# Patient Record
Sex: Female | Born: 1940 | Race: Black or African American | Hispanic: No | Smoking: Former smoker
Health system: Southern US, Community
[De-identification: ages and names within clinical notes are randomized; demographics above are authoritative.]

## PROBLEM LIST (undated history)

## (undated) DIAGNOSIS — E119 Type 2 diabetes mellitus without complications: Secondary | ICD-10-CM

## (undated) DIAGNOSIS — I10 Essential (primary) hypertension: Secondary | ICD-10-CM

## (undated) DIAGNOSIS — J189 Pneumonia, unspecified organism: Secondary | ICD-10-CM

## (undated) DIAGNOSIS — O2230 Deep phlebothrombosis in pregnancy, unspecified trimester: Secondary | ICD-10-CM

## (undated) DIAGNOSIS — N289 Disorder of kidney and ureter, unspecified: Secondary | ICD-10-CM

## (undated) HISTORY — PX: OTHER SURGICAL HISTORY: SHX169

---

## 2004-11-01 ENCOUNTER — Emergency Department (HOSPITAL_COMMUNITY): Admission: EM | Admit: 2004-11-01 | Discharge: 2004-11-01 | Payer: Self-pay | Admitting: Emergency Medicine

## 2006-05-16 ENCOUNTER — Encounter: Admission: RE | Admit: 2006-05-16 | Discharge: 2006-05-16 | Payer: Self-pay | Admitting: Internal Medicine

## 2006-07-12 ENCOUNTER — Ambulatory Visit (HOSPITAL_COMMUNITY): Admission: RE | Admit: 2006-07-12 | Discharge: 2006-07-12 | Payer: Self-pay | Admitting: Gastroenterology

## 2006-07-12 ENCOUNTER — Encounter (INDEPENDENT_AMBULATORY_CARE_PROVIDER_SITE_OTHER): Payer: Self-pay | Admitting: Specialist

## 2009-08-10 ENCOUNTER — Inpatient Hospital Stay (HOSPITAL_COMMUNITY): Admission: RE | Admit: 2009-08-10 | Discharge: 2009-08-14 | Payer: Self-pay | Admitting: Urology

## 2010-03-21 ENCOUNTER — Encounter: Payer: Self-pay | Admitting: Urology

## 2010-05-16 LAB — GLUCOSE, CAPILLARY
Glucose-Capillary: 107 mg/dL — ABNORMAL HIGH (ref 70–99)
Glucose-Capillary: 111 mg/dL — ABNORMAL HIGH (ref 70–99)
Glucose-Capillary: 127 mg/dL — ABNORMAL HIGH (ref 70–99)
Glucose-Capillary: 82 mg/dL (ref 70–99)
Glucose-Capillary: 92 mg/dL (ref 70–99)
Glucose-Capillary: 92 mg/dL (ref 70–99)

## 2010-05-17 LAB — COMPREHENSIVE METABOLIC PANEL
Albumin: 4.3 g/dL (ref 3.5–5.2)
BUN: 17 mg/dL (ref 6–23)
CO2: 34 mEq/L — ABNORMAL HIGH (ref 19–32)
Calcium: 10.7 mg/dL — ABNORMAL HIGH (ref 8.4–10.5)
Chloride: 101 mEq/L (ref 96–112)
Creatinine, Ser: 1.05 mg/dL (ref 0.4–1.2)
GFR calc Af Amer: >60 mL/min (ref >60)
GFR calc non Af Amer: 52 mL/min — ABNORMAL LOW (ref >60)
Glucose, Bld: 84 mg/dL (ref 70–99)
Potassium: 4.5 mEq/L (ref 3.5–5.1)
Sodium: 143 mEq/L (ref 135–145)

## 2010-05-17 LAB — GLUCOSE, CAPILLARY
Glucose-Capillary: 109 mg/dL — ABNORMAL HIGH (ref 70–99)
Glucose-Capillary: 112 mg/dL — ABNORMAL HIGH (ref 70–99)
Glucose-Capillary: 153 mg/dL — ABNORMAL HIGH (ref 70–99)

## 2010-05-17 LAB — TYPE AND SCREEN: Antibody Screen: NEGATIVE

## 2010-05-17 LAB — BASIC METABOLIC PANEL
CO2: 26 mEq/L (ref 19–32)
Chloride: 103 mEq/L (ref 96–112)
GFR calc Af Amer: 58 mL/min — ABNORMAL LOW (ref >60)
Potassium: 4.3 mEq/L (ref 3.5–5.1)
Sodium: 134 mEq/L — ABNORMAL LOW (ref 135–145)

## 2010-05-17 LAB — CBC
HCT: 33.8 % — ABNORMAL LOW (ref 36.0–46.0)
HCT: 38.7 % (ref 36.0–46.0)
Hemoglobin: 11.2 g/dL — ABNORMAL LOW (ref 12.0–15.0)
Hemoglobin: 12.4 g/dL (ref 12.0–15.0)
MCHC: 33 g/dL (ref 30.0–36.0)
MCV: 86.4 fL (ref 78.0–100.0)
RBC: 3.92 MIL/uL (ref 3.87–5.11)
RBC: 4.42 MIL/uL (ref 3.87–5.11)
RDW: 13.4 % (ref 11.5–15.5)

## 2010-05-17 LAB — ABO/RH: ABO/RH(D): O POS

## 2010-07-13 NOTE — Op Note (Signed)
[name], Glenda Blankenship                ACCOUNT NO.:  [id number]   MEDICAL RECORD NO.:  [id number]          PATIENT TYPE:  AMB   LOCATION:  ENDO                         FACILITY:  [facility]   PHYSICIAN:  Anselmo Rod, M.D.  DATE OF BIRTH:  1940/12/22   DATE OF PROCEDURE:  07/12/2006  DATE OF DISCHARGE:                               OPERATIVE REPORT   PROCEDURE PERFORMED:  Colonoscopy with snare polypectomy x2.   ENDOSCOPIST:  Anselmo Rod, M.D.   INSTRUMENT USED:  Pentax video colonoscope.   INDICATIONS FOR PROCEDURE:  The patient is a 70 year old African-  American female undergoing screening colonoscopy.  Rule out colonic  polyps, masses, etc.   PREPROCEDURE PREPARATION:  Informed consent was procured from the  patient.  The patient was fasted for eight hours prior to the procedure  and prepped with a gallon of Nulytely the night prior to the procedure.  The risks and benefits of the procedure including a 10% miss rate for  cancer or polyps was discussed with the patient as well.   PREPROCEDURE PHYSICAL:  The patient had stable vital signs.  Neck  supple.  Chest clear to auscultation.  S1 and S2 regular.  No murmurs,  rubs, gallops, rales, rhonchi or wheezing.  Abdomen soft with normal  bowel sounds.   DESCRIPTION OF PROCEDURE:  The patient was placed in left lateral  decubitus position and sedated with 70 mcg of Fentanyl and 7 mg of  Versed in slow incremental doses.  Once the patient was adequately  sedated and maintained on low flow oxygen and continuous cardiac  monitoring, the Pentax video colonoscope was advanced from the rectum to  the cecum.  The appendicular orifice and ileocecal valve were clearly  visualized and photographed.  Two small sessile polyps were removed by  hot snare from the proximal right colon.  The rest of the exam was  unremarkable.  There was some residual stool in the colon and multiple  washes were done.  Retroflexion in rectum revealed no  abnormalities.   IMPRESSION:  1. Two small sessile polyps removed by hot snare from proximal right      colon.  Otherwise normal exam to cecum.  2. There was some residual stool in the colon, very small lesions      could be missed.  Multiple washes were done.  3. No evidence of diverticulosis.   RECOMMENDATIONS:  1. Await pathology results.  2. Avoid all nonsteroidals including aspirin for the next two weeks.  3. Repeat colonoscopy depending on pathology results.  4. Outpatient followup as need arises in the future.      Anselmo Rod, M.D.  Electronically Signed     JNM/MEDQ  D:  07/12/2006  T:  07/12/2006  Job:  [id number]   cc:   Olene Craven, M.D.

## 2014-03-24 DIAGNOSIS — E1121 Type 2 diabetes mellitus with diabetic nephropathy: Secondary | ICD-10-CM | POA: Diagnosis not present

## 2014-03-24 DIAGNOSIS — I129 Hypertensive chronic kidney disease with stage 1 through stage 4 chronic kidney disease, or unspecified chronic kidney disease: Secondary | ICD-10-CM | POA: Diagnosis not present

## 2014-03-24 DIAGNOSIS — N08 Glomerular disorders in diseases classified elsewhere: Secondary | ICD-10-CM | POA: Diagnosis not present

## 2014-03-24 DIAGNOSIS — Z79899 Other long term (current) drug therapy: Secondary | ICD-10-CM | POA: Diagnosis not present

## 2014-03-24 DIAGNOSIS — N182 Chronic kidney disease, stage 2 (mild): Secondary | ICD-10-CM | POA: Diagnosis not present

## 2016-01-07 ENCOUNTER — Emergency Department (HOSPITAL_COMMUNITY): Payer: Medicare Other

## 2016-01-07 ENCOUNTER — Encounter (HOSPITAL_COMMUNITY): Payer: Self-pay

## 2016-01-07 ENCOUNTER — Inpatient Hospital Stay (HOSPITAL_COMMUNITY)
Admission: EM | Admit: 2016-01-07 | Discharge: 2016-01-14 | DRG: 871 | Disposition: A | Discharge status: Home / Self Care | Payer: Medicare Other | Attending: Pulmonary Disease | Admitting: Pulmonary Disease

## 2016-01-07 DIAGNOSIS — Z87891 Personal history of nicotine dependence: Secondary | ICD-10-CM

## 2016-01-07 DIAGNOSIS — A419 Sepsis, unspecified organism: Secondary | ICD-10-CM | POA: Diagnosis not present

## 2016-01-07 DIAGNOSIS — I272 Pulmonary hypertension, unspecified: Secondary | ICD-10-CM

## 2016-01-07 DIAGNOSIS — I959 Hypotension, unspecified: Secondary | ICD-10-CM | POA: Diagnosis not present

## 2016-01-07 DIAGNOSIS — I214 Non-ST elevation (NSTEMI) myocardial infarction: Secondary | ICD-10-CM | POA: Diagnosis present

## 2016-01-07 DIAGNOSIS — J9601 Acute respiratory failure with hypoxia: Secondary | ICD-10-CM | POA: Diagnosis not present

## 2016-01-07 DIAGNOSIS — Z66 Do not resuscitate: Secondary | ICD-10-CM | POA: Diagnosis present

## 2016-01-07 DIAGNOSIS — R6521 Severe sepsis with septic shock: Secondary | ICD-10-CM | POA: Diagnosis present

## 2016-01-07 DIAGNOSIS — Z7982 Long term (current) use of aspirin: Secondary | ICD-10-CM

## 2016-01-07 DIAGNOSIS — N179 Acute kidney failure, unspecified: Secondary | ICD-10-CM

## 2016-01-07 DIAGNOSIS — A084 Viral intestinal infection, unspecified: Secondary | ICD-10-CM | POA: Diagnosis present

## 2016-01-07 DIAGNOSIS — E872 Acidosis, unspecified: Secondary | ICD-10-CM

## 2016-01-07 DIAGNOSIS — I82441 Acute embolism and thrombosis of right tibial vein: Secondary | ICD-10-CM | POA: Diagnosis present

## 2016-01-07 DIAGNOSIS — R579 Shock, unspecified: Secondary | ICD-10-CM | POA: Diagnosis present

## 2016-01-07 DIAGNOSIS — E1165 Type 2 diabetes mellitus with hyperglycemia: Secondary | ICD-10-CM | POA: Diagnosis present

## 2016-01-07 DIAGNOSIS — J9600 Acute respiratory failure, unspecified whether with hypoxia or hypercapnia: Secondary | ICD-10-CM

## 2016-01-07 DIAGNOSIS — E876 Hypokalemia: Secondary | ICD-10-CM | POA: Diagnosis not present

## 2016-01-07 DIAGNOSIS — I82431 Acute embolism and thrombosis of right popliteal vein: Secondary | ICD-10-CM | POA: Diagnosis present

## 2016-01-07 DIAGNOSIS — I2602 Saddle embolus of pulmonary artery with acute cor pulmonale: Secondary | ICD-10-CM

## 2016-01-07 DIAGNOSIS — E8729 Other acidosis: Secondary | ICD-10-CM

## 2016-01-07 DIAGNOSIS — R0600 Dyspnea, unspecified: Secondary | ICD-10-CM

## 2016-01-07 DIAGNOSIS — K529 Noninfective gastroenteritis and colitis, unspecified: Secondary | ICD-10-CM

## 2016-01-07 DIAGNOSIS — R571 Hypovolemic shock: Secondary | ICD-10-CM | POA: Diagnosis present

## 2016-01-07 DIAGNOSIS — E875 Hyperkalemia: Secondary | ICD-10-CM

## 2016-01-07 DIAGNOSIS — M109 Gout, unspecified: Secondary | ICD-10-CM | POA: Diagnosis present

## 2016-01-07 DIAGNOSIS — J90 Pleural effusion, not elsewhere classified: Secondary | ICD-10-CM

## 2016-01-07 DIAGNOSIS — I82409 Acute embolism and thrombosis of unspecified deep veins of unspecified lower extremity: Secondary | ICD-10-CM

## 2016-01-07 DIAGNOSIS — I313 Pericardial effusion (noninflammatory): Secondary | ICD-10-CM | POA: Diagnosis not present

## 2016-01-07 DIAGNOSIS — Z9889 Other specified postprocedural states: Secondary | ICD-10-CM

## 2016-01-07 DIAGNOSIS — D6489 Other specified anemias: Secondary | ICD-10-CM | POA: Diagnosis present

## 2016-01-07 DIAGNOSIS — I10 Essential (primary) hypertension: Secondary | ICD-10-CM | POA: Diagnosis present

## 2016-01-07 DIAGNOSIS — E8770 Fluid overload, unspecified: Secondary | ICD-10-CM | POA: Diagnosis not present

## 2016-01-07 HISTORY — DX: Type 2 diabetes mellitus without complications: E11.9

## 2016-01-07 LAB — CBC WITH DIFFERENTIAL/PLATELET
BASOS ABS: 0 10*3/uL (ref 0.0–0.1)
BASOS PCT: 0 %
EOS ABS: 0 10*3/uL (ref 0.0–0.7)
EOS PCT: 0 %
HCT: 35.7 % — ABNORMAL LOW (ref 36.0–46.0)
Hemoglobin: 11 g/dL — ABNORMAL LOW (ref 12.0–15.0)
Lymphocytes Relative: 26 %
Lymphs Abs: 2.2 10*3/uL (ref 0.7–4.0)
MCH: 28.1 pg (ref 26.0–34.0)
MCHC: 30.8 g/dL (ref 30.0–36.0)
MCV: 91.3 fL (ref 78.0–100.0)
Monocytes Absolute: 0.6 10*3/uL (ref 0.1–1.0)
Monocytes Relative: 8 %
Neutro Abs: 5.5 10*3/uL (ref 1.7–7.7)
Neutrophils Relative %: 66 %
PLATELETS: 192 10*3/uL (ref 150–400)
RBC: 3.91 MIL/uL (ref 3.87–5.11)
RDW: 14.1 % (ref 11.5–15.5)
WBC: 8.3 10*3/uL (ref 4.0–10.5)

## 2016-01-07 LAB — CBG MONITORING, ED
GLUCOSE-CAPILLARY: 110 mg/dL — AB (ref 65–99)
GLUCOSE-CAPILLARY: 156 mg/dL — AB (ref 65–99)

## 2016-01-07 LAB — BLOOD GAS, VENOUS
ACID-BASE DEFICIT: 5.5 mmol/L — AB (ref 0.0–2.0)
BICARBONATE: 21.3 mmol/L (ref 20.0–28.0)
FIO2: 100
O2 Content: 15 L/min
O2 Saturation: 9.8 %
Patient temperature: 101.6
pCO2, Ven: 55.1 mmHg (ref 44.0–60.0)
pH, Ven: 7.224 — ABNORMAL LOW (ref 7.250–7.430)

## 2016-01-07 LAB — COMPREHENSIVE METABOLIC PANEL
ALT: 26 U/L (ref 14–54)
AST: 53 U/L — AB (ref 15–41)
Albumin: 3.5 g/dL (ref 3.5–5.0)
Alkaline Phosphatase: 66 U/L (ref 38–126)
Anion gap: 9 (ref 5–15)
BILIRUBIN TOTAL: 0.5 mg/dL (ref 0.3–1.2)
BUN: 25 mg/dL — AB (ref 6–20)
CO2: 22 mmol/L (ref 22–32)
CREATININE: 2.32 mg/dL — AB (ref 0.44–1.00)
Calcium: 8.5 mg/dL — ABNORMAL LOW (ref 8.9–10.3)
Chloride: 108 mmol/L (ref 101–111)
GFR calc Af Amer: 23 mL/min — ABNORMAL LOW (ref >60)
GFR, EST NON AFRICAN AMERICAN: 19 mL/min — AB (ref >60)
Glucose, Bld: 165 mg/dL — ABNORMAL HIGH (ref 65–99)
POTASSIUM: 5.6 mmol/L — AB (ref 3.5–5.1)
Sodium: 139 mmol/L (ref 135–145)
TOTAL PROTEIN: 6.9 g/dL (ref 6.5–8.1)

## 2016-01-07 LAB — PROTIME-INR
INR: 1.08
Prothrombin Time: 14 seconds (ref 11.4–15.2)

## 2016-01-07 LAB — TYPE AND SCREEN
ABO/RH(D): O POS
ANTIBODY SCREEN: NEGATIVE

## 2016-01-07 LAB — I-STAT CG4 LACTIC ACID, ED: LACTIC ACID, VENOUS: 5.68 mmol/L — AB (ref 0.5–1.9)

## 2016-01-07 MED ORDER — SODIUM CHLORIDE 0.9 % IV BOLUS (SEPSIS)
3000.0000 mL | Freq: Once | INTRAVENOUS | Status: AC
  Administered 2016-01-07: 3000 mL via INTRAVENOUS

## 2016-01-07 MED ORDER — LACTATED RINGERS IV BOLUS (SEPSIS)
1000.0000 mL | Freq: Once | INTRAVENOUS | Status: AC
  Administered 2016-01-08: 1000 mL via INTRAVENOUS

## 2016-01-07 MED ORDER — CEFTRIAXONE SODIUM 1 G IJ SOLR
1.0000 g | Freq: Once | INTRAMUSCULAR | Status: AC
  Administered 2016-01-07: 1 g via INTRAVENOUS
  Filled 2016-01-07: qty 10

## 2016-01-07 MED ORDER — ACETAMINOPHEN 650 MG RE SUPP
650.0000 mg | Freq: Once | RECTAL | Status: AC
  Administered 2016-01-07: 650 mg via RECTAL
  Filled 2016-01-07: qty 1

## 2016-01-07 MED ORDER — ONDANSETRON HCL 4 MG/2ML IJ SOLN: 4.0000 mg | Freq: Once | INTRAMUSCULAR | Status: DC | PRN

## 2016-01-07 NOTE — ED Notes (Signed)
Family at bedside. MD to come and update.

## 2016-01-07 NOTE — ED Notes (Signed)
ED Glenda Blankenship at bedside. 

## 2016-01-07 NOTE — ED Triage Notes (Signed)
BIB EMS from Home w/ c/o emesis, diarrhea, and hypotension. EMS reports systolic palpated of 70.

## 2016-01-07 NOTE — ED Notes (Signed)
Bed: WA21 Expected date:  Expected time:  Means of arrival:  Comments: EMS nausea and vomiting IVF's

## 2016-01-08 ENCOUNTER — Inpatient Hospital Stay (HOSPITAL_COMMUNITY): Payer: Medicare Other

## 2016-01-08 DIAGNOSIS — I82431 Acute embolism and thrombosis of right popliteal vein: Secondary | ICD-10-CM | POA: Diagnosis present

## 2016-01-08 DIAGNOSIS — R571 Hypovolemic shock: Secondary | ICD-10-CM

## 2016-01-08 DIAGNOSIS — J9601 Acute respiratory failure with hypoxia: Secondary | ICD-10-CM | POA: Diagnosis not present

## 2016-01-08 DIAGNOSIS — E1165 Type 2 diabetes mellitus with hyperglycemia: Secondary | ICD-10-CM | POA: Diagnosis present

## 2016-01-08 DIAGNOSIS — A084 Viral intestinal infection, unspecified: Secondary | ICD-10-CM | POA: Diagnosis present

## 2016-01-08 DIAGNOSIS — I313 Pericardial effusion (noninflammatory): Secondary | ICD-10-CM | POA: Diagnosis not present

## 2016-01-08 DIAGNOSIS — I272 Pulmonary hypertension, unspecified: Secondary | ICD-10-CM | POA: Diagnosis present

## 2016-01-08 DIAGNOSIS — Z66 Do not resuscitate: Secondary | ICD-10-CM | POA: Diagnosis present

## 2016-01-08 DIAGNOSIS — R579 Shock, unspecified: Secondary | ICD-10-CM | POA: Diagnosis present

## 2016-01-08 DIAGNOSIS — K529 Noninfective gastroenteritis and colitis, unspecified: Secondary | ICD-10-CM | POA: Diagnosis not present

## 2016-01-08 DIAGNOSIS — E8729 Other acidosis: Secondary | ICD-10-CM

## 2016-01-08 DIAGNOSIS — N179 Acute kidney failure, unspecified: Secondary | ICD-10-CM

## 2016-01-08 DIAGNOSIS — M109 Gout, unspecified: Secondary | ICD-10-CM | POA: Diagnosis present

## 2016-01-08 DIAGNOSIS — R0600 Dyspnea, unspecified: Secondary | ICD-10-CM | POA: Diagnosis not present

## 2016-01-08 DIAGNOSIS — J90 Pleural effusion, not elsewhere classified: Secondary | ICD-10-CM | POA: Diagnosis not present

## 2016-01-08 DIAGNOSIS — Z7982 Long term (current) use of aspirin: Secondary | ICD-10-CM | POA: Diagnosis not present

## 2016-01-08 DIAGNOSIS — R6521 Severe sepsis with septic shock: Secondary | ICD-10-CM | POA: Diagnosis present

## 2016-01-08 DIAGNOSIS — E876 Hypokalemia: Secondary | ICD-10-CM | POA: Diagnosis not present

## 2016-01-08 DIAGNOSIS — I2602 Saddle embolus of pulmonary artery with acute cor pulmonale: Secondary | ICD-10-CM | POA: Diagnosis present

## 2016-01-08 DIAGNOSIS — E872 Acidosis, unspecified: Secondary | ICD-10-CM

## 2016-01-08 DIAGNOSIS — D6489 Other specified anemias: Secondary | ICD-10-CM | POA: Diagnosis present

## 2016-01-08 DIAGNOSIS — I82491 Acute embolism and thrombosis of other specified deep vein of right lower extremity: Secondary | ICD-10-CM | POA: Diagnosis not present

## 2016-01-08 DIAGNOSIS — I214 Non-ST elevation (NSTEMI) myocardial infarction: Secondary | ICD-10-CM | POA: Diagnosis not present

## 2016-01-08 DIAGNOSIS — A419 Sepsis, unspecified organism: Secondary | ICD-10-CM | POA: Diagnosis present

## 2016-01-08 DIAGNOSIS — I959 Hypotension, unspecified: Secondary | ICD-10-CM | POA: Diagnosis present

## 2016-01-08 DIAGNOSIS — I2609 Other pulmonary embolism with acute cor pulmonale: Secondary | ICD-10-CM | POA: Diagnosis not present

## 2016-01-08 DIAGNOSIS — E8770 Fluid overload, unspecified: Secondary | ICD-10-CM | POA: Diagnosis not present

## 2016-01-08 DIAGNOSIS — I2699 Other pulmonary embolism without acute cor pulmonale: Secondary | ICD-10-CM | POA: Diagnosis not present

## 2016-01-08 DIAGNOSIS — E875 Hyperkalemia: Secondary | ICD-10-CM | POA: Diagnosis present

## 2016-01-08 DIAGNOSIS — I82441 Acute embolism and thrombosis of right tibial vein: Secondary | ICD-10-CM | POA: Diagnosis present

## 2016-01-08 DIAGNOSIS — I10 Essential (primary) hypertension: Secondary | ICD-10-CM | POA: Diagnosis present

## 2016-01-08 DIAGNOSIS — Z87891 Personal history of nicotine dependence: Secondary | ICD-10-CM | POA: Diagnosis not present

## 2016-01-08 DIAGNOSIS — R0602 Shortness of breath: Secondary | ICD-10-CM | POA: Diagnosis not present

## 2016-01-08 LAB — GLUCOSE, CAPILLARY
Glucose-Capillary: 100 mg/dL — ABNORMAL HIGH (ref 65–99)
Glucose-Capillary: 120 mg/dL — ABNORMAL HIGH (ref 65–99)
Glucose-Capillary: 158 mg/dL — ABNORMAL HIGH (ref 65–99)
Glucose-Capillary: 161 mg/dL — ABNORMAL HIGH (ref 65–99)
Glucose-Capillary: 76 mg/dL (ref 65–99)
Glucose-Capillary: 92 mg/dL (ref 65–99)

## 2016-01-08 LAB — PHOSPHORUS: Phosphorus: 2.8 mg/dL (ref 2.5–4.6)

## 2016-01-08 LAB — GASTROINTESTINAL PANEL BY PCR, STOOL (REPLACES STOOL CULTURE)

## 2016-01-08 LAB — BASIC METABOLIC PANEL
Anion gap: 16 — ABNORMAL HIGH (ref 5–15)
Anion gap: 12 (ref 5–15)
BUN: 26 mg/dL — AB (ref 6–20)
BUN: 28 mg/dL — AB (ref 6–20)
CALCIUM: 7.6 mg/dL — AB (ref 8.9–10.3)
CO2: 16 mmol/L — ABNORMAL LOW (ref 22–32)
CO2: 21 mmol/L — ABNORMAL LOW (ref 22–32)
CREATININE: 2.38 mg/dL — AB (ref 0.44–1.00)
Calcium: 8.2 mg/dL — ABNORMAL LOW (ref 8.9–10.3)
Chloride: 109 mmol/L (ref 101–111)
Chloride: 108 mmol/L (ref 101–111)
Creatinine, Ser: 2.32 mg/dL — ABNORMAL HIGH (ref 0.44–1.00)
GFR calc Af Amer: 23 mL/min — ABNORMAL LOW (ref >60)
GFR calc Af Amer: 22 mL/min — ABNORMAL LOW (ref >60)
GFR, EST NON AFRICAN AMERICAN: 19 mL/min — AB (ref >60)
GFR, EST NON AFRICAN AMERICAN: 19 mL/min — AB (ref >60)
GLUCOSE: 116 mg/dL — AB (ref 65–99)
GLUCOSE: 148 mg/dL — AB (ref 65–99)
POTASSIUM: 5.8 mmol/L — AB (ref 3.5–5.1)
Potassium: 4.6 mmol/L (ref 3.5–5.1)
Sodium: 141 mmol/L (ref 135–145)
Sodium: 141 mmol/L (ref 135–145)

## 2016-01-08 LAB — I-STAT CG4 LACTIC ACID, ED: LACTIC ACID, VENOUS: 9.11 mmol/L — AB (ref 0.5–1.9)

## 2016-01-08 LAB — CBC
HCT: 36.9 % (ref 36.0–46.0)
Hemoglobin: 11.4 g/dL — ABNORMAL LOW (ref 12.0–15.0)
MCH: 28 pg (ref 26.0–34.0)
MCHC: 30.9 g/dL (ref 30.0–36.0)
MCV: 90.7 fL (ref 78.0–100.0)
PLATELETS: 135 10*3/uL — AB (ref 150–400)
RBC: 4.07 MIL/uL (ref 3.87–5.11)
RDW: 14 % (ref 11.5–15.5)
WBC: 9.8 10*3/uL (ref 4.0–10.5)

## 2016-01-08 LAB — LACTIC ACID, PLASMA
LACTIC ACID, VENOUS: 10.7 mmol/L — AB (ref 0.5–1.9)
LACTIC ACID, VENOUS: 4.5 mmol/L — AB (ref 0.5–1.9)
LACTIC ACID, VENOUS: 2.9 mmol/L — AB (ref 0.5–1.9)

## 2016-01-08 LAB — TROPONIN I
TROPONIN I: 2.11 ng/mL — AB (ref <0.03)
TROPONIN I: 2.14 ng/mL — AB (ref <0.03)
Troponin I: 2.7 ng/mL (ref <0.03)

## 2016-01-08 LAB — BLOOD GAS, VENOUS
Acid-base deficit: 13.8 mmol/L — ABNORMAL HIGH (ref 0.0–2.0)
Bicarbonate: 15.6 mmol/L — ABNORMAL LOW (ref 20.0–28.0)
O2 Content: 2 L/min
O2 SAT: 23.7 %
PCO2 VEN: 52.2 mmHg (ref 44.0–60.0)
Patient temperature: 98.6
pH, Ven: 7.102 — CL (ref 7.250–7.430)

## 2016-01-08 LAB — C DIFFICILE QUICK SCREEN W PCR REFLEX
C DIFFICILE (CDIFF) TOXIN: NEGATIVE
C DIFFICLE (CDIFF) ANTIGEN: NEGATIVE
C Diff interpretation: NOT DETECTED

## 2016-01-08 LAB — PROCALCITONIN: Procalcitonin: 1.84 ng/mL

## 2016-01-08 LAB — MAGNESIUM: Magnesium: 1.5 mg/dL — ABNORMAL LOW (ref 1.7–2.4)

## 2016-01-08 LAB — MRSA PCR SCREENING: MRSA by PCR: NEGATIVE

## 2016-01-08 MED ORDER — PHENYLEPHRINE HCL 10 MG/ML IJ SOLN
30.0000 ug/min | INTRAVENOUS | Status: DC
  Administered 2016-01-08: 30 ug/min via INTRAVENOUS
  Filled 2016-01-08 (×3): qty 1

## 2016-01-08 MED ORDER — ASPIRIN EC 81 MG PO TBEC
81.0000 mg | DELAYED_RELEASE_TABLET | Freq: Every day | ORAL | Status: DC
  Administered 2016-01-08 – 2016-01-14 (×7): 81 mg via ORAL
  Filled 2016-01-08 (×7): qty 1

## 2016-01-08 MED ORDER — INSULIN ASPART 100 UNIT/ML ~~LOC~~ SOLN
2.0000 [IU] | SUBCUTANEOUS | Status: DC
  Administered 2016-01-08 – 2016-01-09 (×3): 4 [IU] via SUBCUTANEOUS
  Administered 2016-01-09 (×2): 2 [IU] via SUBCUTANEOUS
  Administered 2016-01-10 (×2): 4 [IU] via SUBCUTANEOUS
  Administered 2016-01-10: 2 [IU] via SUBCUTANEOUS

## 2016-01-08 MED ORDER — INSULIN ASPART 100 UNIT/ML IV SOLN
10.0000 [IU] | Freq: Once | INTRAVENOUS | Status: AC
  Administered 2016-01-08: 10 [IU] via INTRAVENOUS
  Filled 2016-01-08: qty 0.1

## 2016-01-08 MED ORDER — IOPAMIDOL (ISOVUE-300) INJECTION 61%
15.0000 mL | Freq: Once | INTRAVENOUS | Status: DC | PRN
  Administered 2016-01-08: 15 mL via ORAL
  Filled 2016-01-08: qty 30

## 2016-01-08 MED ORDER — ACETAMINOPHEN 325 MG PO TABS
650.0000 mg | ORAL_TABLET | ORAL | Status: DC | PRN
  Administered 2016-01-08 – 2016-01-09 (×2): 650 mg via ORAL
  Filled 2016-01-08 (×2): qty 2

## 2016-01-08 MED ORDER — SODIUM CHLORIDE 0.9 % IV SOLN: 250.0000 mL | INTRAVENOUS | Status: DC | PRN

## 2016-01-08 MED ORDER — DEXTROSE 5 % IV SOLN
1.0000 g | Freq: Once | INTRAVENOUS | Status: AC
  Administered 2016-01-08: 1 g via INTRAVENOUS
  Filled 2016-01-08: qty 10

## 2016-01-08 MED ORDER — HEPARIN SODIUM (PORCINE) 5000 UNIT/ML IJ SOLN
5000.0000 [IU] | Freq: Three times a day (TID) | INTRAMUSCULAR | Status: DC
  Administered 2016-01-08 – 2016-01-12 (×13): 5000 [IU] via SUBCUTANEOUS
  Filled 2016-01-08 (×13): qty 1

## 2016-01-08 MED ORDER — SODIUM CHLORIDE 0.9 % IV BOLUS (SEPSIS)
1000.0000 mL | Freq: Once | INTRAVENOUS | Status: AC
  Administered 2016-01-08: 1000 mL via INTRAVENOUS

## 2016-01-08 MED ORDER — SODIUM BICARBONATE 8.4 % IV SOLN
100.0000 meq | Freq: Once | INTRAVENOUS | Status: AC
  Administered 2016-01-08: 100 meq via INTRAVENOUS
  Filled 2016-01-08: qty 50

## 2016-01-08 MED ORDER — SODIUM CHLORIDE 0.9 % IV SOLN
INTRAVENOUS | Status: DC
  Administered 2016-01-08: 04:00:00 via INTRAVENOUS

## 2016-01-08 MED ORDER — DEXTROSE 50 % IV SOLN
1.0000 | Freq: Once | INTRAVENOUS | Status: AC
  Administered 2016-01-08: 50 mL via INTRAVENOUS
  Filled 2016-01-08: qty 50

## 2016-01-08 MED ORDER — IPRATROPIUM-ALBUTEROL 0.5-2.5 (3) MG/3ML IN SOLN: 3.0000 mL | Freq: Four times a day (QID) | RESPIRATORY_TRACT | Status: DC | PRN

## 2016-01-08 MED ORDER — DEXTROSE 5 % IV SOLN
2.0000 g | INTRAVENOUS | Status: DC
  Administered 2016-01-08 – 2016-01-13 (×6): 2 g via INTRAVENOUS
  Filled 2016-01-08 (×8): qty 2

## 2016-01-08 MED ORDER — VANCOMYCIN HCL IN DEXTROSE 1-5 GM/200ML-% IV SOLN
1000.0000 mg | Freq: Once | INTRAVENOUS | Status: AC
  Administered 2016-01-08: 1000 mg via INTRAVENOUS
  Filled 2016-01-08: qty 200

## 2016-01-08 MED ORDER — ASPIRIN 81 MG PO CHEW: 324.0000 mg | CHEWABLE_TABLET | ORAL | Status: DC

## 2016-01-08 MED ORDER — SODIUM CHLORIDE 0.9 % IV SOLN
1.0000 g | Freq: Once | INTRAVENOUS | Status: AC
  Administered 2016-01-08: 1 g via INTRAVENOUS
  Filled 2016-01-08 (×2): qty 10

## 2016-01-08 MED ORDER — ORAL CARE MOUTH RINSE
15.0000 mL | Freq: Two times a day (BID) | OROMUCOSAL | Status: DC
  Administered 2016-01-08 – 2016-01-13 (×8): 15 mL via OROMUCOSAL

## 2016-01-08 MED ORDER — SODIUM BICARBONATE 8.4 % IV SOLN
INTRAVENOUS | Status: DC
  Administered 2016-01-08: 08:00:00 via INTRAVENOUS
  Filled 2016-01-08: qty 150

## 2016-01-08 MED ORDER — LIP MEDEX EX OINT
TOPICAL_OINTMENT | CUTANEOUS | Status: AC
  Administered 2016-01-08: 17:00:00
  Filled 2016-01-08: qty 7

## 2016-01-08 MED ORDER — PANTOPRAZOLE SODIUM 40 MG PO TBEC
40.0000 mg | DELAYED_RELEASE_TABLET | Freq: Every day | ORAL | Status: DC
  Administered 2016-01-08 – 2016-01-10 (×3): 40 mg via ORAL
  Filled 2016-01-08 (×4): qty 1

## 2016-01-08 MED ORDER — CHLORHEXIDINE GLUCONATE 0.12 % MT SOLN
15.0000 mL | Freq: Two times a day (BID) | OROMUCOSAL | Status: DC
  Administered 2016-01-08 – 2016-01-14 (×12): 15 mL via OROMUCOSAL
  Filled 2016-01-08 (×11): qty 15

## 2016-01-08 MED ORDER — HEPARIN SODIUM (PORCINE) 1000 UNIT/ML DIALYSIS
1000.0000 [IU] | INTRAMUSCULAR | Status: DC | PRN
  Administered 2016-01-08: 2400 [IU] via INTRAVENOUS
  Filled 2016-01-08: qty 4
  Filled 2016-01-08: qty 6

## 2016-01-08 MED ORDER — ASPIRIN 300 MG RE SUPP: 300.0000 mg | RECTAL | Status: DC

## 2016-01-08 NOTE — ED Notes (Signed)
Pt has yet to produce more than a drop of urine in the catheter. MD aware.

## 2016-01-08 NOTE — Care Management Note (Signed)
Case Management Note  Patient Details  Name: Glenda Blankenship MRN: [id number] Date of Birth: 09/04/1940  Subjective/Objective:          Hypotensive status          Action/Plan: From home lives alone with support of her adult children Date:  January 08, 2016 Chart reviewed for concurrent status and case management needs. Will continue to follow patient progress: Discharge Planning: following for needs Expected discharge date: [id number] Marcelle SmilingRhonda Davis, BSN, [place], [place]   [phone]  Expected Discharge Date:                  Expected Discharge Plan:  Home/Self Care  In-House Referral:     Discharge planning Services     Post Acute Care Choice:    Choice offered to:     DME Arranged:    DME Agency:     HH Arranged:    HH Agency:     Status of Service:  In process, will continue to follow  If discussed at Long Length of Stay Meetings, dates discussed:    Additional Comments:  Golda AcreDavis, Rhonda Lynn, RN 01/08/2016, 9:46 AM

## 2016-01-08 NOTE — Progress Notes (Signed)
CRITICAL VALUE ALERT  Critical value received:  Lactic acid 2.9  Date of notification: 01/08/16  Time of notification:  1523  Critical value read back:Yes.    Nurse who received alert:  Lenard Forthshanda Kyleigha Markert, rN  MD notified (1st page):  Isaiah SergeMannam, MD  Time of first page:  [phone]

## 2016-01-08 NOTE — Progress Notes (Signed)
CRITICAL VALUE ALERT  Critical value received:  Lactic acid 4.5  Date of notification:  01/08/16  Time of notification:  1125  Critical value read back:Yes.    Nurse who received alert:  Josephina ShihShanda Nadine Ryle, RN  MD notified (1st page):  Dr. Vassie LollAlva  Time of first page:  1125

## 2016-01-08 NOTE — Procedures (Signed)
Hemodialysis Catheter Insertion Procedure Note Glenda ChenMarie J Blankenship [id number] 03/17/1940  Procedure: Insertion of Hemodialysis Catheter Indications: Hemodialysis  Procedure Details Consent: Risks of procedure as well as the alternatives and risks of each were explained to the (patient/caregiver).  Consent for procedure obtained.  Time Out: Verified patient identification, verified procedure, site/side was marked, verified correct patient position, special equipment/implants available, medications/allergies/relevent history reviewed, required imaging and test results available.  Performed  Maximum sterile technique was used including antiseptics, cap, gloves, gown, hand hygiene, mask and sheet.  Skin prep: Chlorhexidine; local anesthetic administered  A Trialysis HD catheter was placed in the right internal jugular vein using the Seldinger technique.  Evaluation Blood flow good Complications: No apparent complications Patient did tolerate procedure well. Chest X-ray ordered to verify placement.  CXR: pending.   Procedure performed under direct supervision of Dr. Vassie LollAlva and with ultrasound guidance for real time vessel cannulation.     Glenda BrimBrandi Zaylon Bossier, NP-C [facility] Pgr: [phone] or [phone] 01/08/2016, 10:25 AM

## 2016-01-08 NOTE — Progress Notes (Signed)
eLink Physician-Brief Progress Note Patient Name: Glenda Blankenship DOB: 12/16/1940 MRN: [id number]   Date of Service  01/08/2016  HPI/Events of Note  Wheezy, dyspnea, O2 sats Ok on 2 Lt [place] Last CVP 19, CXR with mild CHF from earlier today Off neo from earlier today  eICU Interventions  Duo nebs, repeat CXR and CVP Will give lasix if there is more evidence of CHF     Intervention Category Evaluation Type: Other  Benjerman Molinelli 01/08/2016, 3:28 PM

## 2016-01-08 NOTE — H&P (Signed)
PULMONARY / CRITICAL CARE MEDICINE   Name: Glenda Blankenship MRN: [id number] DOB: [birth year]    ADMISSION DATE:  01/07/2016 CONSULTATION DATE:  11/9  REFERRING MD:  nanavati  CHIEF COMPLAINT:  hypotension  HISTORY OF PRESENT ILLNESS:   75 year old female with past medical history of diabetes and hypertension. She was in her usual state of health until the morning of 01/07/2016 when she awoke with a feeling of malaise. She proceeded to have several episodes of diarrhea (estimated 15 episodes) throughout the course of the day. Initially it was yellowish in color but eventually progressed to very watery diarrhea. She complained of nausea, however, she did not experience any vomiting. For dinner the night before she ate cabbage, potatoes, and chicken. She did not check to check in with a meat thermometer to ensure internal temperature had reached a safe range. She denied abdominal pain. As the day went on she began to experience lightheadedness and dizziness when she would stand up. For these reasons she presented to the emergency department [facility]. In the emergency department she was found to be severely hypotensive with systolic pressure getting to be as low as 54. She was aggressively resuscitated with IV fluids. Initial lactic acid 5.68. Additional laboratory evaluation significant for WBC 8.3, hemoglobin 11, potassium 5.6, serum creatinine 2.3 with unknown baseline, and glucose 165. She was having chills at home, but no fever noted in the emergency department. She was started on broad-spectrum antibiotics. After 4 L of IV fluid resuscitation she remained hypotensive. PCCM asked to admit. Of note she denies recent antibiotic use.  PAST MEDICAL HISTORY :  She  has a past medical history of Diabetes (HCC).  PAST SURGICAL HISTORY: She  has no past surgical history on file.  No Known Allergies  No current facility-administered medications on file prior to encounter.    No current  outpatient prescriptions on file prior to encounter.    FAMILY HISTORY:  Her has no family status information on file.    SOCIAL HISTORY: She  reports that she has quit smoking. She has never used smokeless tobacco.  REVIEW OF SYSTEMS:   Bolds are positive  Constitutional: weight loss, gain, night sweats, Fevers, chills, fatigue .  HEENT: headaches, Sore throat, sneezing, nasal congestion, post nasal drip, Difficulty swallowing, Tooth/dental problems, visual complaints visual changes, ear ache CV:  chest pain, radiates: ,Orthopnea, PND, swelling in lower extremities, dizziness, palpitations, syncope.  GI  heartburn, indigestion, abdominal pain, nausea, vomiting, diarrhea, change in bowel habits, loss of appetite, bloody stools.  Resp: cough, productive: , hemoptysis, dyspnea, chest pain, pleuritic.  Skin: rash or itching or icterus GU: dysuria, change in color of urine, urgency or frequency. flank pain, hematuria  MS: joint pain or swelling. decreased range of motion  Psych: change in mood or affect. depression or anxiety.  Neuro: difficulty with speech, weakness, numbness, ataxia    SUBJECTIVE:    VITAL SIGNS: BP (!) 86/55   Pulse 83   Temp 99.1 F (37.3 C)   Resp (!) 28   Ht 5\' 2"  (1.575 m)   Wt 72.6 kg (160 lb)   SpO2 98%   BMI 29.26 kg/m   HEMODYNAMICS:    VENTILATOR SETTINGS: Vent Mode: BIPAP;PCV FiO2 (%):  [100 %] 100 % Set Rate:  [15 bmp] 15 bmp PEEP:  [8 cmH20] 8 cmH20  INTAKE / OUTPUT: No intake/output data recorded.  PHYSICAL EXAMINATION: General:  Pleasant elderly female in NAD Neuro:  Alert, oriented, non-focal HEENT:  Fults/AT, PERRL, no JVD. MM dry Cardiovascular:  RRR, no MRG Lungs:  Clear posterior lung fields Abdomen:  Soft, non-tender, non-distended Musculoskeletal:  No acute deformity or ROM limitation Skin:  Grossly intact  LABS:  BMET  Recent Labs Lab 01/07/16 2230  NA 139  K 5.6*  CL 108  CO2 22  BUN 25*  CREATININE 2.32*   GLUCOSE 165*    Electrolytes  Recent Labs Lab 01/07/16 2230  CALCIUM 8.5*    CBC  Recent Labs Lab 01/07/16 2230  WBC 8.3  HGB 11.0*  HCT 35.7*  PLT 192    Coag's  Recent Labs Lab 01/07/16 2230  INR 1.08    Sepsis Markers  Recent Labs Lab 01/07/16 2242  LATICACIDVEN 5.68*    ABG No results for input(s): PHART, PCO2ART, PO2ART in the last 168 hours.  Liver Enzymes  Recent Labs Lab 01/07/16 2230  AST 53*  ALT 26  ALKPHOS 66  BILITOT 0.5  ALBUMIN 3.5    Cardiac Enzymes No results for input(s): TROPONINI, PROBNP in the last 168 hours.  Glucose  Recent Labs Lab 01/07/16 2204 01/07/16 2229  GLUCAP 110* 156*    Imaging Dg Chest Port 1 View  Result Date: 01/07/2016 CLINICAL DATA:  Fever and vomiting.  Cough. EXAM: PORTABLE CHEST 1 VIEW COMPARISON:  None. FINDINGS: Normal heart size and pulmonary vascularity. Coarse central linear opacities with peribronchial thickening consistent with chronic bronchitis. No focal airspace disease or consolidation. No blunting of costophrenic angles. No pneumothorax. Old bilateral rib fractures. Calcification of the aorta. Degenerative changes in the spine. IMPRESSION: Chronic bronchitic changes. No evidence of active pulmonary disease. Electronically Signed   By: Burman Nieves M.D.   On: 01/07/2016 23:11    STUDIES:  KUB 11/10 >  CULTURES: BCx2 11/10 > Urine 11/10 > GI panel PCR 11/10 >  ANTIBIOTICS: Ceftriaxone 11/10 > Vancomycin 11/10   SIGNIFICANT EVENTS: 11/10 admit  LINES/TUBES:   DISCUSSION: 75 year old female with one-day history of severe diarrhea presenting to the emergency department with lightheadedness/dizziness. Will admit for shock favor hypovolemic in the setting of viral gastroenteritis, however, given that she may have eaten some undercooked chicken will also cover for bacterial. Have started low-dose phenylephrine, however, suspect that her blood pressure will improve with further  volume resuscitation as she is clinically dry on exam.  ASSESSMENT / PLAN:  PULMONARY A: Tachypnea - likely response to lactic acidosis  P:   ABG Supplemental O2 PRN to keep sats > 90%  CARDIOVASCULAR A:  Shock- Likely hypovolemic in the setting of diarrhea, also considering septic. Elevated lactic History of HTN  P:  Telemetry Hold home anti-hypertensive's MAP goal > 40mm/Hg, will add low dose neo to achieve this Continue IVF resuscitation. 4L in ED. Will order NS 250/hr x 4 hours. Then KVO Ensure lactic clearing If pressor demand worsens will place CVL and transition to norepinephrine  RENAL A:   AKI Hyperkalemia  P:   Repeat BMP  GASTROINTESTINAL A:   Gastroenteritis  P:  Clear liquid diet Protoinx PO  HEMATOLOGIC A:   Mild anemia  P:  Follow BMP Heparin for VTE ppx  INFECTIOUS A:   Gastroenteritis, likely viral  P:   CTX as above Follow blood cultures  ENDOCRINE A:   DM   P:   CBG monitoring and SSI  NEUROLOGIC A:   No acute issues  P:   Monitor   FAMILY  - Updates: patient and family updated in ED   - Inter-disciplinary  family meet or Palliative Care meeting due by:  11/16   Joneen RoachPaul Hoffman, AGACNP-BC [facility] Pager [phone] or [phone]  01/08/2016 2:09 AM   ATTENDING NOTE / ATTESTATION NOTE :   I have discussed the case with the resident/APP Joneen RoachPaul Hoffman.   I agree with the resident/APP's  history, physical examination, assessment, and plans.    I have edited the above note and modified it according to our agreed history, physical examination, assessment and plan.   Patient presented to the emergency room because of generalized weakness secondary to voluminous diarrhea. She presents with one-day history of profuse soft-watery, nonbloody diarrhea. Patient denies any history of severe diarrhea in the past. The last time she had antibiotics was 3 months ago for a mouth infection. She  lives by herself and no recent sick contacts. They had some food gathering over the weekend at church.   At the emergency room, her blood pressure was 50 systolic, heart rate mid 90s. She received fluid boluses, 4 L, by the time PCCM was consulted. She got a fifth liter of fluid and is currently on Neo-Synephrine at 35 mcg/kg/m. Blood pressures better. She is almost 6 L positive. Has not made any urine. Creatinine is elevated at 2.3 (unknown baseline), potassium starting to climb up and is currently 5.8. Bicarbonate is now 16. Lactic acid is now 11 from 7 on admission. Patient actually feels significant improved compared to when she came in. Denies any chest pain, abdominal pain. She was nauseated and has abdominal cramps. She is very thirsty and hungry.  Physical exam as above. Patient was seen, comfortably laying flat in bed. Not in distress. Blood pressure 90/60, heart rate 90s, respiratory rate 20s, 100% O2 saturation on nasal cannula. Temperature 97.5. Cranial nerves grossly intact. No lateralizing signs. Flat neck veins. No oral thrush. Good air entry in both lung fields, clear to auscultation. Good S1 and S2. No S3 murmur rub or gallop. Abdomen was soft, obese, nontender. No masses. Extremities revealed negative edema clubbing or cyanosis.  Labs reviewed and as mentioned. CXR w/o any acute abnormality.   Assessment : 1. Hypovolemic shock secondary to diarrhea. Likely infectious. No other views focus of infection. 2. Acute kidney injury secondary to hypovolemic shock on top of  allopurinol, diuretic, ACE inhibitor use. 3. Lactic acidosis secondary to above. Its possible, there is an ischemic process going on to explain lactic acidosis but less likely as pt is not too symptomatic and she feels 50% better compared to when she came in.   Plan : 1. CODE STATUS was discussed with the patient. She did not want to be intubated nor did she want chest compression. Limited DO NOT RESUSCITATE. May use  pressors. 2. Patient has gotten 5 L of fluid already. I think she is adequately resuscitated. With the metabolic acidosis, we'll switch her fluids to sodium bicarbonate. 3. We will give her medicines for hyperkalemia. We will give her calcium gluconate, insulin, D50, bicarbonate. I will hold off on Kayexalate given her diarrhea. Repeat electrolytes mid morning. If her numbers are worse, we need to discuss with her regarding whether she wants hemodialysis. I suspect she will say yes as this hopefully is a temporary thing. 4. Panculture. Check stool culture. Continue Rocephin.  No recent antibiotic use so less likely C. difficile colitis. 5. Check troponin. If elevated or if trending up, she may need to be on heparin drip for ongoing ischemia (?gut).  6. Watch out for congestion. 7. Advance diet  as tolerated 8. DVT prophylaxis.    I have spent 30  minutes of critical care time with this patient today.  Family :  No family at bedside.  I extensively discussed the plan of care with her. Family was around earlier in day were updated.   Pollie MeyerJ. Angelo A de Dios, MD 01/08/2016, 6:32 AM  Pulmonary and Critical Care Pager [phone] After 3 pm or if no answer, call [phone]

## 2016-01-08 NOTE — Progress Notes (Signed)
CRITICAL VALUE ALERT  Critical value received:  Troponin 2.11, Procalcitonin 1.84  Date of notification: 01/08/16  Time of notification: 0827  Critical value read back:Yes.    Nurse who received alert:  Josephina ShihShanda Jonas Goh, RN

## 2016-01-08 NOTE — ED Notes (Signed)
Pt placed in Trendelenburg. BP rechecked: 81/50 (61). MD notified.

## 2016-01-08 NOTE — ED Notes (Signed)
CCNP at bedside.  

## 2016-01-08 NOTE — Progress Notes (Signed)
eLink Physician-Brief Progress Note Patient Name: Glenda Blankenship DOB: 05/21/1940 MRN: [id number]   Date of Service  01/08/2016  HPI/Events of Note  Request for PO diet.  eICU Interventions  Will order: 1. NPO except sips with meds and ice chips.  Primary team to re-evaluate for diet in AM.     Intervention Category Minor Interventions: Routine modifications to care plan (e.g. PRN medications for pain, fever)  Kristain Hu Eugene 01/08/2016, 4:13 AM

## 2016-01-08 NOTE — Progress Notes (Signed)
eLink Physician-Brief Progress Note Patient Name: Glenda Blankenship DOB: 08/23/1940 MRN: [id number]   Date of Service  01/08/2016  HPI/Events of Note  Multiple issues: 1. Troponin #1 = 1.48 and 2. Lactic Acid = 5.67 >> 9.11 > 10.7. MAP has been >= 65 on Phenylephrine IV infusion. Will need CVL, CVP measurements and COOX. Hgb = 11.4.  eICU Interventions  Will order: 1. ASA 81 mg PO now and Q day. 2. Continue to trend Troponin. 3. 0.9 NaCl 1 liter IV over 1 hour now.      Intervention Category Major Interventions: Acid-Base disturbance - evaluation and management Intermediate Interventions: Diagnostic test evaluation  Donnisha Besecker Eugene 01/08/2016, 6:47 AM

## 2016-01-08 NOTE — ED Notes (Signed)
RTT at bedside

## 2016-01-08 NOTE — Progress Notes (Signed)
Pharmacy Antibiotic Note  Glenda Blankenship is a 75 y.o. female admitted on 01/07/2016 with pneumonia,Intra-abdominal, gastroenteritis.  Pharmacy has been consulted for Ceftriaxone dosing.  Plan: Ceftriaxone 2gm iv q24hr  Height: 5\' 2"  (157.5 cm) Weight: 160 lb (72.6 kg) IBW/kg (Calculated) : 50.1  Temp (24hrs), Avg:99 F (37.2 C), Min:98.1 F (36.7 C), Max:99.1 F (37.3 C)   Recent Labs Lab 01/07/16 2230 01/07/16 2242  WBC 8.3  --   CREATININE 2.32*  --   LATICACIDVEN  --  5.68*    Estimated Creatinine Clearance: 19.5 mL/min (by C-G formula based on SCr of 2.32 mg/dL (H)).    No Known Allergies  Antimicrobials this admission: Ceftriaxone 11/9 >>  Dose adjustments this admission: -  Microbiology results: pending  Thank you for allowing pharmacy to be a part of this patient's care.  Glenda Blankenship, Glenda Blankenship 01/08/2016 2:24 AM

## 2016-01-08 NOTE — ED Notes (Signed)
Notified EDP,Nanavati,MD., pt. I-stat CG4 Lactic acid 9.11 and RN,Abby made aware.

## 2016-01-08 NOTE — Progress Notes (Signed)
CRITICAL VALUE ALERT  Critical value received:  Troponin 1.48, Lactic Acid 10.7  Date of notification:  01/08/16  Time of notification:  0630  Critical value read back:Yes.    Nurse who received alert:  Kymiah Araiza  MD notified (1st page):  Dr. Arsenio LoaderSommer  Time of first page:  6:35  MD notified (2nd page):  Time of second page:  Responding MD:  Arsenio LoaderSommer  Time MD responded:  6:35

## 2016-01-08 NOTE — ED Provider Notes (Addendum)
[facility]-EMERGENCY DEPT Provider Note   CSN: [id number] Arrival date & time: 01/07/16  2203     History   Chief Complaint Chief Complaint  Patient presents with  . Hypotension  . Diarrhea  . Emesis    HPI Glenda Blankenship is a 75 y.o. female.  HPI 75 year old female with past medical history of diabetes and hypertension. Pt reports doing well yday, but woke up feeling unwell. She then had 10+ episodes of diarrhea. No blood. Pt has had no abd pain. Pt does endorse nausea, but without emesis. Pt continued to get weak and started feeling dizzy, so EMS was called. Pt arrived to the ER hypotensive. She has no headache, neck pain, chest pain, dib, abd pain, rash. Pt denies dysuria, hematuria. No recent antibiotics.  Past Medical History:  Diagnosis Date  . Diabetes (HCC)     There are no active problems to display for this patient.   History reviewed. No pertinent surgical history.  OB History    No data available       Home Medications    Prior to Admission medications   Medication Sig Start Date End Date Taking? Authorizing Provider  allopurinol (ZYLOPRIM) 300 MG tablet Take 1 tablet by mouth daily. 11/30/15  Yes Historical Provider, MD  aspirin 81 MG chewable tablet Chew 81 mg by mouth daily.   Yes Historical Provider, MD  lisinopril-hydrochlorothiazide (PRINZIDE,ZESTORETIC) 20-25 MG tablet Take 1 tablet by mouth daily. 11/30/15  Yes Historical Provider, MD  potassium citrate (UROCIT-K) 10 MEQ (1080 MG) SR tablet Take 1 tablet by mouth daily. 11/30/15  Yes Historical Provider, MD    Family History History reviewed. No pertinent family history.  Social History Social History  Substance Use Topics  . Smoking status: Former Games developermoker  . Smokeless tobacco: Never Used  . Alcohol use Not on file     Allergies   Patient has no known allergies.   Review of Systems Review of Systems   ROS 10 Systems reviewed and are negative for acute change except as noted in the  HPI.     Physical Exam Updated Vital Signs BP 102/58   Pulse 72   Temp 98.8 F (37.1 C)   Resp (!) 27   Ht 5\' 2"  (1.575 m)   Wt 160 lb (72.6 kg)   SpO2 100%   BMI 29.26 kg/m   Physical Exam  Constitutional: She is oriented to person, place, and time. She appears well-developed.  HENT:  Head: Normocephalic and atraumatic.  Eyes: EOM are normal.  Neck: Normal range of motion. Neck supple. No JVD present.  Cardiovascular: Normal rate.   Pulmonary/Chest: Effort normal. No respiratory distress. She has no wheezes. She has no rales.  Abdominal: Bowel sounds are normal. There is no tenderness. There is no guarding.  Musculoskeletal: Normal range of motion.  Neurological: She is alert and oriented to person, place, and time.  Skin: Skin is warm and dry. Capillary refill takes 2 to 3 seconds.  Nursing note and vitals reviewed.    ED Treatments / Results  Labs (all labs ordered are listed, but only abnormal results are displayed) Labs Reviewed  COMPREHENSIVE METABOLIC PANEL - Abnormal; Notable for the following:       Result Value   Potassium 5.6 (*)    Glucose, Bld 165 (*)    BUN 25 (*)    Creatinine, Ser 2.32 (*)    Calcium 8.5 (*)    AST 53 (*)    GFR calc non  Af Amer 19 (*)    GFR calc Af Amer 23 (*)    All other components within normal limits  CBC WITH DIFFERENTIAL/PLATELET - Abnormal; Notable for the following:    Hemoglobin 11.0 (*)    HCT 35.7 (*)    All other components within normal limits  BLOOD GAS, VENOUS - Abnormal; Notable for the following:    pH, Ven 7.224 (*)    Acid-base deficit 5.5 (*)    All other components within normal limits  CBG MONITORING, ED - Abnormal; Notable for the following:    Glucose-Capillary 110 (*)    All other components within normal limits  CBG MONITORING, ED - Abnormal; Notable for the following:    Glucose-Capillary 156 (*)    All other components within normal limits  I-STAT CG4 LACTIC ACID, ED - Abnormal; Notable for  the following:    Lactic Acid, Venous 5.68 (*)    All other components within normal limits  CULTURE, BLOOD (ROUTINE X 2)  CULTURE, BLOOD (ROUTINE X 2)  URINE CULTURE  GASTROINTESTINAL PANEL BY PCR, STOOL (REPLACES STOOL CULTURE)  PROTIME-INR  URINALYSIS, ROUTINE W REFLEX MICROSCOPIC (NOT AT [facility])  CBG MONITORING, ED  I-STAT CG4 LACTIC ACID, ED  TYPE AND SCREEN    EKG  EKG Interpretation  Date/Time:  Thursday January 07 2016 22:29:00 EST Ventricular Rate:  90 PR Interval:    QRS Duration: 92 QT Interval:  407 QTC Calculation: 498 R Axis:   81 Text Interpretation:  Sinus rhythm Borderline right axis deviation Borderline repolarization abnormality Borderline prolonged QT interval No acute changes Confirmed by Rhunette CroftNANAVATI, MD, Janey GentaANKIT [phone]([id number]) on 01/07/2016 11:08:57 PM       Radiology Dg Chest Port 1 View  Result Date: 01/07/2016 CLINICAL DATA:  Fever and vomiting.  Cough. EXAM: PORTABLE CHEST 1 VIEW COMPARISON:  None. FINDINGS: Normal heart size and pulmonary vascularity. Coarse central linear opacities with peribronchial thickening consistent with chronic bronchitis. No focal airspace disease or consolidation. No blunting of costophrenic angles. No pneumothorax. Old bilateral rib fractures. Calcification of the aorta. Degenerative changes in the spine. IMPRESSION: Chronic bronchitic changes. No evidence of active pulmonary disease. Electronically Signed   By: Burman NievesWilliam  Stevens M.D.   On: 01/07/2016 23:11    Procedures .Critical Care Performed by: Derwood KaplanNANAVATI, Laquenta Whitsell Authorized by: Derwood KaplanNANAVATI, Jet Traynham   Critical care provider statement:    Critical care time (minutes):  65   Critical care time was exclusive of:  Separately billable procedures and treating other patients   Critical care was necessary to treat or prevent imminent or life-threatening deterioration of the following conditions:  Circulatory failure and sepsis   Critical care was time spent personally by me on the following  activities:  Blood draw for specimens, development of treatment plan with patient or surrogate, discussions with consultants, discussions with primary provider, evaluation of patient's response to treatment, examination of patient, interpretation of cardiac output measurements, obtaining history from patient or surrogate, ordering and performing treatments and interventions, ordering and review of laboratory studies, pulse oximetry and re-evaluation of patient's condition    (including critical care time)  Medications Ordered in ED Medications  ondansetron (ZOFRAN) injection 4 mg (not administered)  phenylephrine (NEO-SYNEPHRINE) 10 mg in dextrose 5 % 250 mL (0.04 mg/mL) infusion (30 mcg/min Intravenous New Bag/Given 01/08/16 0200)  sodium chloride 0.9 % bolus 1,000 mL (1,000 mLs Intravenous New Bag/Given 01/08/16 0150)  sodium chloride 0.9 % bolus 3,000 mL (0 mLs Intravenous Stopped 01/07/16 2352)  acetaminophen (TYLENOL)  suppository 650 mg (650 mg Rectal Given 01/07/16 2253)  cefTRIAXone (ROCEPHIN) 1 g in dextrose 5 % 50 mL IVPB (0 g Intravenous Stopped 01/08/16 0027)  lactated ringers bolus 1,000 mL (0 mLs Intravenous Stopped 01/08/16 0208)  vancomycin (VANCOCIN) IVPB 1000 mg/200 mL premix (0 mg Intravenous Stopped 01/08/16 0208)     Initial Impression / Assessment and Plan / ED Course  I have reviewed the triage vital signs and the nursing notes.  Pertinent labs & imaging results that were available during my care of the patient were reviewed by me and considered in my medical decision making (see chart for details).  Clinical Course as of Jan 08 244  Caleen Essex Jan 08, 2016  0204 Pt is s/p 4 liters of crystalloids. Made very little urine.  Sepsis - Repeat Assessment  Performed at:    2:05 AM  Vitals     @VITALSNOTREFRESHABLE @  Heart:     Regular rate and rhythm  Lungs:    CTA  Capillary Refill:   <2 sec  Peripheral Pulse:   Radial pulse palpable  Skin:     Normal Color   [AN]    0244 Lactated rising. No abd tenderness. Less likely to be mesenteric ischemia. Abd tenderness is benign.  [AN]    Clinical Course User Index [AN] Derwood Kaplan, MD    Pt in shock. Undifferentiated, but likely septic shock given the fevers. Pt had diarrhea - so hypovolemic shock also possible. We will start sepsis resuscitation. Anticipate admission.    Final Clinical Impressions(s) / ED Diagnoses   Final diagnoses:  Shock (HCC)  Metabolic acidosis  Lactic acidosis   \ New Prescriptions New Prescriptions   No medications on file     Derwood Kaplan, MD 01/08/16 1610    Derwood Kaplan, MD 01/08/16 0246

## 2016-01-09 ENCOUNTER — Inpatient Hospital Stay (HOSPITAL_COMMUNITY): Payer: Medicare Other

## 2016-01-09 LAB — GLUCOSE, CAPILLARY
GLUCOSE-CAPILLARY: 108 mg/dL — AB (ref 65–99)
GLUCOSE-CAPILLARY: 93 mg/dL (ref 65–99)
GLUCOSE-CAPILLARY: 103 mg/dL — AB (ref 65–99)
GLUCOSE-CAPILLARY: 140 mg/dL — AB (ref 65–99)
GLUCOSE-CAPILLARY: 151 mg/dL — AB (ref 65–99)
Glucose-Capillary: 123 mg/dL — ABNORMAL HIGH (ref 65–99)

## 2016-01-09 LAB — PHOSPHORUS: PHOSPHORUS: 2.8 mg/dL (ref 2.5–4.6)

## 2016-01-09 LAB — BASIC METABOLIC PANEL
ANION GAP: 5 (ref 5–15)
BUN: 31 mg/dL — ABNORMAL HIGH (ref 6–20)
CO2: 27 mmol/L (ref 22–32)
Calcium: 7.8 mg/dL — ABNORMAL LOW (ref 8.9–10.3)
Chloride: 105 mmol/L (ref 101–111)
Creatinine, Ser: 1.82 mg/dL — ABNORMAL HIGH (ref 0.44–1.00)
GFR calc non Af Amer: 26 mL/min — ABNORMAL LOW (ref >60)
GFR, EST AFRICAN AMERICAN: 30 mL/min — AB (ref >60)
GLUCOSE: 111 mg/dL — AB (ref 65–99)
POTASSIUM: 3.9 mmol/L (ref 3.5–5.1)
Sodium: 137 mmol/L (ref 135–145)

## 2016-01-09 LAB — CBC WITH DIFFERENTIAL/PLATELET
BASOS ABS: 0 10*3/uL (ref 0.0–0.1)
Basophils Relative: 0 %
Eosinophils Absolute: 0 10*3/uL (ref 0.0–0.7)
Eosinophils Relative: 0 %
HEMATOCRIT: 32.1 % — AB (ref 36.0–46.0)
HEMOGLOBIN: 10.3 g/dL — AB (ref 12.0–15.0)
LYMPHS PCT: 23 %
Lymphs Abs: 1.9 10*3/uL (ref 0.7–4.0)
MCH: 28.1 pg (ref 26.0–34.0)
MCHC: 32.1 g/dL (ref 30.0–36.0)
MCV: 87.5 fL (ref 78.0–100.0)
MONO ABS: 0.4 10*3/uL (ref 0.1–1.0)
Monocytes Relative: 5 %
NEUTROS ABS: 5.9 10*3/uL (ref 1.7–7.7)
NEUTROS PCT: 72 %
Platelets: 123 10*3/uL — ABNORMAL LOW (ref 150–400)
RBC: 3.67 MIL/uL — AB (ref 3.87–5.11)
RDW: 14 % (ref 11.5–15.5)
WBC: 8.2 10*3/uL (ref 4.0–10.5)

## 2016-01-09 LAB — URINE CULTURE: CULTURE: NO GROWTH

## 2016-01-09 LAB — TROPONIN I: Troponin I: 2.11 ng/mL (ref <0.03)

## 2016-01-09 LAB — MAGNESIUM: Magnesium: 1.6 mg/dL — ABNORMAL LOW (ref 1.7–2.4)

## 2016-01-09 MED ORDER — SODIUM CHLORIDE 0.9 % IV SOLN
INTRAVENOUS | Status: DC
  Administered 2016-01-09: 19:00:00 via INTRAVENOUS

## 2016-01-09 MED ORDER — AMLODIPINE BESYLATE 10 MG PO TABS
10.0000 mg | ORAL_TABLET | Freq: Every day | ORAL | Status: DC
  Administered 2016-01-09 – 2016-01-10 (×2): 10 mg via ORAL
  Filled 2016-01-09 (×2): qty 1

## 2016-01-09 NOTE — Progress Notes (Signed)
PULMONARY / CRITICAL CARE MEDICINE   Name: Glenda Blankenship MRN: [id number] DOB: 02/06/1941    ADMISSION DATE:  01/07/2016 CONSULTATION DATE:  11/9  REFERRING MD:  nanavati  CHIEF COMPLAINT:  hypotension  HISTORY OF PRESENT ILLNESS:   75 year old female with past medical history of diabetes and hypertension. She was in her usual state of health until the morning of 01/07/2016 when she awoke with a feeling of malaise. She proceeded to have several episodes of diarrhea (estimated 15 episodes) throughout the course of the day. Initially it was yellowish in color but eventually progressed to very watery diarrhea. She complained of nausea, however, she did not experience any vomiting. For dinner the night before she ate cabbage, potatoes, and chicken. She did not check to check in with a meat thermometer to ensure internal temperature had reached a safe range. She denied abdominal pain. As the day went on she began to experience lightheadedness and dizziness when she would stand up. For these reasons she [facility]. In the emergency department she was found to be severely hypotensive with systolic pressure getting to be as low as 54. She was aggressively resuscitated with IV fluids. Initial lactic acid 5.68. Additional laboratory evaluation significant for WBC 8.3, hemoglobin 11, potassium 5.6, serum creatinine 2.3 with unknown baseline, and glucose 165. She was having chills at home, but no fever noted in the emergency department. She was started on broad-spectrum antibiotics. After 4 L of IV fluid resuscitation she remained hypotensive. PCCM asked to admit. Of note she denies recent antibiotic use.    SUBJECTIVE:  C/o loose stool x 1 Afebrile C/o sore throat & dry cough Good UO Bicarb stopped , wheezy late evening yesterday  VITAL SIGNS: BP (!) 146/76   Pulse 99   Temp 98.8 F (37.1 C) (Oral)   Resp (!) 29   Ht 5\' 2"  (1.575 m)   Wt 182 lb 5.1  oz (82.7 kg)   SpO2 94%   BMI 33.35 kg/m   HEMODYNAMICS: CVP:  [12 mmHg-16 mmHg] 16 mmHg  VENTILATOR SETTINGS:    INTAKE / OUTPUT: I/O last 3 completed shifts: In: 8308.5 [I.V.:1854; IV Piggyback:6454.5] Out: 1365 [Urine:1362; Stool:3]  PHYSICAL EXAMINATION: General:  Pleasant elderly female in NAD Neuro:  Alert, oriented, non-focal HEENT:  [place]/AT, PERRL, no JVD. MM dry Cardiovascular:  RRR, no MRG Lungs:  Clear posterior lung fields Abdomen:  Soft, non-tender, non-distended Musculoskeletal:  No acute deformity or ROM limitation Skin:  Grossly intact  LABS:  BMET  Recent Labs Lab 01/08/16 0545 01/08/16 1040 01/09/16 0436  NA 141 141 137  K 5.8* 4.6 3.9  CL 109 108 105  CO2 16* 21* 27  BUN 26* 28* 31*  CREATININE 2.32* 2.38* 1.82*  GLUCOSE 116* 148* 111*    Electrolytes  Recent Labs Lab 01/08/16 0545 01/08/16 1040 01/09/16 0436  CALCIUM 8.2* 7.6* 7.8*  MG  --  1.5* 1.6*  PHOS  --  2.8 2.8    CBC  Recent Labs Lab 01/07/16 2230 01/08/16 0545 01/09/16 0436  WBC 8.3 9.8 8.2  HGB 11.0* 11.4* 10.3*  HCT 35.7* 36.9 32.1*  PLT 192 135* 123*    Coag's  Recent Labs Lab 01/07/16 2230  INR 1.08    Sepsis Markers  Recent Labs Lab 01/08/16 0545 01/08/16 1040 01/08/16 1430  LATICACIDVEN 10.7* 4.5* 2.9*  PROCALCITON 1.84  --   --     ABG No results for input(s): PHART, PCO2ART,  PO2ART in the last 168 hours.  Liver Enzymes  Recent Labs Lab 01/07/16 2230  AST 53*  ALT 26  ALKPHOS 66  BILITOT 0.5  ALBUMIN 3.5    Cardiac Enzymes  Recent Labs Lab 01/08/16 0545 01/08/16 1040 01/08/16 1430  TROPONINI 2.11* 2.14* 2.70*    Glucose  Recent Labs Lab 01/08/16 1146 01/08/16 1623 01/08/16 2014 01/08/16 2334 01/09/16 0449 01/09/16 0738  GLUCAP 100* 92 76 158* 108* 93    Imaging Ct Abdomen Wo Contrast  Result Date: 01/08/2016 CLINICAL DATA:  Left flank tenderness, lactic acidosis EXAM: CT ABDOMEN WITHOUT CONTRAST  TECHNIQUE: Multidetector CT imaging of the abdomen was performed following the standard protocol without IV contrast. COMPARISON:  CT abdomen/ pelvis dated 08/11/2009 FINDINGS: Motion degraded images. Lower chest: Small bilateral pleural effusions, right greater than left. The right pleural effusion appears loculated. Hepatobiliary: Unenhanced liver is unremarkable. Layering gallstones (series 2/ image 34), without associated inflammatory changes. Pancreas: Pancreas is grossly unremarkable. Trace fluid along the pancreatic body/ tail is favored to reflect abdominal ascites. Spleen: Within normal limits. Adrenals/Urinary Tract: Adrenal glands are within normal limits. Kidneys are within normal limits. No renal calculi or hydronephrosis. Stomach/Bowel: Stomach is within normal limits. Visualized bowel is grossly unremarkable. Vascular/Lymphatic: No evidence of abdominal aortic aneurysm. Atherosclerotic calcifications of the abdominal aorta and branch vessels. Other: Small volume abdominal ascites. Musculoskeletal: Degenerative changes of the visualized thoracolumbar spine. IMPRESSION: Motion degraded images. No renal calculi or hydronephrosis. Cholelithiasis, without associated inflammatory changes. Small volume abdominal ascites. Small bilateral pleural effusions, right greater than left, partially loculated on the right. Consider CT chest for further evaluation as clinically warranted. Please note that the pelvis was not imaged. Electronically Signed   By: Charline BillsSriyesh  Krishnan M.D.   On: 01/08/2016 17:14   Dg Chest Port 1 View  Result Date: 01/09/2016 CLINICAL DATA:  Shortness of breath. EXAM: PORTABLE CHEST 1 VIEW COMPARISON:  January 08, 2016 FINDINGS: The right central line is stable. No pneumothorax identified. Effusion and opacity in the right base is stable. Mild atelectasis in the left lung base. No other changes. IMPRESSION: Stable right central line, mild left basilar atelectasis, and right basilar  effusion with atelectasis. Electronically Signed   By: Gerome Samavid  Williams III M.D   On: 01/09/2016 07:24   Dg Chest Port 1 View  Result Date: 01/08/2016 CLINICAL DATA:  Acute respiratory failure EXAM: PORTABLE CHEST 1 VIEW COMPARISON:  01/08/2016 FINDINGS: Cardiomegaly. Right IJ catheter is unchanged in position. Central mild vascular congestion without convincing pulmonary edema PE bilateral small pleural effusion bilateral basilar atelectasis or infiltrate IMPRESSION: Cardiomegaly. No pulmonary edema. Bilateral small pleural effusion with bilateral basilar atelectasis or infiltrate. Electronically Signed   By: Natasha MeadLiviu  Pop M.D.   On: 01/08/2016 15:43   Dg Chest Port 1 View  Result Date: 01/08/2016 CLINICAL DATA:  Evaluate dialysis catheter placement EXAM: PORTABLE CHEST 1 VIEW COMPARISON:  01/07/2016 FINDINGS: Right IJ catheter is identified with tip in the projection of the SVC. There is no pneumothorax identified peer The heart size is moderately enlarged. Small pleural effusions and mild edema identified. No airspace opacities. IMPRESSION: 1. Status post right IJ catheter placement. No pneumothorax identified. 2. Mild CHF. Electronically Signed   By: Signa Kellaylor  Stroud M.D.   On: 01/08/2016 11:21    STUDIES:  CT abd >> BL small  effusions, loculation on right, gallstones  CULTURES: BCx2 11/10 > Urine 11/10 > GI panel PCR 11/10 >  ANTIBIOTICS: Ceftriaxone 11/10 > Vancomycin 11/10  SIGNIFICANT EVENTS: 11/10 admit  LINES/TUBES:   DISCUSSION: 75 year old female with one-day history of severe diarrhea presenting to the emergency department with lightheadedness/dizziness. Will admit for shock favor hypovolemic in the setting of viral gastroenteritis, however, given that she may have eaten some undercooked chicken will also cover for bacterial.   Off pressors & AKI resolving, but some concern for fluid overload  ASSESSMENT / PLAN:  PULMONARY A: FLuid overload BL loculated  effusions  P:   Supplemental O2 PRN to keep sats > 90%  CARDIOVASCULAR A:  Shock- Likely hypovolemic in the setting of diarrhea, also considering septic. Lactic acidosis - resolving History of HTN NSTEMI  P:  Telemetry Hold lisinopril  MAP goal > 85mm/Hg, Off  neo  Check echo Trend to capture peak trop  RENAL A:   AKI Hyperkalemia -resolved  P:   NS @ 50/h - low threshold to dc if increased dyspnea Dc bicarb gtt Repeat BMP  GASTROINTESTINAL A:   Gastroenteritis  P:  Advance to 2g Na diet Protoinx PO  HEMATOLOGIC A:   Mild anemia  P:  Heparin for VTE ppx  INFECTIOUS A:   Gastroenteritis, likely viral c diff neg  P:   CTX as above Follow blood cultures  ENDOCRINE A:   DM   P:   CBG monitoring and SSI    FAMILY  - Updates: son Genevie Cheshire  updated   - Inter-disciplinary family meet or Palliative Care meeting due by:  NA   The patient is critically ill with multiple organ systems failure and requires high complexity decision making for assessment and support, frequent evaluation and titration of therapies, application of advanced monitoring technologies and extensive interpretation of multiple databases. Critical Care Time devoted to patient care services described in this note independent of APP time is 35 minutes.    Cyril Mourning MD. Tonny Bollman. [place] [facility] Pager [phone] If no response call [phone]     01/09/2016 9:58 AM

## 2016-01-09 NOTE — Progress Notes (Signed)
eLink Physician-Brief Progress Note Patient Name: Glenda ChenMarie J Glassberg DOB: 02/13/1941 MRN: [id number]   Date of Service  01/09/2016  HPI/Events of Note  Blood pressure elevated  eICU Interventions  Start norvasc 10 mg daily     Intervention Category Major Interventions: Hypertension - evaluation and management  Henry RusselSMITH, Gevorg Brum, P 01/09/2016, 4:28 PM

## 2016-01-10 DIAGNOSIS — I214 Non-ST elevation (NSTEMI) myocardial infarction: Secondary | ICD-10-CM

## 2016-01-10 LAB — CBC WITH DIFFERENTIAL/PLATELET
Basophils Absolute: 0 10*3/uL (ref 0.0–0.1)
Basophils Relative: 0 %
EOS ABS: 0 10*3/uL (ref 0.0–0.7)
EOS PCT: 0 %
HCT: 29.5 % — ABNORMAL LOW (ref 36.0–46.0)
Hemoglobin: 9.4 g/dL — ABNORMAL LOW (ref 12.0–15.0)
LYMPHS ABS: 1.8 10*3/uL (ref 0.7–4.0)
LYMPHS PCT: 27 %
MCH: 27.8 pg (ref 26.0–34.0)
MCHC: 31.9 g/dL (ref 30.0–36.0)
MCV: 87.3 fL (ref 78.0–100.0)
MONO ABS: 0.4 10*3/uL (ref 0.1–1.0)
Monocytes Relative: 6 %
Neutro Abs: 4.5 10*3/uL (ref 1.7–7.7)
Neutrophils Relative %: 67 %
PLATELETS: 120 10*3/uL — AB (ref 150–400)
RBC: 3.38 MIL/uL — AB (ref 3.87–5.11)
RDW: 14.2 % (ref 11.5–15.5)
WBC: 6.7 10*3/uL (ref 4.0–10.5)

## 2016-01-10 LAB — GLUCOSE, CAPILLARY
GLUCOSE-CAPILLARY: 95 mg/dL (ref 65–99)
GLUCOSE-CAPILLARY: 157 mg/dL — AB (ref 65–99)
Glucose-Capillary: 88 mg/dL (ref 65–99)
Glucose-Capillary: 152 mg/dL — ABNORMAL HIGH (ref 65–99)
Glucose-Capillary: 146 mg/dL — ABNORMAL HIGH (ref 65–99)

## 2016-01-10 LAB — BASIC METABOLIC PANEL
Anion gap: 8 (ref 5–15)
BUN: 24 mg/dL — AB (ref 6–20)
CALCIUM: 8.1 mg/dL — AB (ref 8.9–10.3)
CO2: 27 mmol/L (ref 22–32)
CREATININE: 1.28 mg/dL — AB (ref 0.44–1.00)
Chloride: 105 mmol/L (ref 101–111)
GFR calc Af Amer: 46 mL/min — ABNORMAL LOW (ref >60)
GFR, EST NON AFRICAN AMERICAN: 40 mL/min — AB (ref >60)
GLUCOSE: 95 mg/dL (ref 65–99)
POTASSIUM: 3.5 mmol/L (ref 3.5–5.1)
SODIUM: 140 mmol/L (ref 135–145)

## 2016-01-10 LAB — PHOSPHORUS: Phosphorus: 2.1 mg/dL — ABNORMAL LOW (ref 2.5–4.6)

## 2016-01-10 LAB — MAGNESIUM: MAGNESIUM: 1.5 mg/dL — AB (ref 1.7–2.4)

## 2016-01-10 MED ORDER — METOPROLOL TARTRATE 25 MG PO TABS
25.0000 mg | ORAL_TABLET | Freq: Two times a day (BID) | ORAL | Status: DC
  Administered 2016-01-10 – 2016-01-14 (×9): 25 mg via ORAL
  Filled 2016-01-10 (×9): qty 1

## 2016-01-10 MED ORDER — MAGNESIUM SULFATE 2 GM/50ML IV SOLN
2.0000 g | Freq: Once | INTRAVENOUS | Status: AC
  Administered 2016-01-10: 2 g via INTRAVENOUS
  Filled 2016-01-10: qty 50

## 2016-01-10 MED ORDER — FUROSEMIDE 20 MG PO TABS
20.0000 mg | ORAL_TABLET | Freq: Once | ORAL | Status: AC
  Administered 2016-01-10: 20 mg via ORAL
  Filled 2016-01-10: qty 1

## 2016-01-10 MED ORDER — POTASSIUM CHLORIDE 20 MEQ/15ML (10%) PO SOLN
40.0000 meq | Freq: Once | ORAL | Status: AC
  Administered 2016-01-10: 40 meq via ORAL
  Filled 2016-01-10: qty 30

## 2016-01-10 NOTE — Progress Notes (Signed)
PULMONARY / CRITICAL CARE MEDICINE   Name: Glenda ChenMarie J Biskup MRN: [id number] DOB: 12/29/1940    ADMISSION DATE:  01/07/2016 CONSULTATION DATE:  11/9  REFERRING MD:  nanavati  CHIEF COMPLAINT:  hypotension  HISTORY OF PRESENT ILLNESS:   75 year old female with one-day history of severe diarrhea presenting to the emergency department with lightheadedness/dizziness. Will admit for shock favor hypovolemic in the setting of viral gastroenteritis Course complicated by AKI & NSTEMI  SUBJECTIVE:  C/o loose stool x 2 Febrile 101 Good UO Mild drop in satn  VITAL SIGNS: BP (!) 142/65 (BP Location: Left Arm)   Pulse (!) 104   Temp 99.3 F (37.4 C) (Core (Comment))   Resp (!) 22   Ht 5\' 2"  (1.575 m)   Wt 182 lb 1.6 oz (82.6 kg)   SpO2 94%   BMI 33.31 kg/m   HEMODYNAMICS: CVP:  [15 mmHg-20 mmHg] 15 mmHg  VENTILATOR SETTINGS:    INTAKE / OUTPUT: I/O last 3 completed shifts: In: 1348.3 [P.O.:540; I.V.:708.3; IV Piggyback:100] Out: 2275 [Urine:2275]  PHYSICAL EXAMINATION: General:  Pleasant elderly female in NAD Neuro:  Alert, oriented, non-focal HEENT:  [place]/AT, PERRL, no JVD. MM dry Cardiovascular:  RRR, no MRG Lungs:  Clear posterior lung fields Abdomen:  Soft, non-tender, non-distended Musculoskeletal:  No acute deformity or ROM limitation Skin:  Grossly intact  LABS:  BMET  Recent Labs Lab 01/08/16 1040 01/09/16 0436 01/10/16 0500  NA 141 137 140  K 4.6 3.9 3.5  CL 108 105 105  CO2 21* 27 27  BUN 28* 31* 24*  CREATININE 2.38* 1.82* 1.28*  GLUCOSE 148* 111* 95    Electrolytes  Recent Labs Lab 01/08/16 1040 01/09/16 0436 01/10/16 0500  CALCIUM 7.6* 7.8* 8.1*  MG 1.5* 1.6* 1.5*  PHOS 2.8 2.8 2.1*    CBC  Recent Labs Lab 01/08/16 0545 01/09/16 0436 01/10/16 0500  WBC 9.8 8.2 6.7  HGB 11.4* 10.3* 9.4*  HCT 36.9 32.1* 29.5*  PLT 135* 123* 120*    Coag's  Recent Labs Lab 01/07/16 2230  INR 1.08    Sepsis Markers  Recent Labs Lab  01/08/16 0545 01/08/16 1040 01/08/16 1430  LATICACIDVEN 10.7* 4.5* 2.9*  PROCALCITON 1.84  --   --     ABG No results for input(s): PHART, PCO2ART, PO2ART in the last 168 hours.  Liver Enzymes  Recent Labs Lab 01/07/16 2230  AST 53*  ALT 26  ALKPHOS 66  BILITOT 0.5  ALBUMIN 3.5    Cardiac Enzymes  Recent Labs Lab 01/08/16 1040 01/08/16 1430 01/09/16 1050  TROPONINI 2.14* 2.70* 2.11*    Glucose  Recent Labs Lab 01/09/16 1129 01/09/16 1634 01/09/16 1950 01/09/16 2322 01/10/16 0336 01/10/16 0752  GLUCAP 103* 140* 123* 151* 88 95    Imaging No results found.  STUDIES:  CT abd >> BL small  effusions, loculation on right, gallstones  CULTURES: BCx2 11/10 >ng Urine 11/10 >ng GI panel PCR 11/10 >neg  ANTIBIOTICS: Ceftriaxone 11/10 > Vancomycin 11/10 >> 11/10  SIGNIFICANT EVENTS: 11/10 admit  LINES/TUBES: RIJ HD cath 11/10 >> 11/12  DISCUSSION: Off pressors & AKI resolving, but some concern for fluid overload, trop trending down  ASSESSMENT / PLAN:  PULMONARY A: FLuid overload BL loculated effusions  P:   Supplemental O2 PRN to keep sats > 90%  CARDIOVASCULAR A:  Shock- Likely hypovolemic in the setting of diarrhea, also considering septic. Lactic acidosis - resolving History of HTN NSTEMI -peak trop 2.7  P:  Telemetry Hold  lisinopril , start metoprolol 25 bid Await echo - may need cards consult if WMA +   RENAL A:   AKI Hyperkalemia -resolved Hypokalemia, hypomag  P:   Dc NS Dc bicarb gtt replete lytes  GASTROINTESTINAL A:   Gastroenteritis  P:  Advance to 2g Na diet Protoinx PO  HEMATOLOGIC A:   Mild anemia  P:  Heparin for VTE ppx  INFECTIOUS A:   Gastroenteritis, likely viral c diff neg  P:   CTX as above -stop x 5ds Follow blood cultures  ENDOCRINE A:   DM   P:   CBG monitoring and SSI    FAMILY  - Updates: son Genevie Cheshire  updated   - Inter-disciplinary family meet or Palliative Care  meeting due by:  NA   Can transfer to tele   Cyril Mourning MD. FCCP. [place] [facility] Pager [phone] If no response call [phone]     01/10/2016 11:41 AM

## 2016-01-11 ENCOUNTER — Inpatient Hospital Stay (HOSPITAL_COMMUNITY): Payer: Medicare Other

## 2016-01-11 DIAGNOSIS — R0600 Dyspnea, unspecified: Secondary | ICD-10-CM

## 2016-01-11 DIAGNOSIS — J90 Pleural effusion, not elsewhere classified: Secondary | ICD-10-CM

## 2016-01-11 DIAGNOSIS — K529 Noninfective gastroenteritis and colitis, unspecified: Secondary | ICD-10-CM

## 2016-01-11 LAB — BASIC METABOLIC PANEL
ANION GAP: 7 (ref 5–15)
BUN: 21 mg/dL — AB (ref 6–20)
CHLORIDE: 108 mmol/L (ref 101–111)
CO2: 25 mmol/L (ref 22–32)
Calcium: 8.2 mg/dL — ABNORMAL LOW (ref 8.9–10.3)
Creatinine, Ser: 1.25 mg/dL — ABNORMAL HIGH (ref 0.44–1.00)
GFR calc Af Amer: 48 mL/min — ABNORMAL LOW (ref >60)
GFR calc non Af Amer: 41 mL/min — ABNORMAL LOW (ref >60)
GLUCOSE: 90 mg/dL (ref 65–99)
POTASSIUM: 3.6 mmol/L (ref 3.5–5.1)
Sodium: 140 mmol/L (ref 135–145)

## 2016-01-11 LAB — BODY FLUID CELL COUNT WITH DIFFERENTIAL
EOS FL: 0 %
Lymphs, Fluid: 50 %
MONOCYTE-MACROPHAGE-SEROUS FLUID: 39 % — AB (ref 50–90)
NEUTROPHIL FLUID: 11 % (ref 0–25)
WBC FLUID: 758 uL (ref 0–1000)

## 2016-01-11 LAB — CBC WITH DIFFERENTIAL/PLATELET
BASOS ABS: 0 10*3/uL (ref 0.0–0.1)
Basophils Relative: 0 %
EOS PCT: 0 %
Eosinophils Absolute: 0 10*3/uL (ref 0.0–0.7)
HEMATOCRIT: 30 % — AB (ref 36.0–46.0)
HEMOGLOBIN: 9.6 g/dL — AB (ref 12.0–15.0)
LYMPHS ABS: 2.1 10*3/uL (ref 0.7–4.0)
LYMPHS PCT: 30 %
MCH: 28.7 pg (ref 26.0–34.0)
MCHC: 32 g/dL (ref 30.0–36.0)
MCV: 89.8 fL (ref 78.0–100.0)
Monocytes Absolute: 0.6 10*3/uL (ref 0.1–1.0)
Monocytes Relative: 9 %
NEUTROS ABS: 4.2 10*3/uL (ref 1.7–7.7)
Neutrophils Relative %: 61 %
Platelets: 142 10*3/uL — ABNORMAL LOW (ref 150–400)
RBC: 3.34 MIL/uL — AB (ref 3.87–5.11)
RDW: 14.5 % (ref 11.5–15.5)
WBC: 6.9 10*3/uL (ref 4.0–10.5)

## 2016-01-11 LAB — GLUCOSE, SEROUS FLUID: Glucose, Fluid: 155 mg/dL

## 2016-01-11 LAB — GLUCOSE, CAPILLARY
GLUCOSE-CAPILLARY: 114 mg/dL — AB (ref 65–99)
GLUCOSE-CAPILLARY: 81 mg/dL (ref 65–99)
GLUCOSE-CAPILLARY: 93 mg/dL (ref 65–99)
GLUCOSE-CAPILLARY: 96 mg/dL (ref 65–99)
Glucose-Capillary: 139 mg/dL — ABNORMAL HIGH (ref 65–99)

## 2016-01-11 LAB — PROTEIN, BODY FLUID: Total protein, fluid: <3 g/dL

## 2016-01-11 LAB — LACTATE DEHYDROGENASE, PLEURAL OR PERITONEAL FLUID: LD FL: 138 U/L — AB (ref 3–23)

## 2016-01-11 LAB — GRAM STAIN

## 2016-01-11 LAB — ECHOCARDIOGRAM COMPLETE
HEIGHTINCHES: 62 in
Weight: 2726.65 oz

## 2016-01-11 LAB — PHOSPHORUS: Phosphorus: 2.1 mg/dL — ABNORMAL LOW (ref 2.5–4.6)

## 2016-01-11 LAB — MAGNESIUM: Magnesium: 1.9 mg/dL (ref 1.7–2.4)

## 2016-01-11 MED ORDER — PERFLUTREN LIPID MICROSPHERE
1.0000 mL | INTRAVENOUS | Status: AC | PRN
  Administered 2016-01-11: 3 mL via INTRAVENOUS
  Filled 2016-01-11: qty 10

## 2016-01-11 MED ORDER — PERFLUTREN LIPID MICROSPHERE
INTRAVENOUS | Status: AC
Start: 2016-01-11 — End: 2016-01-11
  Filled 2016-01-11: qty 10

## 2016-01-11 NOTE — Procedures (Signed)
Ultrasound-guided diagnostic and therapeutic left thoracentesis performed yielding 180 cc of slightly hazy, light yellow fluid. No immediate complications. Follow-up chest x-ray pending. The fluid was sent to the lab for preordered studies.

## 2016-01-11 NOTE — Progress Notes (Signed)
PULMONARY / CRITICAL CARE MEDICINE   Name: Glenda Blankenship MRN: [id number] DOB: 08/29/1940    Admission date:  01/07/2016  Referring provider:  Dr. Rhunette CroftNanavati  Chief complaint:  Vomiting  Summary:   75 yo female with 1 day of nausea and vomiting likely from gastroenteritis after eating undercooked chicken.  She was hypotensive in ER from septic and hypovolemic shock.  Found to have elevated lactic acid, procalcitonin, troponin, and renal failure..  Subjective:  Denies chest/abd pain.  Breathing okay.  Tolerating diet.  Reports she only checks blood sugars at home, but is not on medication for DM.  Vital signs: BP (!) 162/70 (BP Location: Left Arm)   Pulse 92   Temp 99.9 F (37.7 C) (Oral)   Resp 20   Ht 5\' 2"  (1.575 m)   Wt 170 lb 6.7 oz (77.3 kg)   SpO2 92%   BMI 31.17 kg/m   Intake/output: I/O last 3 completed shifts: In: 770 [P.O.:420; I.V.:200; IV Piggyback:150] Out: 1271 [Urine:1270; Stool:1]  Physical exam: General:  Pleasant Neuro: normal strength HEENT: no stridor Cardiovascular: regular, no murmur Lungs: no wheeze, decreased BS at bases Abdomen:  Soft, non-tender Musculoskeletal: no edema Skin: no rashes   CBC Recent Labs     01/09/16  0436  01/10/16  0500  01/11/16  0413  WBC  8.2  6.7  6.9  HGB  10.3*  9.4*  9.6*  HCT  32.1*  29.5*  30.0*  PLT  123*  120*  142*    BMET Recent Labs     01/09/16  0436  01/10/16  0500  01/11/16  0413  NA  137  140  140  K  3.9  3.5  3.6  CL  105  105  108  CO2  27  27  25   BUN  31*  24*  21*  CREATININE  1.82*  1.28*  1.25*  GLUCOSE  111*  95  90    Electrolytes Recent Labs     01/09/16  0436  01/10/16  0500  01/11/16  0413  CALCIUM  7.8*  8.1*  8.2*  MG  1.6*  1.5*  1.9  PHOS  2.8  2.1*  2.1*    Cardiac Enzymes Recent Labs     01/08/16  1040  01/08/16  1430  01/09/16  1050  TROPONINI  2.14*  2.70*  2.11*    Glucose Recent Labs     01/10/16  1221  01/10/16  1726  01/10/16  2013   01/11/16  0006  01/11/16  0410  01/11/16  0737  GLUCAP  157*  152*  146*  96  93  81    Imaging No results found.   Studies:  CT abd/pelvis 11/10 >> small b/l effusions, atherosclerosis  Cultures: GI panel 11/10 >> negative C diff PCR 11/10 >> negative Urine 11/10 >> negative Blood 11/10 >>   Antibiotics: Ceftriaxone 11/10 >> Vancomycin 11/10 >> 11/10  Significant events: 11/10 Admit 11/12 To telemetry  Lines/tubes: RIJ HD cath 11/10 >> 11/12   Assessment/plan:  Gastroenteritis. - day 4 of rocephin  NSTEMI in setting of septic/hypovolemic shock. Hx of HTN. - ASA - f/u Echo - consult cardiology >> will do as inpt if she has abnormalities on Echo, otherwise can be arranges as outpt - continue lopressor - hold outpt lisinopril/HCTZ for now until renal fx stabilized more  Right pleural effusion. - f/u 2 view CXR >> if persistent, then might need sampling  Anemia of critical illness. - f/u CBC  Hyperglycemia >> improved; reports hx of DM, but not on meds as outpt. - d/c SSI - monitor blood sugar on BMET - check HbA1C  Hx of gout. - hold outpt allopurinol for now  Resolved problems >> Septic/hypovolemic shock, lactic acidosis, AKI, hyperkalemia, anion gap metabolic acidosis, acute hypoxic respiratory failure  DVT prophylaxis - SQ heparin SUP - not indicated Goals of care - DNR  Coralyn HellingVineet Avynn Klassen, MD [facility] 01/11/2016, 9:49 AM Pager:  [phone] After 3pm call: [phone]

## 2016-01-11 NOTE — Progress Notes (Signed)
Echocardiogram 2D Echocardiogram has been performed with definity.  Marisue Humblelexis N Emran Molzahn 01/11/2016, 2:34 PM

## 2016-01-11 NOTE — Progress Notes (Signed)
Pharmacy Antibiotic Note  Glenda Blankenship is a 75 y.o. female admitted on 01/07/2016 with pneumonia,Intra-abdominal, gastroenteritis.  Pharmacy has been consulted for Ceftriaxone dosing.  Today, 01/11/2016: Day #4 ceftriaxone  CBC WNL  Afebrile  AKI - improving  Plan:  Ceftriaxone 2gm iv q24hr - no renal dose adjustment required so pharmacy to sign-off  Previous notes state stop antibiotics after 5 -days of therapy - f/u d/c after tonight's dose to complete course  Height: 5\' 2"  (157.5 cm) Weight: 170 lb 6.7 oz (77.3 kg) IBW/kg (Calculated) : 50.1  Temp (24hrs), Avg:98.9 F (37.2 C), Min:98.4 F (36.9 C), Max:99.9 F (37.7 C)   Recent Labs Lab 01/07/16 2230 01/07/16 2242 01/08/16 0227 01/08/16 0545 01/08/16 1040 01/08/16 1430 01/09/16 0436 01/10/16 0500 01/11/16 0413  WBC 8.3  --   --  9.8  --   --  8.2 6.7 6.9  CREATININE 2.32*  --   --  2.32* 2.38*  --  1.82* 1.28* 1.25*  LATICACIDVEN  --  5.68* 9.11* 10.7* 4.5* 2.9*  --   --   --     Estimated Creatinine Clearance: 37.4 mL/min (by C-G formula based on SCr of 1.25 mg/dL (H)).    No Known Allergies  Antimicrobials this admission: Ceftriaxone 11/9 >>  Dose adjustments this admission: -  Microbiology results: Microbiology results:  11/09 BCx: ngtd 11/10 UCx: ngtd 11/10 MRSA PCR: neg 11/10 GI panel: neg 11/10 CDiff: neg/neg  Thank you for allowing pharmacy to be a part of this patient's care.  Juliette Alcideustin Mouhamed Glassco, PharmD, BCPS.   Pager: [phone] 01/11/2016 10:42 AM

## 2016-01-12 ENCOUNTER — Inpatient Hospital Stay (HOSPITAL_COMMUNITY): Payer: Medicare Other

## 2016-01-12 DIAGNOSIS — I272 Pulmonary hypertension, unspecified: Secondary | ICD-10-CM

## 2016-01-12 LAB — CBC
HEMATOCRIT: 29.3 % — AB (ref 36.0–46.0)
Hemoglobin: 9.3 g/dL — ABNORMAL LOW (ref 12.0–15.0)
MCH: 28.1 pg (ref 26.0–34.0)
MCHC: 31.7 g/dL (ref 30.0–36.0)
MCV: 88.5 fL (ref 78.0–100.0)
PLATELETS: 176 10*3/uL (ref 150–400)
RBC: 3.31 MIL/uL — AB (ref 3.87–5.11)
RDW: 14.4 % (ref 11.5–15.5)
WBC: 6.3 10*3/uL (ref 4.0–10.5)

## 2016-01-12 LAB — CULTURE, BLOOD (ROUTINE X 2): Culture: NO GROWTH

## 2016-01-12 LAB — BASIC METABOLIC PANEL
Anion gap: 9 (ref 5–15)
BUN: 23 mg/dL — AB (ref 6–20)
CHLORIDE: 108 mmol/L (ref 101–111)
CO2: 26 mmol/L (ref 22–32)
CREATININE: 1.01 mg/dL — AB (ref 0.44–1.00)
Calcium: 8.5 mg/dL — ABNORMAL LOW (ref 8.9–10.3)
GFR calc non Af Amer: 53 mL/min — ABNORMAL LOW (ref >60)
Glucose, Bld: 86 mg/dL (ref 65–99)
POTASSIUM: 3.5 mmol/L (ref 3.5–5.1)
Sodium: 143 mmol/L (ref 135–145)

## 2016-01-12 MED ORDER — HEPARIN BOLUS VIA INFUSION
3000.0000 [IU] | Freq: Once | INTRAVENOUS | Status: AC
  Administered 2016-01-12: 3000 [IU] via INTRAVENOUS
  Filled 2016-01-12: qty 3000

## 2016-01-12 MED ORDER — HEPARIN (PORCINE) IN NACL 100-0.45 UNIT/ML-% IJ SOLN
1100.0000 [IU]/h | INTRAMUSCULAR | Status: DC
  Administered 2016-01-12: 1100 [IU]/h via INTRAVENOUS
  Filled 2016-01-12: qty 250

## 2016-01-12 MED ORDER — IOPAMIDOL (ISOVUE-370) INJECTION 76%
100.0000 mL | Freq: Once | INTRAVENOUS | Status: AC | PRN
  Administered 2016-01-12: 100 mL via INTRAVENOUS

## 2016-01-12 NOTE — Care Management Important Message (Signed)
Important Message  Patient Details  Name: Glenda Blankenship MRN: [id number] Date of Birth: 02/12/1941   Medicare Important Message Given:  Yes    Haskell FlirtJamison, Ramiah Helfrich 01/12/2016, 9:26 AMImportant Message  Patient Details  Name: Glenda Blankenship MRN: [id number] Date of Birth: 01/19/1941   Medicare Important Message Given:  Yes    Haskell FlirtJamison, Sibel Khurana 01/12/2016, 9:26 AM

## 2016-01-12 NOTE — Progress Notes (Signed)
PULMONARY / CRITICAL CARE MEDICINE   Name: Glenda Blankenship MRN: [id number] DOB: 09/20/1940    Admission date:  01/07/2016  Referring provider:  Dr. Rhunette CroftNanavati  Chief complaint:  Vomiting  Summary:   75 yo female with 1 day of nausea and vomiting likely from gastroenteritis after eating undercooked chicken.  She was hypotensive in ER from septic and hypovolemic shock.  Found to have elevated lactic acid, procalcitonin, troponin, and renal failure..  Subjective:  Denies chest pain, dyspnea.  Vital signs: BP (!) 149/62 (BP Location: Left Arm)   Pulse 95   Temp 99.1 F (37.3 C) (Oral)   Resp 20   Ht 5\' 2"  (1.575 m)   Wt 169 lb 14.4 oz (77.1 kg)   SpO2 90%   BMI 31.08 kg/m   Intake/output: I/O last 3 completed shifts: In: 710 [P.O.:660; IV Piggyback:50] Out: 250 [Urine:250]  Physical exam: General:  Pleasant Neuro: normal strength HEENT: no stridor Cardiovascular: regular, no murmur Lungs: no wheeze, decreased BS at bases Abdomen:  Soft, non-tender Musculoskeletal: no edema Skin: no rashes   CBC Recent Labs     01/10/16  0500  01/11/16  0413  01/12/16  0326  WBC  6.7  6.9  6.3  HGB  9.4*  9.6*  9.3*  HCT  29.5*  30.0*  29.3*  PLT  120*  142*  176    BMET Recent Labs     01/10/16  0500  01/11/16  0413  01/12/16  0326  NA  140  140  143  K  3.5  3.6  3.5  CL  105  108  108  CO2  27  25  26   BUN  24*  21*  23*  CREATININE  1.28*  1.25*  1.01*  GLUCOSE  95  90  86    Electrolytes Recent Labs     01/10/16  0500  01/11/16  0413  01/12/16  0326  CALCIUM  8.1*  8.2*  8.5*  MG  1.5*  1.9   --   PHOS  2.1*  2.1*   --     Cardiac Enzymes Recent Labs     01/09/16  1050  TROPONINI  2.11*    Glucose Recent Labs     01/10/16  2013  01/11/16  0006  01/11/16  0410  01/11/16  0737  01/11/16  1222  01/11/16  1727  GLUCAP  146*  96  93  81  114*  139*    Imaging Dg Chest 1 View  Result Date: 01/11/2016 CLINICAL DATA:  75 year old female  status post ultrasound-guided left-sided thoracentesis. EXAM: CHEST 1 VIEW COMPARISON:  Chest x-ray 01/11/2016. FINDINGS: Compared to the prior examination, the previously noted left-sided pleural effusion has been nearly completely drained, with only a trace amount of residual left-sided pleural fluid noted on this study. No definite pneumothorax appreciated. Small right pleural effusion which may be partially loculated. Linear opacities throughout the right mid to lower lung, similar to the prior examination, likely to reflect a combination of subsegmental atelectasis and/or scarring. No definite acute consolidative airspace disease. No evidence of pulmonary edema. Mild cardiomegaly. The patient is rotated to the left on today's exam, resulting in distortion of the mediastinal contours and reduced diagnostic sensitivity and specificity for mediastinal pathology. Atherosclerosis in the thoracic aorta. IMPRESSION: 1. Trace residual left pleural effusion following thoracentesis, with no evidence of pneumothorax or other acute complications. 2. Otherwise, the radiographic appearance the chest is essentially unchanged, as  above. Electronically Signed   By: Trudie Reedaniel  Entrikin M.D.   On: 01/11/2016 16:31   Dg Chest 2 View  Result Date: 01/11/2016 CLINICAL DATA:  Follow-up of a right pleural effusion EXAM: CHEST  2 VIEW COMPARISON:  Portable chest x-ray of January 09, 2016 FINDINGS: The right pleural effusion is stable. On the left the small effusion has increased in conspicuity. There is increased density in the retrocardiac region on the left. There is no pneumothorax. The heart is top-normal in size. The central pulmonary vascularity is prominent. There is calcification in the wall of the aortic arch. There is stable dextrocurvature centered in the mid thoracic spine. The right internal jugular venous catheter has been removed. IMPRESSION: Stable right pleural effusion. Mildly increased left pleural effusion.  Increased left lower lobe atelectasis or pneumonia. Borderline cardiomegaly with central pulmonary vascular prominence but no pulmonary edema. Aortic atherosclerosis. Electronically Signed   By: David  SwazilandJordan M.D.   On: 01/11/2016 10:19   [place] Thoracentesis Asp Pleural Space W/img Guide  Result Date: 01/11/2016 INDICATION: Pneumonia, gastroenteritis, bilateral pleural effusions. Request made for diagnostic and therapeutic left thoracentesis. EXAM: ULTRASOUND GUIDED DIAGNOSTIC AND THERAPEUTIC LEFT THORACENTESIS MEDICATIONS: None. COMPLICATIONS: None immediate. PROCEDURE: An ultrasound guided thoracentesis was thoroughly discussed with the patient and questions answered. The benefits, risks, alternatives and complications were also discussed. The patient understands and wishes to proceed with the procedure. Written consent was obtained. Ultrasound was performed to localize and mark an adequate pocket of fluid in the left chest. The area was then prepped and draped in the normal sterile fashion. 1% Lidocaine was used for local anesthesia. Under ultrasound guidance a 19 gauge, 7-cm, Yueh catheter was introduced. Thoracentesis was performed. The catheter was removed and a dressing applied. FINDINGS: A total of approximately 180 cc of slightly hazy, light yellow fluid was removed. Samples were sent to the laboratory as requested by the clinical team. IMPRESSION: Successful ultrasound guided diagnostic and therapeutic left thoracentesis yielding 180 cc of pleural fluid. Read by: Jeananne RamaKevin Allred, PA-C Electronically Signed   By: Judie PetitM.  Shick M.D.   On: 01/11/2016 16:25    Studies:  CT abd/pelvis 11/10 >> small b/l effusions, atherosclerosis Echo 11/13 >> EF 65 to 70%, grade 1 DD, mod RV dilation, severe TR, PAS 52 mmHg, mild/mod pericardial effusion Lt thoracentesis 11/13 >> glucose 155, protein < 3, LDH 138, WBC 758 (50L, 59M, 11E)  Cultures: GI panel 11/10 >> negative C diff PCR 11/10 >> negative Urine 11/10 >>  negative Blood 11/10 >>  Lt pleural fluid 11/13 >>  Antibiotics: Ceftriaxone 11/10 >> Vancomycin 11/10 >> 11/10  Significant events: 11/10 Admit 11/12 To telemetry  Lines/tubes: RIJ HD cath 11/10 >> 11/12  Assessment/plan:  Gastroenteritis. - day 5 of rocephin  NSTEMI in setting of septic/hypovolemic shock. Pulmonary hypertension. Hx of HTN. - ASA - V/Q scan - continue lopressor - hold outpt lisinopril/HCTZ for now until renal fx stabilized more  Right pleural effusion. - right thoracentesis 11/14  Anemia of critical illness. - f/u CBC intermittently  Hyperglycemia >> improved; reports hx of DM, but not on meds as outpt. - monitor blood sugar on BMET - check HbA1C  Hx of gout. - hold outpt allopurinol for now  Resolved problems >> Septic/hypovolemic shock, lactic acidosis, AKI, hyperkalemia, anion gap metabolic acidosis, acute hypoxic respiratory failure  DVT prophylaxis - SQ heparin SUP - not indicated Goals of care - DNR  Coralyn HellingVineet Kilian Schwartz, MD [facility] 01/12/2016, 9:18 AM Pager:  [phone] After  3pm call: [phone]

## 2016-01-12 NOTE — Progress Notes (Addendum)
eLink Physician-Brief Progress Note Patient Name: Glenda Blankenship DOB: 08/26/1940 MRN: [id number]   Date of Service  01/12/2016  HPI/Events of Note  Chest CTA >> bilateral PE with R heart strain. BP = 136/73, Sat = 92% and RR = 19.  eICU Interventions  Will order: 1. Heparin IV infusion per pharmacy consultation. 2. Duplex US bilateral LE's - r/0 DVT.     Intervention Category Intermediate Interventions: Diagnostic test evaluation  [name],[name] 01/12/2016, 8:37 PM

## 2016-01-12 NOTE — Progress Notes (Signed)
ANTICOAGULATION CONSULT NOTE - Initial Consult  Pharmacy Consult for Heparin Indication: pulmonary embolus  No Known Allergies  Patient Measurements: Height: 5\' 2"  (157.5 cm) Weight: 169 lb 14.4 oz (77.1 kg) IBW/kg (Calculated) : 50.1 Heparin Dosing Weight: 67 kg  Vital Signs: Temp: 98.2 F (36.8 C) (11/14 1403) Temp Source: Oral (11/14 1403) BP: 136/73 (11/14 1403) Pulse Rate: 88 (11/14 1403)  Labs:  Recent Labs  01/10/16 0500 01/11/16 0413 01/12/16 0326  HGB 9.4* 9.6* 9.3*  HCT 29.5* 30.0* 29.3*  PLT 120* 142* 176  CREATININE 1.28* 1.25* 1.01*    Estimated Creatinine Clearance: 46.3 mL/min (by C-G formula based on SCr of 1.01 mg/dL (H)).   Medical History: Past Medical History:  Diagnosis Date  . Diabetes [facility](HCC)     Assessment: [age] yr female admitted on 11/10 and known to pharmacy from Ceftriaxone dosing. Patient s/p thoracentesis and CTAngio performed to assess for pulmonary embolism due noted elevated pulmonary pressure.   CTAngio reported as bilateral PE with right heart strain  Pharmacy consulted to begin IV heparin  Patient had been on heparin 5000 units sq q8h for VTE prophylaxis.  Last dose given 01/12/16 @ 14:59  Goal of Therapy:  Heparin level 0.3-0.7 units/ml Monitor platelets by anticoagulation protocol: Yes   Plan:   Heparin 3000 unit IV bolus x 1 followed by heparin infusion @ 1100 units/hr  Check heparin level 8 hr after heparin started  Follow heparin level & CBC daily  Shauntia Levengood, Joselyn GlassmanLeann Trefz, PharmD 01/12/2016,8:41 PM

## 2016-01-12 NOTE — Procedures (Signed)
Visualized right pleural space with pleural fluid.  Attempted thoracentesis in right mid scapular line 7 th rib space.  Entered pleural space, but only was able to withdraw minimal amount of bloody appearing fluid.  Confirmed location of needle placement again with ultrasound.  Procedure aborted.  Will arrange for CT angio chest to assess for pulmonary embolism in setting of elevated pulmonary pressures, and further assess right pleural space.  Coralyn HellingVineet Shynia Daleo, MD [facility] 01/12/2016, 9:55 AM Pager:  [phone] After 3pm call: [phone]

## 2016-01-13 ENCOUNTER — Inpatient Hospital Stay (HOSPITAL_COMMUNITY): Payer: Medicare Other

## 2016-01-13 DIAGNOSIS — R0602 Shortness of breath: Secondary | ICD-10-CM

## 2016-01-13 DIAGNOSIS — I2609 Other pulmonary embolism with acute cor pulmonale: Secondary | ICD-10-CM

## 2016-01-13 DIAGNOSIS — I2699 Other pulmonary embolism without acute cor pulmonale: Secondary | ICD-10-CM

## 2016-01-13 LAB — CBC
HCT: 33.7 % — ABNORMAL LOW (ref 36.0–46.0)
HEMOGLOBIN: 10.7 g/dL — AB (ref 12.0–15.0)
MCH: 28.2 pg (ref 26.0–34.0)
MCHC: 31.8 g/dL (ref 30.0–36.0)
MCV: 88.7 fL (ref 78.0–100.0)
PLATELETS: 205 10*3/uL (ref 150–400)
RBC: 3.8 MIL/uL — AB (ref 3.87–5.11)
RDW: 14.8 % (ref 11.5–15.5)
WBC: 6.9 10*3/uL (ref 4.0–10.5)

## 2016-01-13 LAB — CULTURE, BLOOD (ROUTINE X 2): CULTURE: NO GROWTH

## 2016-01-13 LAB — HEMOGLOBIN A1C
HEMOGLOBIN A1C: 6.3 % — AB (ref 4.8–5.6)
MEAN PLASMA GLUCOSE: 134 mg/dL

## 2016-01-13 LAB — HEPARIN LEVEL (UNFRACTIONATED)
HEPARIN UNFRACTIONATED: 1.2 [IU]/mL — AB (ref 0.30–0.70)
HEPARIN UNFRACTIONATED: 0.88 [IU]/mL — AB (ref 0.30–0.70)
HEPARIN UNFRACTIONATED: 0.58 [IU]/mL (ref 0.30–0.70)

## 2016-01-13 MED ORDER — HEPARIN (PORCINE) IN NACL 100-0.45 UNIT/ML-% IJ SOLN
950.0000 [IU]/h | INTRAMUSCULAR | Status: DC
  Administered 2016-01-13 (×2): 950 [IU]/h via INTRAVENOUS
  Filled 2016-01-13: qty 250

## 2016-01-13 MED ORDER — HEPARIN (PORCINE) IN NACL 100-0.45 UNIT/ML-% IJ SOLN
700.0000 [IU]/h | INTRAMUSCULAR | Status: DC
  Administered 2016-01-13: 700 [IU]/h via INTRAVENOUS

## 2016-01-13 NOTE — Progress Notes (Signed)
VASCULAR LAB PRELIMINARY  PRELIMINARY  PRELIMINARY  PRELIMINARY  Bilateral lower extremity venous duplex completed.    Preliminary report:  Right:  DVT noted in the popliteal and posterior tibial veins .  No evidence of superficial thrombosis.  No Baker's cyst. Left:  No evidence of DVT, superficial thrombosis, or Baker's cyst.  Amey Hossain, RVS 01/13/2016, 4:05 PM

## 2016-01-13 NOTE — Progress Notes (Signed)
PULMONARY / CRITICAL CARE MEDICINE   Name: Glenda Blankenship MRN: [id number] DOB: 02/05/1941    Admission date:  01/07/2016  Referring provider:  Dr. Rhunette CroftNanavati  Chief complaint:  Vomiting  Summary:   75 yo female with 1 day of nausea and vomiting likely from gastroenteritis after eating undercooked chicken.  She was hypotensive in ER from septic and hypovolemic shock.  Found to have elevated lactic acid, procalcitonin, troponin, and renal failure. Follow up ECHO revealed pulmonary hypertension & RV dilation.  Attempted R thoracentesis for effusion but small amt bloody fluid returned.  Follow up CTA Chest was positive for PE.      Subjective: Pt reports no acute complaints.  Understands diagnosis of PE and need for heparin.    Vital signs: BP (!) 174/68 (BP Location: Left Arm)   Pulse 98   Temp 98.6 F (37 C) (Oral)   Resp 20   Ht 5\' 2"  (1.575 m)   Wt 165 lb 11.2 oz (75.2 kg)   SpO2 95%   BMI 30.31 kg/m   Intake/output: I/O last 3 completed shifts: In: 410 [P.O.:360; IV Piggyback:50] Out: 1100 [Urine:1100]  Physical exam: General:  Pleasant  Neuro: normal strength HEENT: no stridor Cardiovascular: regular, no murmur Lungs: decreased BS at bases, few crackles on R Abdomen:  Soft, non-tender Musculoskeletal: no edema Skin: no rashes   CBC Recent Labs     01/11/16  0413  01/12/16  0326  01/13/16  0345  WBC  6.9  6.3  6.9  HGB  9.6*  9.3*  10.7*  HCT  30.0*  29.3*  33.7*  PLT  142*  176  205    BMET Recent Labs     01/11/16  0413  01/12/16  0326  NA  140  143  K  3.6  3.5  CL  108  108  CO2  25  26  BUN  21*  23*  CREATININE  1.25*  1.01*  GLUCOSE  90  86    Electrolytes Recent Labs     01/11/16  0413  01/12/16  0326  CALCIUM  8.2*  8.5*  MG  1.9   --   PHOS  2.1*   --     Cardiac Enzymes No results for input(s): TROPONINI, PROBNP in the last 72 hours.  Glucose Recent Labs     01/10/16  2013  01/11/16  0006  01/11/16  0410  01/11/16  0737   01/11/16  1222  01/11/16  1727  GLUCAP  146*  96  93  81  114*  139*    Imaging Dg Chest 1 View  Result Date: 01/11/2016 CLINICAL DATA:  75 year old female status post ultrasound-guided left-sided thoracentesis. EXAM: CHEST 1 VIEW COMPARISON:  Chest x-ray 01/11/2016. FINDINGS: Compared to the prior examination, the previously noted left-sided pleural effusion has been nearly completely drained, with only a trace amount of residual left-sided pleural fluid noted on this study. No definite pneumothorax appreciated. Small right pleural effusion which may be partially loculated. Linear opacities throughout the right mid to lower lung, similar to the prior examination, likely to reflect a combination of subsegmental atelectasis and/or scarring. No definite acute consolidative airspace disease. No evidence of pulmonary edema. Mild cardiomegaly. The patient is rotated to the left on today's exam, resulting in distortion of the mediastinal contours and reduced diagnostic sensitivity and specificity for mediastinal pathology. Atherosclerosis in the thoracic aorta. IMPRESSION: 1. Trace residual left pleural effusion following thoracentesis, with no evidence of  pneumothorax or other acute complications. 2. Otherwise, the radiographic appearance the chest is essentially unchanged, as above. Electronically Signed   By: Trudie Reed M.D.   On: 01/11/2016 16:31   Dg Chest 2 View  Result Date: 01/11/2016 CLINICAL DATA:  Follow-up of a right pleural effusion EXAM: CHEST  2 VIEW COMPARISON:  Portable chest x-ray of January 09, 2016 FINDINGS: The right pleural effusion is stable. On the left the small effusion has increased in conspicuity. There is increased density in the retrocardiac region on the left. There is no pneumothorax. The heart is top-normal in size. The central pulmonary vascularity is prominent. There is calcification in the wall of the aortic arch. There is stable dextrocurvature centered in the mid  thoracic spine. The right internal jugular venous catheter has been removed. IMPRESSION: Stable right pleural effusion. Mildly increased left pleural effusion. Increased left lower lobe atelectasis or pneumonia. Borderline cardiomegaly with central pulmonary vascular prominence but no pulmonary edema. Aortic atherosclerosis. Electronically Signed   By: David  [place] M.D.   On: 01/11/2016 10:19   Ct Angio Chest Pe W Or Wo Contrast  Result Date: 01/12/2016 CLINICAL DATA:  Pulmonary hypertension. EXAM: CT ANGIOGRAPHY CHEST WITH CONTRAST TECHNIQUE: Multidetector CT imaging of the chest was performed using the standard protocol during bolus administration of intravenous contrast. Multiplanar CT image reconstructions and MIPs were obtained to evaluate the vascular anatomy. CONTRAST:  100 mL Isovue 370 COMPARISON:  Chest radiograph 01/11/2016.  Abdominal CT 01/08/2016. FINDINGS: Cardiovascular: Pulmonary arterial opacification is adequate. There is saddle pulmonary embolus with clot extending into all lobes bilaterally. The right atrium is enlarged. The right ventricle/ left ventricle ratio is abnormal at 1.1 with flattening of the interventricular septum. There is a small pericardial effusion which is similar to that partially visualized on the prior abdominal CT. There is thoracic aortic atherosclerosis without aneurysm. Mediastinum/Nodes: No enlarged axillary, mediastinal, or hilar lymph nodes. Grossly unremarkable thyroid and esophagus. Trachea configuration related to expiratory phase imaging. Lungs/Pleura: There is a small layering left pleural effusion. There is also a small right pleural effusion which is partially loculated. No pneumothorax. Respiratory motion artifact with atelectasis bilaterally as well as scarring in the right lung. Upper Abdomen: Small volume ascites in the upper abdomen as demonstrated on prior CT. Musculoskeletal: Postsurgical deformities of the right lateral fifth and sixth ribs.  Review of the MIP images confirms the above findings. IMPRESSION: 1. Positive for acute PE with saddle embolus and CT evidence of right heart strain (RV/LV Ratio = 1.1) consistent with at least submassive (intermediate risk) PE. The presence of right heart strain has been associated with an increased risk of morbidity and mortality. 2. Small pleural effusions, partially loculated on the right. 3. Bilateral atelectasis.  Right lung scarring. 4. Aortic atherosclerosis. 5. Small pericardial effusion. 6. Small volume upper abdominal ascites. These results were called by telephone at the time of interpretation on 01/12/2016 at 8:33 pm to Dr. Arsenio Loader, who verbally acknowledged these results. Electronically Signed   By: Sebastian Ache M.D.   On: 01/12/2016 20:39   US Thoracentesis Asp Pleural Space W/img Guide  Result Date: 01/11/2016 INDICATION: Pneumonia, gastroenteritis, bilateral pleural effusions. Request made for diagnostic and therapeutic left thoracentesis. EXAM: ULTRASOUND GUIDED DIAGNOSTIC AND THERAPEUTIC LEFT THORACENTESIS MEDICATIONS: None. COMPLICATIONS: None immediate. PROCEDURE: An ultrasound guided thoracentesis was thoroughly discussed with the patient and questions answered. The benefits, risks, alternatives and complications were also discussed. The patient understands and wishes to proceed with the procedure. Written consent was  obtained. Ultrasound was performed to localize and mark an adequate pocket of fluid in the left chest. The area was then prepped and draped in the normal sterile fashion. 1% Lidocaine was used for local anesthesia. Under ultrasound guidance a 19 gauge, 7-cm, Yueh catheter was introduced. Thoracentesis was performed. The catheter was removed and a dressing applied. FINDINGS: A total of approximately 180 cc of slightly hazy, light yellow fluid was removed. Samples were sent to the laboratory as requested by the clinical team. IMPRESSION: Successful ultrasound guided diagnostic  and therapeutic left thoracentesis yielding 180 cc of pleural fluid. Read by: Jeananne RamaKevin Allred, PA-C Electronically Signed   By: Judie PetitM.  Shick M.D.   On: 01/11/2016 16:25    Studies:  CT abd/pelvis 11/10 >> small b/l effusions, atherosclerosis Echo 11/13 >> EF 65 to 70%, grade 1 DD, mod RV dilation, severe TR, PAS 52 mmHg, mild/mod pericardial effusion Lt thoracentesis 11/13 >> glucose 155, protein < 3, LDH 138, WBC 758 (50L, 52M, 11E) CTA Chest >> acute saddle PE with evidence of RV strain (ratio 1:1), small pleural effusions, partially loculated on R, small pericardial effusion LE Duplex 11/15 >.   Cultures: GI panel 11/10 >> negative C diff PCR 11/10 >> negative Urine 11/10 >> negative Blood 11/10 >> negative Lt pleural fluid 11/13 >>   Antibiotics: Ceftriaxone 11/10 >> Vancomycin 11/10 >> 11/10  Significant events: 11/10 Admit 11/12 To telemetry 11/14 Attempted R thoracentesis, small amt bloody fluid returned.   CTA positive for PE  Lines/tubes: RIJ HD cath 11/10 >> 11/12  Assessment/plan:  Gastroenteritis. - day 6/7 of rocephin   NSTEMI in setting of septic/hypovolemic shock, PE. Pulmonary hypertension in setting of saddle PE Hx of HTN. - ASA - continue lopressor - hold outpt lisinopril/HCTZ for now until renal fx stabilized more  Saddle Pulmonary Embolism >> suspect provoked with recent illness but with pleural / pericardial effusions, consider autoimmune process - heparin gtt per pharmacy  - transition to NOAC in am, will ask CM to verify insurance benefits for patient - follow up LE dopplers  - assess ANA, RF  Right pleural effusion. - monitor, partially loculated  Anemia of critical illness. - f/u CBC intermittently  Hyperglycemia >> improved; reports hx of DM, but not on meds as outpt. Hgb A1c 6.3 - monitor blood sugar on BMET  Hx of gout. - hold outpt allopurinol for now  Resolved problems >> Septic/hypovolemic shock, lactic acidosis, AKI, hyperkalemia,  anion gap metabolic acidosis, acute hypoxic respiratory failure  DVT prophylaxis - SQ heparin SUP - not indicated Goals of care - DNR   Canary BrimBrandi Ollis, NP-C  Pulmonary & Critical Care Pgr: [phone] or if no answer [phone] 01/13/2016, 8:54 AM  Found to have PE.  Has intermittent pain in her Lt side.  Breathing okay.  Pleasant. HR regular,  No wheeze.  Hb 10.7, PLT 205 HbA1c 6.3  Assessment/plan:  Acute PE. - will have case manager determine what options we have for oral anticoagulation - continue heparin gtt for now - f/u doppler legs  Gastroenteritis. - continue rocephin  B/l pleural effusions, pericardial effusion. - check ANA, RF  Coralyn HellingVineet Alaiah Lundy, MD [facility] 01/13/2016, 9:41 AM Pager:  [phone] After 3pm call: [phone]

## 2016-01-13 NOTE — Progress Notes (Signed)
Pharmacy - IV heparin  Assessment:    Please see note from Laurena SlimmerJulian Grimsley, PharmD earlier today for full details.  Briefly, 75 y.o. female on IV heparin for PE.    1417 HL=0.88 on 950 units/hr  2258 HL=0.58 on 700 units/hr, at goal  No bleeding or infusion issues per RN  Plan:   Continue heparin drip at 700 units/hr  Recheck heparin level with am labs   Lorenza EvangelistGreen, Elynn Patteson R 01/13/2016, 11:38 PM

## 2016-01-13 NOTE — Progress Notes (Addendum)
Pharmacy - IV heparin  Assessment:    Please see note from Laurena SlimmerJulian Grimsley, PharmD earlier today for full details.  Briefly, 75 y.o. female on IV heparin for PE. First level high and rate reduced.   Most recent heparin level still supratherapeutic (0.88) but lower on 950 units/hr  No bleeding or infusion issues per RN  Plan:   Reduce heparin infusion to 700 units/hr  Recheck heparin level in 8 hrs  Bernadene Personrew Johnnetta Holstine, PharmD, BCPS Pager: [phone] 01/13/2016, 6:50 PM

## 2016-01-13 NOTE — Progress Notes (Signed)
eLink Physician-Brief Progress Note Patient Name: Glenda ChenMarie J Blankenship DOB: 06/27/1940 MRN: [id number]   Date of Service  01/13/2016  HPI/Events of Note  Duplex US of Bilateral LE's - Findings c/w acute deep vein thrombosis involving the posterior tibial and popliteal veins of the right lower extremity. O2 sat = 96% and RR = 19. Currently on a Heparin IV infusion.   eICU Interventions  Continue present management.      Intervention Category Intermediate Interventions: Diagnostic test evaluation  Sommer,[name] 01/13/2016, 6:09 PM

## 2016-01-13 NOTE — Progress Notes (Signed)
ANTICOAGULATION CONSULT NOTE - Follow Up Consult  Pharmacy Consult for Heparin Indication: pulmonary embolus  No Known Allergies  Patient Measurements: Height: 5\' 2"  (157.5 cm) Weight: 169 lb 14.4 oz (77.1 kg) IBW/kg (Calculated) : 50.1 Heparin Dosing Weight:   Vital Signs: Temp: 98 F (36.7 C) (11/14 2205) Temp Source: Oral (11/14 2205) BP: 163/78 (11/14 2205) Pulse Rate: 102 (11/14 2205)  Labs:  Recent Labs  01/11/16 0413 01/12/16 0326 01/13/16 0345  HGB 9.6* 9.3* 10.7*  HCT 30.0* 29.3* 33.7*  PLT 142* 176 205  HEPARINUNFRC  --   --  1.20*  CREATININE 1.25* 1.01*  --     Estimated Creatinine Clearance: 46.3 mL/min (by C-G formula based on SCr of 1.01 mg/dL (H)).   Medications:  Infusions:  . heparin 1,100 Units/hr (01/12/16 2055)    Assessment: Patient with heparin level above goal.  RN noted issue with heparin infusion at beginning of therapy, but no issues after that.  RN also confirmed with patient that level drawn correctly.    Goal of Therapy:  Heparin level 0.3-0.7 units/ml Monitor platelets by anticoagulation protocol: Yes   Plan:  Hold heparin x ~30 mins At 0600, resume heparin at 950 units/hr Recheck level at [address]1400  Abdulai Blaylock Jr, [place] Crow[place] 01/13/2016,5:23 AM

## 2016-01-13 NOTE — Progress Notes (Signed)
Eliquis co pay is $59.06 will need prior authorization [id number] from MD.   Xarelto $95.00 co pay no prior authorization.

## 2016-01-14 DIAGNOSIS — I2602 Saddle embolus of pulmonary artery with acute cor pulmonale: Secondary | ICD-10-CM

## 2016-01-14 DIAGNOSIS — J90 Pleural effusion, not elsewhere classified: Secondary | ICD-10-CM

## 2016-01-14 DIAGNOSIS — E875 Hyperkalemia: Secondary | ICD-10-CM

## 2016-01-14 DIAGNOSIS — I82491 Acute embolism and thrombosis of other specified deep vein of right lower extremity: Secondary | ICD-10-CM

## 2016-01-14 DIAGNOSIS — A419 Sepsis, unspecified organism: Principal | ICD-10-CM

## 2016-01-14 DIAGNOSIS — R6521 Severe sepsis with septic shock: Secondary | ICD-10-CM

## 2016-01-14 DIAGNOSIS — K529 Noninfective gastroenteritis and colitis, unspecified: Secondary | ICD-10-CM

## 2016-01-14 DIAGNOSIS — I82409 Acute embolism and thrombosis of unspecified deep veins of unspecified lower extremity: Secondary | ICD-10-CM

## 2016-01-14 LAB — CBC
HCT: 32.1 % — ABNORMAL LOW (ref 36.0–46.0)
HEMOGLOBIN: 10.3 g/dL — AB (ref 12.0–15.0)
MCH: 28.4 pg (ref 26.0–34.0)
MCHC: 32.1 g/dL (ref 30.0–36.0)
MCV: 88.4 fL (ref 78.0–100.0)
Platelets: 192 10*3/uL (ref 150–400)
RBC: 3.63 MIL/uL — AB (ref 3.87–5.11)
RDW: 15 % (ref 11.5–15.5)
WBC: 6.7 10*3/uL (ref 4.0–10.5)

## 2016-01-14 LAB — BASIC METABOLIC PANEL
Anion gap: 9 (ref 5–15)
BUN: 13 mg/dL (ref 6–20)
CHLORIDE: 102 mmol/L (ref 101–111)
CO2: 27 mmol/L (ref 22–32)
CREATININE: 0.9 mg/dL (ref 0.44–1.00)
Calcium: 8.3 mg/dL — ABNORMAL LOW (ref 8.9–10.3)
GFR calc Af Amer: >60 mL/min (ref >60)
GFR calc non Af Amer: >60 mL/min (ref >60)
GLUCOSE: 87 mg/dL (ref 65–99)
POTASSIUM: 3.2 mmol/L — AB (ref 3.5–5.1)
SODIUM: 138 mmol/L (ref 135–145)

## 2016-01-14 LAB — ANTINUCLEAR ANTIBODIES, IFA: ANA Ab, IFA: NEGATIVE

## 2016-01-14 LAB — HEPARIN LEVEL (UNFRACTIONATED): Heparin Unfractionated: 0.46 IU/mL (ref 0.30–0.70)

## 2016-01-14 MED ORDER — APIXABAN 5 MG PO TABS: 5.0000 mg | ORAL_TABLET | Freq: Two times a day (BID) | ORAL | Status: DC

## 2016-01-14 MED ORDER — APIXABAN 5 MG PO TABS: ORAL_TABLET | ORAL | 1 refills | Status: DC

## 2016-01-14 MED ORDER — METOPROLOL TARTRATE 25 MG PO TABS: 25.0000 mg | ORAL_TABLET | Freq: Two times a day (BID) | ORAL | 1 refills | Status: DC

## 2016-01-14 MED ORDER — APIXABAN 5 MG PO TABS
10.0000 mg | ORAL_TABLET | Freq: Two times a day (BID) | ORAL | Status: DC
  Administered 2016-01-14: 10 mg via ORAL
  Filled 2016-01-14: qty 2

## 2016-01-14 MED ORDER — POTASSIUM CHLORIDE CRYS ER 20 MEQ PO TBCR
40.0000 meq | EXTENDED_RELEASE_TABLET | Freq: Once | ORAL | Status: AC
  Administered 2016-01-14: 40 meq via ORAL
  Filled 2016-01-14: qty 2

## 2016-01-14 NOTE — Progress Notes (Signed)
PULMONARY / CRITICAL CARE MEDICINE   Name: Glenda ChenMarie J Dziuba MRN: [id number] DOB: 09/16/1940    Admission date:  01/07/2016  Referring provider:  Dr. Rhunette CroftNanavati  Chief complaint:  Vomiting  Summary:   75 yo female with 1 day of nausea and vomiting likely from gastroenteritis after eating undercooked chicken.  She was hypotensive in ER from septic and hypovolemic shock.  Found to have elevated lactic acid, procalcitonin, troponin, and renal failure. Follow up ECHO revealed pulmonary hypertension & RV dilation.  Attempted R thoracentesis for effusion but small amt bloody fluid returned.  Found to have acute PE and DVT.      Subjective: Pt reports no acute complaints.  Understands diagnosis of PE and need for heparin.    Vital signs: BP (!) 154/65 (BP Location: Left Arm)   Pulse 92   Temp 98.2 F (36.8 C) (Oral)   Resp 18   Ht 5\' 2"  (1.575 m)   Wt 163 lb 4.8 oz (74.1 kg)   SpO2 94%   BMI 29.87 kg/m   Intake/output: I/O last 3 completed shifts: In: 580 [P.O.:480; IV Piggyback:100] Out: 3251 [Urine:3250; Stool:1]  Physical exam: General:  Pleasant but somewhat stunned Neuro: normal strength HEENT: no stridor Cardiovascular: regular, no murmur Lungs: decreased BS at bases, few crackles on R Abdomen:  Soft, non-tender Musculoskeletal: no edema Skin: no rashes   CBC Recent Labs     01/12/16  0326  01/13/16  0345  01/14/16  0453  WBC  6.3  6.9  6.7  HGB  9.3*  10.7*  10.3*  HCT  29.3*  33.7*  32.1*  PLT  176  205  192    BMET Recent Labs     01/12/16  0326  01/14/16  0453  NA  143  138  K  3.5  3.2*  CL  108  102  CO2  26  27  BUN  23*  13  CREATININE  1.01*  0.90  GLUCOSE  86  87    Electrolytes Recent Labs     01/12/16  0326  01/14/16  0453  CALCIUM  8.5*  8.3*    Cardiac Enzymes No results for input(s): TROPONINI, PROBNP in the last 72 hours.  Glucose Recent Labs     01/11/16  1222  01/11/16  1727  GLUCAP  114*  139*    Imaging Ct Angio  Chest Pe W Or Wo Contrast  Result Date: 01/12/2016 CLINICAL DATA:  Pulmonary hypertension. EXAM: CT ANGIOGRAPHY CHEST WITH CONTRAST TECHNIQUE: Multidetector CT imaging of the chest was performed using the standard protocol during bolus administration of intravenous contrast. Multiplanar CT image reconstructions and MIPs were obtained to evaluate the vascular anatomy. CONTRAST:  100 mL Isovue 370 COMPARISON:  Chest radiograph 01/11/2016.  Abdominal CT 01/08/2016. FINDINGS: Cardiovascular: Pulmonary arterial opacification is adequate. There is saddle pulmonary embolus with clot extending into all lobes bilaterally. The right atrium is enlarged. The right ventricle/ left ventricle ratio is abnormal at 1.1 with flattening of the interventricular septum. There is a small pericardial effusion which is similar to that partially visualized on the prior abdominal CT. There is thoracic aortic atherosclerosis without aneurysm. Mediastinum/Nodes: No enlarged axillary, mediastinal, or hilar lymph nodes. Grossly unremarkable thyroid and esophagus. Trachea configuration related to expiratory phase imaging. Lungs/Pleura: There is a small layering left pleural effusion. There is also a small right pleural effusion which is partially loculated. No pneumothorax. Respiratory motion artifact with atelectasis bilaterally as well as scarring in  the right lung. Upper Abdomen: Small volume ascites in the upper abdomen as demonstrated on prior CT. Musculoskeletal: Postsurgical deformities of the right lateral fifth and sixth ribs. Review of the MIP images confirms the above findings. IMPRESSION: 1. Positive for acute PE with saddle embolus and CT evidence of right heart strain (RV/LV Ratio = 1.1) consistent with at least submassive (intermediate risk) PE. The presence of right heart strain has been associated with an increased risk of morbidity and mortality. 2. Small pleural effusions, partially loculated on the right. 3. Bilateral  atelectasis.  Right lung scarring. 4. Aortic atherosclerosis. 5. Small pericardial effusion. 6. Small volume upper abdominal ascites. These results were called by telephone at the time of interpretation on 01/12/2016 at 8:33 pm to Dr. Arsenio LoaderSommer, who verbally acknowledged these results. Electronically Signed   By: Sebastian AcheAllen  Grady M.D.   On: 01/12/2016 20:39    Studies:  CT abd/pelvis 11/10 >> small b/l effusions, atherosclerosis Echo 11/13 >> EF 65 to 70%, grade 1 DD, mod RV dilation, severe TR, PAS 52 mmHg, mild/mod pericardial effusion Lt thoracentesis 11/13 >> glucose 155, protein < 3, LDH 138, WBC 758 (50L, 83M, 11E) CTA Chest >> acute saddle PE with evidence of RV strain (ratio 1:1), small pleural effusions, partially loculated on R, small pericardial effusion B/l LE Duplex 11/15 >> DVT Rt popliteal, posterior tibial veins  Cultures: GI panel 11/10 >> negative C diff PCR 11/10 >> negative Urine 11/10 >> negative Blood 11/10 >> negative Lt pleural fluid 11/13 >>   Antibiotics: Ceftriaxone 11/10 >> 11/16 Vancomycin 11/10 >> 11/10  Significant events: 11/10 Admit 11/12 To telemetry 11/14 Attempted R thoracentesis.  Start heparin gtt for PE 11/16 change to eliquis  Lines/tubes: RIJ HD cath 11/10 >> 11/12  Assessment/plan:  Gastroenteritis. - day 7/7 of rocephin, will dc 11/16  NSTEMI in setting of septic/hypovolemic shock, PE. Pulmonary hypertension in setting of saddle PE, Rt leg DVT. Hx of HTN. - ASA - continue lopressor - transition from heparin gtt to eliquis 11/16 >> confirmation number for eliquis [id number], approved through Jul 15, 2016 - hold outpt lisinopril/HCTZ for now >> re-assess as outpt  B/l pleural effusions, pericardial effusion. - f/u ANA, RF from 11/16  Anemia of critical illness. - f/u CBC intermittently  Hyperglycemia >> improved; reports hx of DM, but not on meds as outpt. Hgb A1c 6.3 - monitor blood sugar on BMET  Hx of gout. - hold outpt  allopurinol for now  Resolved problems >> Septic/hypovolemic shock, lactic acidosis, AKI, hyperkalemia, anion gap metabolic acidosis, acute hypoxic respiratory failure  DVT prophylaxis - heparin gtt with transition to eliquis SUP - not indicated Goals of care - DNR   Brett CanalesSteve Minor ACNP [facility] Pager [phone] till 3 pm If no answer page [phone] 01/14/2016, 8:58 AM   Still has intermittent Lt sided chest pain.  Otherwise feels better.  HR regular.  Faint crackles at bases.  Abd soft.  Assessment/plan: Acute PE, Rt leg DVT. - d/w optum rx for prior authorization of eliquis >> approved - transition from heparin to eliquis per pharmacy  Pericardial effusion, b/l pleural effusions. - f/u ANA, RF - will need f/u Echo and CXR as outpt   Sepsis from gastroenteritis. - d/c rocephin after dose on 11/16  Okay to d/c home once she is set up with eliquis.    Coralyn HellingVineet Shaurya Rawdon, MD [facility] 01/14/2016, 10:05 AM Pager:  [phone] After 3pm call: [phone]

## 2016-01-14 NOTE — Progress Notes (Addendum)
ANTICOAGULATION CONSULT NOTE - Follow Up Consult  Pharmacy Consult for Heparin Indication: pulmonary embolus  No Known Allergies  Patient Measurements: Height: 5\' 2"  (157.5 cm) Weight: 163 lb 4.8 oz (74.1 kg) IBW/kg (Calculated) : 50.1 Heparin Dosing Weight:   Vital Signs: Temp: 98.2 F (36.8 C) (11/16 0536) Temp Source: Oral (11/16 0536) BP: 154/65 (11/16 0536) Pulse Rate: 92 (11/16 0536)  Labs:  Recent Labs  01/12/16 0326  01/13/16 0345 01/13/16 1417 01/13/16 2258 01/14/16 0453  HGB 9.3*  --  10.7*  --   --  10.3*  HCT 29.3*  --  33.7*  --   --  32.1*  PLT 176  --  205  --   --  192  HEPARINUNFRC  --   < > 1.20* 0.88* 0.58 0.46  CREATININE 1.01*  --   --   --   --  0.90  < > = values in this interval not displayed.  Estimated Creatinine Clearance: 50.9 mL/min (by C-G formula based on SCr of 0.9 mg/dL).   Medications:  Infusions:  . heparin 700 Units/hr (01/13/16 1918)    Assessment: Heparin level at goal.  No bleeding noted.     Goal of Therapy:  Heparin level 0.3-0.7 units/ml Monitor platelets by anticoagulation protocol: Yes   Plan:  Continue heparin at 700 units/hr Daily heparin level & CBC F/U long-term OAC plan  Junita PushMichelle Denia Mcvicar, PharmD, BCPS Pager: [phone] 01/14/2016,7:53 AM  Addendum: IV heparin now changed to Eliquis for outpatient treatment of VTE. 1) Eliquis 10mg  po BID x1 week followed by 5mg  PO BID for at least 3 months 2) D/C heparin & heparin labs 3) Provide patient education on Eliquis  Junita PushMichelle Edgardo Petrenko, PharmD, BCPS Pager: [phone] 01/14/2016@10 :04 AM

## 2016-01-14 NOTE — Discharge Summary (Signed)
Physician Discharge Summary  Patient ID: Danae ChenMarie J Klapper MRN: [id number] DOB/AGE: 75/04/1940 75 y.o.  Admit date: 01/07/2016 Discharge date: 01/14/2016  Problem List Nausea with vomiting Septic shock Hypovolemic shock NSTEMI Acute saddle pulmonary embolism with acute cor pulmonale Right leg DVT Bilateral pleural effusions Pericardial effusion Acute renal failure Hyperkalemia Metabolic acidosis Anemia of critical illness Hyperglycemia DNR  HPI: 75 year old female with past medical history of diabetes and hypertension. She was in her usual state of health until the morning of 01/07/2016 when she awoke with a feeling of malaise. She proceeded to have several episodes of diarrhea (estimated 15 episodes) throughout the course of the day. Initially it was yellowish in color but eventually progressed to very watery diarrhea. She complained of nausea, however, she did not experience any vomiting. For dinner the night before she ate cabbage, potatoes, and chicken. She did not check to check in with a meat thermometer to ensure internal temperature had reached a safe range. She denied abdominal pain. As the day went on she began to experience lightheadedness and dizziness when she would stand up. For these reasons [facility]. In the emergency department she was found to be severely hypotensive with systolic pressure getting to be as low as 54. She was aggressively resuscitated with IV fluids. Initial lactic acid 5.68. Additional laboratory evaluation significant for WBC 8.3, hemoglobin 11, potassium 5.6, serum creatinine 2.3 with unknown baseline, and glucose 165. She was having chills at home, but no fever noted in the emergency department. She was started on broad-spectrum antibiotics. After 4 L of IV fluid resuscitation she remained hypotensive. PCCM asked to admit. Of note she denies recent antibiotic use.  Hospital Course:  Vital signs: BP  (!) 154/65 (BP Location: Left Arm)   Pulse 92   Temp 98.2 F (36.8 C) (Oral)   Resp 18   Ht 5\' 2"  (1.575 m)   Wt 163 lb 4.8 oz (74.1 kg)   SpO2 94%   BMI 29.87 kg/m   Intake/output: I/O last 3 completed shifts: In: 580 [P.O.:480; IV Piggyback:100] Out: 3251 [Urine:3250; Stool:1]  Physical exam: General:  Pleasant but somewhat stunned Neuro: normal strength HEENT: no stridor Cardiovascular: regular, no murmur Lungs: decreased BS at bases, few crackles on R Abdomen:  Soft, non-tender Musculoskeletal: no edema Skin: no rashes  Studies:  CT abd/pelvis 11/10 >> small b/l effusions, atherosclerosis Echo 11/13 >> EF 65 to 70%, grade 1 DD, mod RV dilation, severe TR, PAS 52 mmHg, mild/mod pericardial effusion Lt thoracentesis 11/13 >> glucose 155, protein < 3, LDH 138, WBC 758 (50L, 23M, 11E) CTA Chest >> acute saddle PE with evidence of RV strain (ratio 1:1), small pleural effusions, partially loculated on R, small pericardial effusion B/l LE Duplex 11/15 >> DVT Rt popliteal, posterior tibial veins  Cultures: GI panel 11/10 >> negative C diff PCR 11/10 >> negative Urine 11/10 >> negative Blood 11/10 >> negative Lt pleural fluid 11/13 >>   Antibiotics: Ceftriaxone 11/10 >> 11/16 Vancomycin 11/10 >> 11/10  Significant events: 11/10 Admit 11/12 To telemetry 11/14 Attempted R thoracentesis.  Start heparin gtt for PE 11/16 change to eliquis, discharge home  Lines/tubes: RIJ HD cath 11/10 >> 11/12  Assessment/plan:  Gastroenteritis. - day 7/7 of rocephin, dc'ed 11/16  NSTEMI in setting of septic/hypovolemic shock, PE. Pulmonary hypertension in setting of saddle PE, Rt leg DVT. Hx of HTN. - ASA - continue lopressor - transition from heparin gtt to eliquis 11/16 >>  confirmation number for eliquis [id number], approved through Jul 15, 2016 - hold outpt lisinopril/HCTZ for now >> re-assess as outpt  B/l pleural effusions, pericardial effusion. - f/u ANA, RF  from 11/16  Anemia of critical illness. - f/u CBC as outpt  Hyperglycemia >> improved; reports hx of DM, but not on meds as outpt. Hgb A1c 6.3 - reassess as outpt  Hx of gout. - follow up with PCP  Labs at discharge Lab Results  Component Value Date   CREATININE 0.90 01/14/2016   BUN 13 01/14/2016   NA 138 01/14/2016   K 3.2 (L) 01/14/2016   CL 102 01/14/2016   CO2 27 01/14/2016   Lab Results  Component Value Date   WBC 6.7 01/14/2016   HGB 10.3 (L) 01/14/2016   HCT 32.1 (L) 01/14/2016   MCV 88.4 01/14/2016   PLT 192 01/14/2016   Lab Results  Component Value Date   ALT 26 01/07/2016   AST 53 (H) 01/07/2016   ALKPHOS 66 01/07/2016   BILITOT 0.5 01/07/2016   Lab Results  Component Value Date   INR 1.08 01/07/2016    Current radiology studies Ct Angio Chest Pe W Or Wo Contrast  Result Date: 01/12/2016 CLINICAL DATA:  Pulmonary hypertension. EXAM: CT ANGIOGRAPHY CHEST WITH CONTRAST TECHNIQUE: Multidetector CT imaging of the chest was performed using the standard protocol during bolus administration of intravenous contrast. Multiplanar CT image reconstructions and MIPs were obtained to evaluate the vascular anatomy. CONTRAST:  100 mL Isovue 370 COMPARISON:  Chest radiograph 01/11/2016.  Abdominal CT 01/08/2016. FINDINGS: Cardiovascular: Pulmonary arterial opacification is adequate. There is saddle pulmonary embolus with clot extending into all lobes bilaterally. The right atrium is enlarged. The right ventricle/ left ventricle ratio is abnormal at 1.1 with flattening of the interventricular septum. There is a small pericardial effusion which is similar to that partially visualized on the prior abdominal CT. There is thoracic aortic atherosclerosis without aneurysm. Mediastinum/Nodes: No enlarged axillary, mediastinal, or hilar lymph nodes. Grossly unremarkable thyroid and esophagus. Trachea configuration related to expiratory phase imaging. Lungs/Pleura: There is a small  layering left pleural effusion. There is also a small right pleural effusion which is partially loculated. No pneumothorax. Respiratory motion artifact with atelectasis bilaterally as well as scarring in the right lung. Upper Abdomen: Small volume ascites in the upper abdomen as demonstrated on prior CT. Musculoskeletal: Postsurgical deformities of the right lateral fifth and sixth ribs. Review of the MIP images confirms the above findings. IMPRESSION: 1. Positive for acute PE with saddle embolus and CT evidence of right heart strain (RV/LV Ratio = 1.1) consistent with at least submassive (intermediate risk) PE. The presence of right heart strain has been associated with an increased risk of morbidity and mortality. 2. Small pleural effusions, partially loculated on the right. 3. Bilateral atelectasis.  Right lung scarring. 4. Aortic atherosclerosis. 5. Small pericardial effusion. 6. Small volume upper abdominal ascites. These results were called by telephone at the time of interpretation on 01/12/2016 at 8:33 pm to Dr. Arsenio Loader, who verbally acknowledged these results. Electronically Signed   By: Sebastian Ache M.D.   On: 01/12/2016 20:39    Disposition:    Discharge Instructions    Discharge patient    Complete by:  As directed        Medication List    STOP taking these medications   lisinopril-hydrochlorothiazide 20-25 MG tablet Commonly known as:  PRINZIDE,ZESTORETIC   potassium citrate 10 MEQ (1080 MG) SR tablet Commonly known as:  UROCIT-K     TAKE these medications   allopurinol 300 MG tablet Commonly known as:  ZYLOPRIM Take 1 tablet by mouth daily.   apixaban 5 MG Tabs tablet Commonly known as:  ELIQUIS 10 mg bid x 7 days then 5 mg bid and stay at that dose   aspirin 81 MG chewable tablet Chew 81 mg by mouth daily.   metoprolol tartrate 25 MG tablet Commonly known as:  LOPRESSOR Take 1 tablet (25 mg total) by mouth 2 (two) times daily.       Discharged Condition:  fair  Time spent on discharge greater than 40 minutes.  Vital signs at Discharge: Temp:  [98.2 F (36.8 C)-98.7 F (37.1 C)] 98.2 F (36.8 C) (11/16 0536) Pulse Rate:  [92-97] 92 (11/16 0536) Resp:  [18-19] 18 (11/16 0536) BP: (138-156)/(52-66) 154/65 (11/16 0536) SpO2:  [93 %-96 %] 94 % (11/16 0536) Weight:  [163 lb 4.8 oz (74.1 kg)] 163 lb 4.8 oz (74.1 kg) (11/16 0536)   Office follow up Special Information or instructions: She is to follow up with NP Groce. She will need to see Dr. Maurene CapesSood in follow up. On Eliquis for new PE/DVT.  Signed: Brett CanalesSteve Minor ACNP [facility] Pager [phone] till 3 pm If no answer page [phone] 01/14/2016, 11:00 AM   Coralyn HellingVineet Ariyon Gerstenberger, MD [facility] 01/14/2016, 1:04 PM Pager:  [phone] After 3pm call: [phone]

## 2016-01-16 LAB — CULTURE, BODY FLUID W GRAM STAIN -BOTTLE: Culture: NO GROWTH

## 2016-01-16 LAB — CULTURE, BODY FLUID-BOTTLE

## 2016-01-25 ENCOUNTER — Ambulatory Visit (INDEPENDENT_AMBULATORY_CARE_PROVIDER_SITE_OTHER)
Admission: RE | Admit: 2016-01-25 | Discharge: 2016-01-25 | Disposition: A | Discharge status: Home / Self Care | Payer: Medicare Other | Source: Ambulatory Visit | Attending: Acute Care | Admitting: Acute Care

## 2016-01-25 ENCOUNTER — Telehealth: Payer: Self-pay | Admitting: Acute Care

## 2016-01-25 ENCOUNTER — Encounter: Payer: Self-pay | Admitting: Acute Care

## 2016-01-25 ENCOUNTER — Ambulatory Visit (INDEPENDENT_AMBULATORY_CARE_PROVIDER_SITE_OTHER): Payer: Medicare Other | Admitting: Acute Care

## 2016-01-25 ENCOUNTER — Other Ambulatory Visit (INDEPENDENT_AMBULATORY_CARE_PROVIDER_SITE_OTHER): Payer: Medicare Other

## 2016-01-25 VITALS — BP 114/72 | HR 73 | Ht 62.0 in | Wt 155.2 lb

## 2016-01-25 DIAGNOSIS — I2602 Saddle embolus of pulmonary artery with acute cor pulmonale: Secondary | ICD-10-CM

## 2016-01-25 DIAGNOSIS — I214 Non-ST elevation (NSTEMI) myocardial infarction: Secondary | ICD-10-CM | POA: Diagnosis not present

## 2016-01-25 LAB — CBC WITH DIFFERENTIAL/PLATELET
BASOS PCT: 1.3 % (ref 0.0–3.0)
Basophils Absolute: 0.1 10*3/uL (ref 0.0–0.1)
EOS PCT: 4.2 % (ref 0.0–5.0)
Eosinophils Absolute: 0.2 10*3/uL (ref 0.0–0.7)
HEMATOCRIT: 38.1 % (ref 36.0–46.0)
HEMOGLOBIN: 12.1 g/dL (ref 12.0–15.0)
LYMPHS PCT: 34 % (ref 12.0–46.0)
Lymphs Abs: 1.6 10*3/uL (ref 0.7–4.0)
MCHC: 31.8 g/dL (ref 30.0–36.0)
MCV: 87.1 fl (ref 78.0–100.0)
MONOS PCT: 8.4 % (ref 3.0–12.0)
Monocytes Absolute: 0.4 10*3/uL (ref 0.1–1.0)
Neutro Abs: 2.5 10*3/uL (ref 1.4–7.7)
Neutrophils Relative %: 52.1 % (ref 43.0–77.0)
Platelets: 534 10*3/uL — ABNORMAL HIGH (ref 150.0–400.0)
RBC: 4.38 Mil/uL (ref 3.87–5.11)
RDW: 14.9 % (ref 11.5–15.5)
WBC: 4.7 10*3/uL (ref 4.0–10.5)

## 2016-01-25 MED ORDER — APIXABAN 5 MG PO TABS: 5.0000 mg | ORAL_TABLET | Freq: Two times a day (BID) | ORAL | 5 refills | Status: DC

## 2016-01-25 NOTE — Progress Notes (Signed)
Reviewed and agree with assessment/plan.  Coralyn HellingVineet Jerian Morais, MD [facility] 01/25/2016, 7:11 PM Pager:  [phone]

## 2016-01-25 NOTE — Patient Instructions (Addendum)
It is nice to meet you today. We will check a CBC today in the lab. CXR today Add nasal saline 3-4 times daily for nose dryness We will send additional prescriptions for your Eliquis Continue Eliquis  5 mg twice daily without fail. We will check a 2 D Echo in 3 months We will refer you to cardiology for follow up of your cardiac events while you were hosptitalized. Follow up with Dr. Craige CottaSood or NP in 3 months after Echo completed. Please follow-up with your primary care physician Dr. Murtis Sinkobin Saunders on 01/27/2016 as his schedule. Please contact office for sooner follow up if symptoms do not improve or worsen or seek emergency care  .

## 2016-01-25 NOTE — Assessment & Plan Note (Addendum)
Clinical improvement after PE with right heart strain Compliance with Eliquis Plan: CBC today CXR today Add nasal saline 3-4 times daily for nose dryness We will send additional prescriptions for your Eliquis Continue Eliquis  5 mg twice daily without fail. We will check a 2 D Echo in 3 months We will refer you to cardiology for follow up of your cardiac events while you were hosptitalized. Follow up with Dr. Craige CottaSood or NP in 3 months after Echo completed. Please follow-up with your primary care physician Dr. Murtis Sinkobin Saunders on 01/27/2016 as is scheduled. Please contact office for sooner follow up if symptoms do not improve or worsen or seek emergency care

## 2016-01-25 NOTE — Progress Notes (Signed)
History of Present Illness Glenda Blankenship is a 75 y.o. female with recent hospitalization for Sepsis, DVT with PE and positive RV strain ( Ratio 1:1). She was seen by Dr. Craige Cotta as an inpatient.   01/25/2016 Hospital Follow Up: Patient presents to the office for follow-up of recent hospitalization for sepsis, DVT, PE with positive RV strain, and non-STEMI. Other significant medical problems this admission included bilateral pleural effusions, pericardial effusion, acute renal failure, and metabolic acidosis. During hospitalization the patient was made a DO NOT RESUSCITATE. She has recovered well. She presents today stating that her breathing is better. She is on room air with saturations of 96%.. Chest x-ray today revealed near complete resolution of right pleural effusion with enlarged cardiac silhouette. She does take Lasix per her primary care physician Dr. Murtis Sink. She has an appointment with Dr. Lelon Perla on 11/29 for follow-up of her medical issues. Today she denies any shortness of breath. She states that time she does still have a cough that is nonproductive. He states she is compliant every day with her Eliquis. She has had no bleeding.  When discussing the dosage regarding her Eliquis, we discovered that she was not given the prescription for the 10 mg twice a day dosing for the initial week by her pharmacy. The prescription was sent to the patient's [facility] pharmacy correctly, per the electronic medical record documentation. Specifically there was a prescription for a 10 mg twice a day dosing for 7 days to be followed by 5 mg twice a day. We called the pharmacy today to ask why the patient was not given both prescriptions, and they did not have an answer stating that they would need to look into what happened. They acknowledged during this phone call that they had received both prescriptions. We reviewed bleeding precautions while on blood thinners. Additionally they did not provide her with  her Eliquis on her day of discharge, she had to return to the pharmacy the next day and was then able to initiate her treatment. She is currently having some streaks of blood when she blows her nose, we will try nasal saline to see if that resolves. Patient denies chest pain, fever, orthopnea, or hemoptysis. We have sent prescriptions to ensure the patient has adequate prescriptions for a full 6 months of treatment. Treatment duration is yet to be determined. The patient has not been seen by cardiology, and had not made an appointment for follow-up post non-STEMI. We will refer to cardiology to ensure she gets follow-up.  Of note, the patient is not checking her blood sugars as she was instructed to at discharge. I have encouraged her to check her blood sugars as she was instructed to do, and to follow-up with her primary care physician regarding this on 01/27/2016.   Significant events:   Admit date: 01/07/2016 Discharge date: 01/14/2016  Additional Studies: CT abd/pelvis 11/10 >> small b/l effusions, atherosclerosis Echo 11/13 >> EF 65 to 70%, grade 1 DD, mod RV dilation, severe TR, PAS 52 mmHg, mild/mod pericardial effusion Lt thoracentesis 11/13 >> glucose 155, protein < 3, LDH 138, WBC 758 (50L, 46M, 11E) CTA Chest >> acute saddle PE with evidence of RV strain (ratio 1:1), small pleural effusions, partially loculated on R, small pericardial effusion B/l LE Duplex 11/15 >> DVT Rt popliteal, posterior tibial veins  Cultures: GI panel 11/10 >> negative C diff PCR 11/10 >> negative Urine 11/10 >> negative Blood 11/10 >> negative Lt pleural fluid 11/13 >>  Antibiotics: Ceftriaxone  11/10 >>11/16 Vancomycin 11/10 >> 11/10  Significant events: 11/10 Admit 11/12 To telemetry 11/14 Attempted R thoracentesis. Start heparin gtt for PE 11/16 change to eliquis, discharge home   Chest x-ray 01/25/2016 IMPRESSION: Near complete resolution of RIGHT pleural effusion post thoracentesis  without evidence of pneumothorax.  RIGHT lung scarring.  Aortic atherosclerosis.  Enlargement of cardiac silhouette.    Past medical hx Past Medical History:  Diagnosis Date  . Diabetes [facility](HCC)      Past surgical hx, Family hx, Social hx all reviewed.  Current Outpatient Prescriptions on File Prior to Visit  Medication Sig  . allopurinol (ZYLOPRIM) 300 MG tablet Take 1 tablet by mouth daily.  Marland Kitchen. aspirin 81 MG chewable tablet Chew 81 mg by mouth daily.  . metoprolol tartrate (LOPRESSOR) 25 MG tablet Take 1 tablet (25 mg total) by mouth 2 (two) times daily.   No current facility-administered medications on file prior to visit.      No Known Allergies  Review Of Systems:  Constitutional:   No  weight loss, night sweats,  Fevers, chills, fatigue, or  lassitude.  HEENT:   No headaches,  Difficulty swallowing,  Tooth/dental problems, or  Sore throat,                No sneezing, itching, ear ache, nasal congestion, +post nasal drip,   CV:  No chest pain,  Orthopnea, PND, swelling in lower extremities, anasarca, dizziness, palpitations, syncope.   GI  No heartburn, indigestion, abdominal pain, nausea, vomiting, diarrhea, change in bowel habits, loss of appetite, bloody stools.   Resp: No shortness of breath with exertion or at rest.  No excess mucus, no productive cough,  Rare + non-productive cough,  No coughing up of blood.  No change in color of mucus.  No wheezing.  No chest wall deformity  Skin: no rash or lesions.  GU: no dysuria, change in color of urine, no urgency or frequency.  No flank pain, no hematuria   MS:  No joint pain or swelling.  No decreased range of motion.  No back pain.  Psych:  No change in mood or affect. No depression or anxiety.  No memory loss.   Vital Signs BP 114/72 (BP Location: Right Arm, Patient Position: Sitting, Cuff Size: Normal)   Pulse 73   Ht 5\' 2"  (1.575 m)   Wt 155 lb 3.2 oz (70.4 kg)   SpO2 96%   BMI 28.39 kg/m     Physical Exam:  General- No distress,  A&Ox3, pleasant thin elderly female ENT: No sinus tenderness, TM clear, pale nasal mucosa, no oral exudate,no post nasal drip, no LAN Cardiac: S1, S2, regular rate and rhythm, no murmur Chest: No wheeze/ rales/ dullness; no accessory muscle use, no nasal flaring, no sternal retractions Abd.: Soft Non-tender Ext: No clubbing cyanosis, edema Neuro:  normal strength, deconditioned at baseline Skin: No rashes, warm and dry Psych: normal mood and behavior   Assessment/Plan  Acute saddle pulmonary embolism with acute cor pulmonale (HCC) Clinical improvement after PE with right heart strain Compliance with Eliquis Plan: CBC today CXR today Add nasal saline 3-4 times daily for nose dryness We will send additional prescriptions for your Eliquis Continue Eliquis  5 mg twice daily without fail. We will check a 2 D Echo in 3 months We will refer you to cardiology for follow up of your cardiac events while you were hosptitalized. Follow up with Dr. Craige CottaSood or NP in 3 months after Echo completed. Please follow-up  with your primary care physician Dr. Murtis Sinkobin Saunders on 01/27/2016 as is scheduled. Please contact office for sooner follow up if symptoms do not improve or worsen or seek emergency care      Bevelyn NgoSarah F Daemian Gahm, NP 01/25/2016  7:05 PM

## 2016-01-25 NOTE — Telephone Encounter (Signed)
I have called Mrs. Glenda Blankenship with the results of her chest x-ray and her CBC. I explained to her that her chest x-ray showed an improving right lung with decreased fluid noted, and no acute process or pneumonia. I did explain to her that there was an enlarged cardiac silhouette. CBC indicated a hemoglobin of 12.1, but a platelet count of 534,000. In light of the fact that she is currently taking Lasix, I asked her if she felt that she was dehydrated. She stated that she did not think so. She is scheduled to see her primary care physician on 01/27/2016. I will fax a copy of the chest x-ray and the CBC results to her primary care physician. She verbalized understanding of the above and had no further questions at completion of the call. She has a follow-up appointment with Tammy. Nurse practitioner on 04/29/2016.

## 2016-01-26 ENCOUNTER — Ambulatory Visit (INDEPENDENT_AMBULATORY_CARE_PROVIDER_SITE_OTHER): Payer: Medicare Other | Admitting: Cardiology

## 2016-01-26 ENCOUNTER — Encounter: Payer: Self-pay | Admitting: Cardiology

## 2016-01-26 VITALS — BP 124/82 | HR 78 | Ht 62.0 in | Wt 159.2 lb

## 2016-01-26 DIAGNOSIS — I214 Non-ST elevation (NSTEMI) myocardial infarction: Secondary | ICD-10-CM | POA: Diagnosis not present

## 2016-01-26 DIAGNOSIS — I272 Pulmonary hypertension, unspecified: Secondary | ICD-10-CM | POA: Diagnosis not present

## 2016-01-26 DIAGNOSIS — I2602 Saddle embolus of pulmonary artery with acute cor pulmonale: Secondary | ICD-10-CM

## 2016-01-26 DIAGNOSIS — I21A1 Myocardial infarction type 2: Secondary | ICD-10-CM

## 2016-01-26 DIAGNOSIS — I82401 Acute embolism and thrombosis of unspecified deep veins of right lower extremity: Secondary | ICD-10-CM

## 2016-01-26 NOTE — Patient Instructions (Signed)
Medication Instructions:  The current medical regimen is effective;  continue present plan and medications.  Testing/Procedures: Your physician has requested that you have a lexiscan myoview in 3months. (On the same day as your echo) For further information please visit [email or web address]. Please follow instruction sheet, as given.  Follow-Up: Follow up with Dr Anne FuSkains after your testing in 3 months.  If you need a refill on your cardiac medications before your next appointment, please call your pharmacy.  Thank you for choosing [facility]!!

## 2016-01-26 NOTE — Progress Notes (Signed)
Cardiology Office Note    Date:  01/26/2016   ID:  Glenda Blankenship, Glenda Blankenship 02-22-1941, MRN [id number]  PCP:  Gwynneth Aliment, MD  Cardiologist:   Donato Schultz, MD     History of Present Illness:  Glenda Blankenship is a 75 y.o. female here for evaluation of non-ST elevation myocardial infarction in the setting of septic/hypovolemic shock, pulmonary embolism. Currently on Eliquis. Troponin was 2.1.  Echocardiogram showed pulmonary artery pressures of 52 mmHg, moderate RV dilatation and ejection fraction of 70%. She had right popliteal DVT. CT of chest showed acute saddle PE with strain.  She is not having any further significant shortness of breath. This is improving. Her lower extremity edema right leg is improving. She denies any chest pain. No anginal like symptoms. No prior cardiovascular events. Her father had "blood clots". She's not having any bleeding with Eliquis.  She was a smoker, has quit. Excellent.  Past Medical History:  Diagnosis Date  . Diabetes (HCC)     No past surgical history on file.  Current Medications: Outpatient Medications Prior to Visit  Medication Sig Dispense Refill  . apixaban (ELIQUIS) 5 MG TABS tablet Take 1 tablet (5 mg total) by mouth 2 (two) times daily. 60 tablet 5  . aspirin 81 MG chewable tablet Chew 81 mg by mouth daily.    . metoprolol tartrate (LOPRESSOR) 25 MG tablet Take 1 tablet (25 mg total) by mouth 2 (two) times daily. 60 tablet 1  . allopurinol (ZYLOPRIM) 300 MG tablet Take 1 tablet by mouth daily.    Marland Kitchen lisinopril-hydrochlorothiazide (PRINZIDE,ZESTORETIC) 20-25 MG tablet      No facility-administered medications prior to visit.      Allergies:   Patient has no known allergies.   Social History   Social History  . Marital status: Divorced    Spouse name: N/A  . Number of children: N/A  . Years of education: N/A   Social History Main Topics  . Smoking status: Former Games developer  . Smokeless tobacco: Never Used  . Alcohol use No  .  Drug use: No  . Sexual activity: Not Asked   Other Topics Concern  . None   Social History Narrative  . None     Family History:  No early family history of coronary artery disease   ROS:   Please see the history of present illness.    ROS All other systems reviewed and are negative.   PHYSICAL EXAM:   VS:  BP 124/82 (BP Location: Left Arm, Cuff Size: Normal)   Pulse 78   Ht 5\' 2"  (1.575 m)   Wt 159 lb 3.2 oz (72.2 kg)   LMP  (LMP Unknown)   BMI 29.12 kg/m    GEN: Well nourished, well developed, in no acute distress  HEENT: normal  Neck: no JVD, carotid bruits, or masses Cardiac: RRR; no murmurs, rubs, or gallops,no edema  Respiratory:  clear to auscultation bilaterally, normal work of breathing GI: soft, nontender, nondistended, + BS MS: no deformity or atrophy  Skin: warm and dry, no rash Neuro:  Alert and Oriented x 3, Strength and sensation are intact Psych: euthymic mood, full affect  Wt Readings from Last 3 Encounters:  01/26/16 159 lb 3.2 oz (72.2 kg)  01/25/16 155 lb 3.2 oz (70.4 kg)  01/14/16 163 lb 4.8 oz (74.1 kg)      Studies/Labs Reviewed:   EKG:  EKG is not ordered today. 01/07/16-sinus rhythm, nonspecific ST-T wave changes, T-wave inversion  V2 noted  Recent Labs: 01/07/2016: ALT 26 01/11/2016: Magnesium 1.9 01/14/2016: BUN 13; Creatinine, Ser 0.90; Potassium 3.2; Sodium 138 01/25/2016: Hemoglobin 12.1; Platelets 534.0   Lipid Panel No results found for: CHOL, TRIG, HDL, CHOLHDL, VLDL, LDLCALC, LDLDIRECT  Additional studies/ records that were reviewed today include:  Prior echocardiogram, hospital notes, lab work reviewed  ECHO 01/11/16: - Left ventricle: The cavity size was normal. Systolic function was   vigorous. The estimated ejection fraction was in the range of 65%   to 70%. Wall motion was normal; there were no regional wall   motion abnormalities. Doppler parameters are consistent with   abnormal left ventricular relaxation (grade 1  diastolic   dysfunction). There was no evidence of elevated ventricular   filling pressure by Doppler parameters. - Ventricular septum: The contour showed diastolic flattening and   systolic flattening consistent pressure and volume overload. . - Aortic valve: Trileaflet; normal thickness leaflets. There was no   regurgitation. - Mitral valve: There was no regurgitation. - Right ventricle: The cavity size was moderately dilated. Wall   thickness was normal. Systolic function was mildly reduced. - Tricuspid valve: There was severe regurgitation. - Pulmonary arteries: Systolic pressure was severely increased. PA   peak pressure: 52 mm Hg (S). - Pericardium, extracardiac: A mild to moderate pericardial   effusion was identified. Features were not consistent with   tamponade physiology. There was a left pleural effusion.  Impressions:  - Hyperdynamic LVEF, impaired relaxation with normal filling   pressures.   Moderately dilated RV with mildly to moderately decreased   systolic function with severe pulmonary hypertension.   These findings are suspicious for underlying pulmonary problem.     There is mild to moderate circumferential pericardial effusion   with no signs of tamponade.    ASSESSMENT:    1. Non-ST elevation myocardial infarction (NSTEMI), type 2 (HCC)   2. Acute saddle pulmonary embolism with acute cor pulmonale (HCC)   3. Acute deep vein thrombosis (DVT) of right lower extremity, unspecified vein (HCC)   4. Pulmonary hypertension      PLAN:  In order of problems listed above:  Non-ST elevation myocardial infarction-type 2, demand ischemia in the setting of saddle pulmonary embolus and right ventricular strain  - In 3 months, I would like to check a pharmacologic stress test to ensure that she does not have any high risk ischemia or underlying CAD. She is also getting echocardiogram at that time to reevaluate her right ventricle and pulmonary hypertension.  -  The most likely etiology for her troponin to his demand ischemia in the setting of significant pulmonary embolism.  - Personally reviewed CT scan-I do not see any obvious coronary artery calcification. There is mild thoracic aortic atherosclerosis.  Saddle pulmonary embolus, unprovoked  - Currently on Eliquis for 6 months  - Her father had blood clots she states.  - Question possible familial component.  - Her edema in right lower extremity is improved.  DVT right lower extremity  - Eliquis, improved.  Pulmonary hypertension  - Secondary from PE. Hopefully this will improve with treatment.  Medication Adjustments/Labs and Tests Ordered: Current medicines are reviewed at length with the patient today.  Concerns regarding medicines are outlined above.  Medication changes, Labs and Tests ordered today are listed in the Patient Instructions below. Patient Instructions  Medication Instructions:  The current medical regimen is effective;  continue present plan and medications.  Testing/Procedures: Your physician has requested that you have a lexiscan myoview  in 3months. (On the same day as your echo) For further information please visit [email or web address]. Please follow instruction sheet, as given.  Follow-Up: Follow up with Dr Anne FuSkains after your testing in 3 months.  If you need a refill on your cardiac medications before your next appointment, please call your pharmacy.  Thank you for choosing [facility]!!        Signed, Donato SchultzMark Mechell Girgis, MD  01/26/2016 10:54 AM    [facility] [address], [place], [place]  [address] Phone: [phone]([phone]; Fax: [phone]

## 2016-04-25 ENCOUNTER — Telehealth (HOSPITAL_COMMUNITY): Payer: Self-pay | Admitting: *Deleted

## 2016-04-25 NOTE — Telephone Encounter (Signed)
Attempted to call patient regarding upcoming nuclear stress test, no answer and unable to leave message.  Glenda Blankenship, [name]

## 2016-04-27 ENCOUNTER — Other Ambulatory Visit (HOSPITAL_COMMUNITY): Payer: Medicare Other

## 2016-04-27 ENCOUNTER — Ambulatory Visit (HOSPITAL_BASED_OUTPATIENT_CLINIC_OR_DEPARTMENT_OTHER): Payer: Medicare Other

## 2016-04-27 ENCOUNTER — Ambulatory Visit (HOSPITAL_COMMUNITY): Payer: Medicare Other | Attending: Cardiovascular Disease

## 2016-04-27 ENCOUNTER — Other Ambulatory Visit: Payer: Self-pay

## 2016-04-27 DIAGNOSIS — I517 Cardiomegaly: Secondary | ICD-10-CM | POA: Insufficient documentation

## 2016-04-27 DIAGNOSIS — I214 Non-ST elevation (NSTEMI) myocardial infarction: Secondary | ICD-10-CM | POA: Diagnosis not present

## 2016-04-27 DIAGNOSIS — I21A1 Myocardial infarction type 2: Secondary | ICD-10-CM

## 2016-04-27 DIAGNOSIS — I2602 Saddle embolus of pulmonary artery with acute cor pulmonale: Secondary | ICD-10-CM

## 2016-04-27 LAB — MYOCARDIAL PERFUSION IMAGING
CHL CUP NUCLEAR SDS: 2
CHL CUP RESTING HR STRESS: 64 {beats}/min
CSEPPHR: 102 {beats}/min
LV dias vol: 60 mL (ref 46–106)
LV sys vol: 16 mL
RATE: 0.28
SRS: 2
SSS: 4
TID: 0.85

## 2016-04-27 MED ORDER — REGADENOSON 0.4 MG/5ML IV SOLN
0.4000 mg | Freq: Once | INTRAVENOUS | Status: AC
  Administered 2016-04-27: 0.4 mg via INTRAVENOUS

## 2016-04-27 MED ORDER — TECHNETIUM TC 99M TETROFOSMIN IV KIT
10.4000 | PACK | Freq: Once | INTRAVENOUS | Status: AC | PRN
  Administered 2016-04-27: 10.4 via INTRAVENOUS
  Filled 2016-04-27: qty 11

## 2016-04-27 MED ORDER — TECHNETIUM TC 99M TETROFOSMIN IV KIT
32.1000 | PACK | Freq: Once | INTRAVENOUS | Status: AC | PRN
  Administered 2016-04-27: 32.1 via INTRAVENOUS
  Filled 2016-04-27: qty 33

## 2016-04-28 ENCOUNTER — Encounter: Payer: Self-pay | Admitting: Cardiology

## 2016-04-28 ENCOUNTER — Ambulatory Visit (INDEPENDENT_AMBULATORY_CARE_PROVIDER_SITE_OTHER): Payer: Medicare Other | Admitting: Cardiology

## 2016-04-28 ENCOUNTER — Encounter (INDEPENDENT_AMBULATORY_CARE_PROVIDER_SITE_OTHER): Payer: Self-pay

## 2016-04-28 VITALS — BP 112/62 | HR 91 | Ht 62.0 in | Wt 154.0 lb

## 2016-04-28 DIAGNOSIS — I82401 Acute embolism and thrombosis of unspecified deep veins of right lower extremity: Secondary | ICD-10-CM

## 2016-04-28 DIAGNOSIS — I2602 Saddle embolus of pulmonary artery with acute cor pulmonale: Secondary | ICD-10-CM | POA: Diagnosis not present

## 2016-04-28 DIAGNOSIS — I214 Non-ST elevation (NSTEMI) myocardial infarction: Secondary | ICD-10-CM

## 2016-04-28 DIAGNOSIS — I21A1 Myocardial infarction type 2: Secondary | ICD-10-CM

## 2016-04-28 NOTE — Patient Instructions (Signed)
Medication Instructions:  The current medical regimen is effective;  continue present plan and medications.  Follow-Up: Follow up as needed with Dr Skains.  Thank you for choosing [facility]!!     

## 2016-04-28 NOTE — Progress Notes (Signed)
Cardiology Office Note    Date:  04/28/2016   ID:  Glenda Blankenship, Glenda Blankenship Sep 12, 1940, MRN [id number]  PCP:  Gwynneth Aliment, MD  Cardiologist:   Donato Schultz, MD     History of Present Illness:  Glenda Blankenship is a 76 y.o. female here for Follow-up of non-ST elevation myocardial infarction in the setting of septic/hypovolemic shock, pulmonary embolism. Currently on Eliquis. Troponin was 2.1.  Echocardiogram showed pulmonary artery pressures of 52 mmHg, moderate RV dilatation and ejection fraction of 70%. She had right popliteal DVT. CT of chest showed acute saddle PE with strain.  We waited 3 months and then performed a nuclear stress test on 04/27/16 which showed no ischemia, normal ejection fraction. Excellent results.  She's not having any chest pain, no shortness of breath.   Her father had "blood clots". She's not having any bleeding with Eliquis.  She was a smoker, has quit. Excellent.  Past Medical History:  Diagnosis Date  . Diabetes (HCC)     No past surgical history on file.  Current Medications: Outpatient Medications Prior to Visit  Medication Sig Dispense Refill  . apixaban (ELIQUIS) 5 MG TABS tablet Take 1 tablet (5 mg total) by mouth 2 (two) times daily. 60 tablet 5  . aspirin 81 MG chewable tablet Chew 81 mg by mouth daily.    . metoprolol tartrate (LOPRESSOR) 25 MG tablet Take 1 tablet (25 mg total) by mouth 2 (two) times daily. 60 tablet 1   No facility-administered medications prior to visit.      Allergies:   Patient has no known allergies.   Social History   Social History  . Marital status: Divorced    Spouse name: N/A  . Number of children: N/A  . Years of education: N/A   Social History Main Topics  . Smoking status: Former Games developer  . Smokeless tobacco: Never Used  . Alcohol use No  . Drug use: No  . Sexual activity: Not Asked   Other Topics Concern  . None   Social History Narrative  . None     Family History:  No early family  history of coronary artery disease   ROS:   Please see the history of present illness.    ROS All other systems reviewed and are negative.   PHYSICAL EXAM:   VS:  BP 112/62   Pulse 91   Ht 5\' 2"  (1.575 m)   Wt 154 lb (69.9 kg)   LMP  (LMP Unknown)   SpO2 98%   BMI 28.17 kg/m    GEN: Well nourished, well developed, in no acute distress  HEENT: normal  Neck: no JVD, carotid bruits, or masses Cardiac: RRR; no murmurs, rubs, or gallops,no edema  Respiratory:  clear to auscultation bilaterally, normal work of breathing GI: soft, nontender, nondistended, + BS MS: no deformity or atrophy  Skin: warm and dry, no rash Neuro:  Alert and Oriented x 3, Strength and sensation are intact Psych: euthymic mood, full affect  Wt Readings from Last 3 Encounters:  04/28/16 154 lb (69.9 kg)  04/27/16 159 lb (72.1 kg)  01/26/16 159 lb 3.2 oz (72.2 kg)      Studies/Labs Reviewed:   EKG:  EKG is not ordered today. 01/07/16-sinus rhythm, nonspecific ST-T wave changes, T-wave inversion V2 noted  Recent Labs: 01/07/2016: ALT 26 01/11/2016: Magnesium 1.9 01/14/2016: BUN 13; Creatinine, Ser 0.90; Potassium 3.2; Sodium 138 01/25/2016: Hemoglobin 12.1; Platelets 534.0   Lipid Panel No results  found for: CHOL, TRIG, HDL, CHOLHDL, VLDL, LDLCALC, LDLDIRECT  Additional studies/ records that were reviewed today include:  Prior echocardiogram, hospital notes, lab work reviewed  ECHO 01/11/16: - Left ventricle: The cavity size was normal. Systolic function was   vigorous. The estimated ejection fraction was in the range of 65%   to 70%. Wall motion was normal; there were no regional wall   motion abnormalities. Doppler parameters are consistent with   abnormal left ventricular relaxation (grade 1 diastolic   dysfunction). There was no evidence of elevated ventricular   filling pressure by Doppler parameters. - Ventricular septum: The contour showed diastolic flattening and   systolic flattening  consistent pressure and volume overload. . - Aortic valve: Trileaflet; normal thickness leaflets. There was no   regurgitation. - Mitral valve: There was no regurgitation. - Right ventricle: The cavity size was moderately dilated. Wall   thickness was normal. Systolic function was mildly reduced. - Tricuspid valve: There was severe regurgitation. - Pulmonary arteries: Systolic pressure was severely increased. PA   peak pressure: 52 mm Hg (S). - Pericardium, extracardiac: A mild to moderate pericardial   effusion was identified. Features were not consistent with   tamponade physiology. There was a left pleural effusion.  Impressions:  - Hyperdynamic LVEF, impaired relaxation with normal filling   pressures.   Moderately dilated RV with mildly to moderately decreased   systolic function with severe pulmonary hypertension.   These findings are suspicious for underlying pulmonary problem.     There is mild to moderate circumferential pericardial effusion   with no signs of tamponade.  Repeat echocardiogram 04/27/16: - Left ventricle: The cavity size was normal. Wall thickness was   increased in a pattern of mild LVH. Systolic function was normal.   The estimated ejection fraction was in the range of 60% to 65%.   Wall motion was normal; there were no regional wall motion   abnormalities. Left ventricular diastolic function parameters   were normal. - Pulmonary arteries: Systolic pressure was mildly increased. PA   peak pressure: 35 mm Hg (S).  ASSESSMENT:    1. Acute saddle pulmonary embolism with acute cor pulmonale (HCC)   2. Non-ST elevation myocardial infarction (NSTEMI), type 2 (HCC)   3. Acute deep vein thrombosis (DVT) of right lower extremity, unspecified vein (HCC)      PLAN:  In order of problems listed above:  Non-ST elevation myocardial infarction-type 2, demand ischemia in the setting of saddle pulmonary embolus and right ventricular strain  - Pharmacologic  stress test Was reassuring, no ischemia, normal ejection fraction. She is also getting echocardiogram at that time to reevaluate her right ventricle and pulmonary hypertension.  - The most likely etiology for her troponin to his demand ischemia in the setting of significant pulmonary embolism.  - Personally reviewed CT scan-I do not see any obvious coronary artery calcification. There is mild thoracic aortic atherosclerosis.  - - No reversible ischemia. LVEF 73% with normal wall motion. This is a low risk study.  - Thankfully her repeat echocardiogram does not show any evidence of significant right ventricular strain any longer. Her pulmonary pressures have decreased to 35 mmHg.  Saddle pulmonary embolus, unprovoked  - Currently on Eliquis for 6 months  - Her father had blood clots she states.  - Question possible familial component.  - Her edema in right lower extremity is improved.  DVT right lower extremity  - Eliquis, improved.  Pulmonary hypertension  - Secondary from PE. Hopefully this  will improve with treatment.  We will see her back as needed.  Medication Adjustments/Labs and Tests Ordered: Current medicines are reviewed at length with the patient today.  Concerns regarding medicines are outlined above.  Medication changes, Labs and Tests ordered today are listed in the Patient Instructions below. Patient Instructions  Medication Instructions:  The current medical regimen is effective;  continue present plan and medications.  Follow-Up: Follow up as needed with Dr Anne FuSkains.  Thank you for choosing [facility]!!        Signed, Donato SchultzMark Skains, MD  04/28/2016 9:53 AM    [facility] [address], [place], [place]  [address] Phone: [phone](336) [phone]; Fax: [phone](336) [phone]

## 2016-04-29 ENCOUNTER — Ambulatory Visit (INDEPENDENT_AMBULATORY_CARE_PROVIDER_SITE_OTHER): Payer: Medicare Other | Admitting: Adult Health

## 2016-04-29 ENCOUNTER — Encounter: Payer: Self-pay | Admitting: Adult Health

## 2016-04-29 DIAGNOSIS — I2602 Saddle embolus of pulmonary artery with acute cor pulmonale: Secondary | ICD-10-CM

## 2016-04-29 DIAGNOSIS — I82401 Acute embolism and thrombosis of unspecified deep veins of right lower extremity: Secondary | ICD-10-CM

## 2016-04-29 MED ORDER — APIXABAN 5 MG PO TABS: 5.0000 mg | ORAL_TABLET | Freq: Two times a day (BID) | ORAL | 5 refills | Status: DC

## 2016-04-29 NOTE — Addendum Note (Signed)
Addended by: Boone MasterJONES, Tywan Siever E on: 04/29/2016 12:58 PM   Modules accepted: Orders

## 2016-04-29 NOTE — Progress Notes (Signed)
@Patient  ID: Glenda Blankenship, female    DOB: 01-18-1941, 76 y.o.   MRN: [id number]  Chief Complaint  Patient presents with  . Follow-up    PE     Referring provider: Dorothyann Peng, MD  HPI: 76 year old female seen for pulmonary consult during hospitalization. November 2017 with acute PE, DVT in the setting of sepsis  TEST  CT abd/pelvis 11/10 >> small b/l effusions, atherosclerosis Echo 11/13 >> EF 65 to 70%, grade 1 DD, mod RV dilation, severe TR, PAS 52 mmHg, mild/mod pericardial effusion Lt thoracentesis 11/13 >> glucose 155, protein < 3, LDH 138, WBC 758 (50L, 97M, 11E) CTA Chest >> acute saddle PE with evidence of RV strain (ratio 1:1), small pleural effusions, partially loculated on R, small pericardial effusion B/l LE Duplex 11/15 >> DVT Rt popliteal, posterior tibial veins   04/29/2016 Follow up PE  Pt  returns for three-month follow-up. Patient was seen last visit after a hospitalization in November 2017 for an acute PE/ -unprovoked. With Right heart strain and Right  DVT. Patient was treated with heparin and transitioned to Elquis. Echo in November 2017 showed moderately dilated right ventricle elevated pulmonary artery pressure at 52 mmHg and severe tricuspid valve regurgitation. Repeat echo done on February 28 showed normal left ventricular function with an EF of 60-65%, right ventricle and right atrium. Normal tricuspid valve with only mild regurgitation. Minimally elevated pulmonary artery pressure at 35 mmHg.Marland Kitchen She is feeling good, no dyspnea, cough or chest pain .   Patient is followed by cardiology and had a myocardial perfusion test considered a low risk study.on 04/27/16 .   No Known Allergies  Immunization History  Administered Date(s) Administered  . Influenza-Unspecified 12/29/2012, 12/07/2015    Past Medical History:  Diagnosis Date  . Diabetes (HCC)     Tobacco History: History  Smoking Status  . Former Smoker  Smokeless Tobacco  . Never Used    Counseling given: Not Answered   Outpatient Encounter Prescriptions as of 04/29/2016  Medication Sig  . apixaban (ELIQUIS) 5 MG TABS tablet Take 1 tablet (5 mg total) by mouth 2 (two) times daily.  Marland Kitchen aspirin 81 MG chewable tablet Chew 81 mg by mouth daily.  . metoprolol tartrate (LOPRESSOR) 25 MG tablet Take 1 tablet (25 mg total) by mouth 2 (two) times daily.   No facility-administered encounter medications on file as of 04/29/2016.      Review of Systems  Constitutional:   No  weight loss, night sweats,  Fevers, chills, fatigue, or  lassitude.  HEENT:   No headaches,  Difficulty swallowing,  Tooth/dental problems, or  Sore throat,                No sneezing, itching, ear ache, nasal congestion, post nasal drip,   CV:  No chest pain,  Orthopnea, PND, swelling in lower extremities, anasarca, dizziness, palpitations, syncope.   GI  No heartburn, indigestion, abdominal pain, nausea, vomiting, diarrhea, change in bowel habits, loss of appetite, bloody stools.   Resp: No shortness of breath with exertion or at rest.  No excess mucus, no productive cough,  No non-productive cough,  No coughing up of blood.  No change in color of mucus.  No wheezing.  No chest wall deformity  Skin: no rash or lesions.  GU: no dysuria, change in color of urine, no urgency or frequency.  No flank pain, no hematuria   MS:  No joint pain or swelling.  No decreased range of motion.  No back pain.    Physical Exam  BP 122/70 (BP Location: Right Arm, Cuff Size: Normal)   Pulse 64   Ht 5\' 2"  (1.575 m)   Wt 155 lb 6.4 oz (70.5 kg)   LMP  (LMP Unknown)   SpO2 98%   BMI 28.42 kg/m   GEN: A/Ox3; pleasant , NAD, elderly    HEENT:  [place]/AT,  EACs-clear, TMs-wnl, NOSE-clear, THROAT-clear, no lesions, no postnasal drip or exudate noted.   NECK:  Supple w/ fair ROM; no JVD; normal carotid impulses w/o bruits; no thyromegaly or nodules palpated; no lymphadenopathy.    RESP  Clear  P & A; w/o, wheezes/ rales/  or rhonchi. no accessory muscle use, no dullness to percussion  CARD:  RRR, no m/r/g, no  peripheral edema, pulses intact, no cyanosis or clubbing.  GI:   Soft & nt; nml bowel sounds; no organomegaly or masses detected.   Musco: Warm bil, no deformities or joint swelling noted.   Neuro: alert, no focal deficits noted.    Skin: Warm, no lesions or rashes    Lab Results:  CBC  No results found for: BNP  ProBNP No results found for: PROBNP  Imaging: No results found.   Assessment & Plan:   Acute saddle pulmonary embolism with acute cor pulmonale (HCC) PE (unprovoked 12/2016 , first time clot) plan for at least 6 months of Eliquis (complete in May )  Will need follow up ven doppler for R DVT  Then can decide on further tx options, referral to hematology , and Hypercoagulble panel , etc Repeat echo is improved w/ return to norma RV /TV . PAP decreased   Plan  Patient Instructions  Set up Venous Doppler in Early May .  Continue on Eliquis 5mg  Twice daily  .  follow up Dr. Craige CottaSood  In 3 months and As needed   Please contact office for sooner follow up if symptoms do not improve or worsen or seek emergency care        Deep venous thrombosis (HCC) Ven doppler repeat in May 2018      Rubye Oaksammy Celina Shiley, NP 04/29/2016

## 2016-04-29 NOTE — Assessment & Plan Note (Signed)
Ven doppler repeat in May 2018

## 2016-04-29 NOTE — Patient Instructions (Addendum)
Set up Venous Doppler in Early May .  Continue on Eliquis 5mg  Twice daily  .  follow up Dr. Craige CottaSood  In 3 months and As needed   Please contact office for sooner follow up if symptoms do not improve or worsen or seek emergency care

## 2016-04-29 NOTE — Assessment & Plan Note (Addendum)
PE (unprovoked 12/2016 , first time clot) plan for at least 6 months of Eliquis (complete in May )  Will need follow up ven doppler for R DVT  Then can decide on further tx options, referral to hematology , and Hypercoagulble panel , etc Repeat echo is improved w/ return to norma RV /TV . PAP decreased   Plan  Patient Instructions  Set up Venous Doppler in Early May .  Continue on Eliquis 5mg  Twice daily  .  follow up Dr. Craige CottaSood  In 3 months and As needed   Please contact office for sooner follow up if symptoms do not improve or worsen or seek emergency care

## 2016-05-02 NOTE — Progress Notes (Signed)
I have reviewed and agree with assessment/plan.  Coralyn HellingVineet Shadoe Bethel, MD [facility] 05/02/2016, 7:25 AM Pager:  [phone]

## 2016-07-05 ENCOUNTER — Inpatient Hospital Stay (HOSPITAL_COMMUNITY): Admission: RE | Admit: 2016-07-05 | Payer: Medicare Other | Source: Ambulatory Visit

## 2016-07-22 DIAGNOSIS — E559 Vitamin D deficiency, unspecified: Secondary | ICD-10-CM | POA: Diagnosis not present

## 2016-08-15 ENCOUNTER — Other Ambulatory Visit (INDEPENDENT_AMBULATORY_CARE_PROVIDER_SITE_OTHER): Payer: Medicare Other

## 2016-08-15 ENCOUNTER — Ambulatory Visit (INDEPENDENT_AMBULATORY_CARE_PROVIDER_SITE_OTHER): Payer: Medicare Other | Admitting: Pulmonary Disease

## 2016-08-15 ENCOUNTER — Encounter: Payer: Self-pay | Admitting: Pulmonary Disease

## 2016-08-15 VITALS — BP 124/70 | HR 81 | Ht 62.0 in | Wt 156.6 lb

## 2016-08-15 DIAGNOSIS — I824Y1 Acute embolism and thrombosis of unspecified deep veins of right proximal lower extremity: Secondary | ICD-10-CM | POA: Diagnosis not present

## 2016-08-15 DIAGNOSIS — I2699 Other pulmonary embolism without acute cor pulmonale: Secondary | ICD-10-CM

## 2016-08-15 LAB — COMPREHENSIVE METABOLIC PANEL
ALK PHOS: 60 U/L (ref 39–117)
ALT: 16 U/L (ref 0–35)
AST: 14 U/L (ref 0–37)
Albumin: 4.1 g/dL (ref 3.5–5.2)
BILIRUBIN TOTAL: 0.3 mg/dL (ref 0.2–1.2)
BUN: 11 mg/dL (ref 6–23)
CO2: 32 meq/L (ref 19–32)
CREATININE: 0.87 mg/dL (ref 0.40–1.20)
Calcium: 10.1 mg/dL (ref 8.4–10.5)
Chloride: 104 mEq/L (ref 96–112)
GFR: 81.33 mL/min (ref >60.00)
GLUCOSE: 100 mg/dL — AB (ref 70–99)
Potassium: 4.7 mEq/L (ref 3.5–5.1)
SODIUM: 139 meq/L (ref 135–145)
TOTAL PROTEIN: 7.4 g/dL (ref 6.0–8.3)

## 2016-08-15 LAB — CBC WITH DIFFERENTIAL/PLATELET
BASOS ABS: 0.1 10*3/uL (ref 0.0–0.1)
BASOS PCT: 1 % (ref 0.0–3.0)
EOS ABS: 0.2 10*3/uL (ref 0.0–0.7)
Eosinophils Relative: 3.9 % (ref 0.0–5.0)
HEMATOCRIT: 36.9 % (ref 36.0–46.0)
HEMOGLOBIN: 11.7 g/dL — AB (ref 12.0–15.0)
LYMPHS PCT: 49.4 % — AB (ref 12.0–46.0)
Lymphs Abs: 2.6 10*3/uL (ref 0.7–4.0)
MCHC: 31.8 g/dL (ref 30.0–36.0)
MCV: 84.9 fl (ref 78.0–100.0)
Monocytes Absolute: 0.5 10*3/uL (ref 0.1–1.0)
Monocytes Relative: 9.9 % (ref 3.0–12.0)
Neutro Abs: 1.9 10*3/uL (ref 1.4–7.7)
Neutrophils Relative %: 35.8 % — ABNORMAL LOW (ref 43.0–77.0)
Platelets: 313 10*3/uL (ref 150.0–400.0)
RBC: 4.35 Mil/uL (ref 3.87–5.11)
RDW: 14.6 % (ref 11.5–15.5)
WBC: 5.2 10*3/uL (ref 4.0–10.5)

## 2016-08-15 NOTE — Progress Notes (Signed)
Current Outpatient Prescriptions on File Prior to Visit  Medication Sig  . apixaban (ELIQUIS) 5 MG TABS tablet Take 1 tablet (5 mg total) by mouth 2 (two) times daily.  Marland Kitchen. aspirin 81 MG chewable tablet Chew 81 mg by mouth daily.  . metoprolol tartrate (LOPRESSOR) 25 MG tablet Take 1 tablet (25 mg total) by mouth 2 (two) times daily.   No current facility-administered medications on file prior to visit.      Chief Complaint  Patient presents with  . Follow-up    Pt states that she is doing well since last OV. Pt denies any SOB or increased breathing issues. Still on Eliquis.      Tests CT abd/pelvis 01/08/16 >> small b/l effusions, atherosclerosis Echo 01/11/16>> EF 65 to 70%, grade 1 DD, mod RV dilation, severe TR, PAS 52 mmHg, mild/mod pericardial effusion Lt thoracentesis 01/11/16 >> glucose 155, protein < 3, LDH 138, WBC 758 (50L, 73M, 11E) CTA Chest 01/12/16>> acute saddle PE with evidence of RV strain (ratio 1:1), small pleural effusions, partially loculated on R, small pericardial effusion B/l LE Duplex 01/13/16 >> DVT Rt popliteal, posterior tibial veins Echo 04/27/16 >> EF 60 to 65%, PAS 35 mmHg  Past medical history Diabetes  Past surgical history, Family history, Social history, Allergies all reviewed.  Vital Signs BP 124/70 (BP Location: Right Arm, Cuff Size: Normal)   Pulse 81   Ht 5\' 2"  (1.575 m)   Wt 156 lb 9.6 oz (71 kg)   LMP  (LMP Unknown)   SpO2 94%   BMI 28.64 kg/m   History of Present Illness Glenda Blankenship is a 76 y.o. female with PE and Rt leg DVT from November 2017.  She continues on eliquis.  No issues with bleeding.  Denies cough, sputum, chest pain, fever, hemoptysis, bruising, melena, abdominal pain, leg swelling, or dyspnea.  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 - will check labs and duplex Rt leg - will then determine if she can transition off anticoagulation >> continue eliquis for now - had extensive discussion with her about risk/benefit of anticoagulation to prevent recurrent clot versus bleeding risk  Time spent 28 minutes  Patient Instructions  Lab tests today  Will schedule ultrasound of Right leg  Follow up in 4 months    Coralyn HellingVineet Mahdiya Mossberg, MD [place] [facility]Critical [facility]/Sleep Pager:  [phone] 08/15/2016, 11:03 AM

## 2016-08-15 NOTE — Patient Instructions (Signed)
Lab tests today  Will schedule ultrasound of Right leg  Follow up in 4 months

## 2016-08-17 ENCOUNTER — Other Ambulatory Visit: Payer: Medicare Other

## 2016-08-17 DIAGNOSIS — R571 Hypovolemic shock: Secondary | ICD-10-CM

## 2016-08-17 LAB — RFLX DRVVT CONFRIM: DRVVT CONFIRMATION: NEGATIVE

## 2016-08-17 LAB — RFX PTT-LA W/RFX TO HEX PHASE CONF: PTT-LA SCREEN: 49 s — AB (ref <=40)

## 2016-08-17 LAB — RFX DRVVT SCR W/RFLX CONF 1:1 MIX: DRVVT SCREEN: 62 s — AB (ref <=45)

## 2016-08-17 LAB — RFLX HEXAGONAL PHASE CONFIRM: Hexagonal Phase Confirm: NEGATIVE

## 2016-08-18 ENCOUNTER — Other Ambulatory Visit: Payer: Medicare Other

## 2016-08-18 DIAGNOSIS — R571 Hypovolemic shock: Secondary | ICD-10-CM | POA: Diagnosis not present

## 2016-08-18 DIAGNOSIS — Z86010 Personal history of colonic polyps: Secondary | ICD-10-CM | POA: Diagnosis not present

## 2016-08-18 DIAGNOSIS — Z1211 Encounter for screening for malignant neoplasm of colon: Secondary | ICD-10-CM | POA: Diagnosis not present

## 2016-08-18 LAB — D-DIMER, QUANTITATIVE

## 2016-08-21 LAB — RFLX DRVVT CONFRIM: Drvvt confirmation: NEGATIVE

## 2016-08-21 LAB — RFX DRVVT SCR W/RFLX CONF 1:1 MIX: DRVVT SCREEN: 67 s — AB (ref <=45)

## 2016-08-21 LAB — RFX PTT-LA W/RFX TO HEX PHASE CONF: PTT-LA Screen: 52 s — ABNORMAL HIGH (ref <=40)

## 2016-08-21 LAB — RFLX HEXAGONAL PHASE CONFIRM: Hexagonal Phase Confirm: NEGATIVE

## 2016-08-24 LAB — HYPERCOAGULABLE PANEL, COMPREHENSIVE
ANTICARDIOLIPIN IGG: 20 [GPL'U] — AB
ANTICARDIOLIPIN IGM: 36 [MPL'U] — AB
ANTICARDIOLIPIN IGM: 29 [MPL'U] — AB
ANTITHROMB III FUNC: 98 %{activity} (ref 80–120)
AntiThromb III Func: 93 % activity (ref 80–120)
Anticardiolipin IgG: 21 [GPL'U] — ABNORMAL HIGH
Beta-2-Glycoprotein I IgA: 34 SAU — ABNORMAL HIGH (ref <=20)
Beta-2-Glycoprotein I IgA: 35 SAU — ABNORMAL HIGH (ref <=20)
Beta-2-Glycoprotein I IgM: <9 SMU (ref <=20)
PROTEIN C ACTIVITY: 117 % (ref 70–180)
PROTEIN C ACTIVITY: 126 % (ref 70–180)
PROTEIN C ANTIGEN: 86 % (ref 70–140)
PROTEIN S ACTIVITY: 84 % (ref 60–140)
Protein C Antigen: 83 % (ref 70–140)
Protein S Activity: 86 % (ref 60–140)

## 2016-08-26 ENCOUNTER — Ambulatory Visit (HOSPITAL_COMMUNITY)
Admission: RE | Admit: 2016-08-26 | Discharge: 2016-08-26 | Disposition: A | Discharge status: Home / Self Care | Payer: Medicare Other | Source: Ambulatory Visit | Attending: Cardiology | Admitting: Cardiology

## 2016-08-26 DIAGNOSIS — Z86718 Personal history of other venous thrombosis and embolism: Secondary | ICD-10-CM | POA: Insufficient documentation

## 2016-08-26 DIAGNOSIS — I824Y1 Acute embolism and thrombosis of unspecified deep veins of right proximal lower extremity: Secondary | ICD-10-CM

## 2016-09-01 DIAGNOSIS — I129 Hypertensive chronic kidney disease with stage 1 through stage 4 chronic kidney disease, or unspecified chronic kidney disease: Secondary | ICD-10-CM | POA: Diagnosis not present

## 2016-09-01 DIAGNOSIS — E782 Mixed hyperlipidemia: Secondary | ICD-10-CM | POA: Diagnosis not present

## 2016-09-01 DIAGNOSIS — E1121 Type 2 diabetes mellitus with diabetic nephropathy: Secondary | ICD-10-CM | POA: Diagnosis not present

## 2016-09-01 DIAGNOSIS — I82401 Acute embolism and thrombosis of unspecified deep veins of right lower extremity: Secondary | ICD-10-CM | POA: Diagnosis not present

## 2016-09-29 ENCOUNTER — Telehealth: Payer: Self-pay | Admitting: Pulmonary Disease

## 2016-09-29 NOTE — Telephone Encounter (Signed)
Hypercoagulable panel 08/15/16 >> Beta 2 Glycoprotein IgA 34, anticardiolipin IgG 21, anticardiolipin IgM 36  Lower extremity doppler 08/25/16 >> no DVT   Please let her know that her lab tests showed two findings that could put her at higher risk of developing recurrent blood clots if she stops taking eliquis.  We need to confirm that these levels remain high, and if they are then she would need to be on eliquis indefinitely.  She can have repeat beta 2 glycoprotein antibodies and anticardiolipin antibodies repeated in October 2018 prior to her next visit.  She needs to continue taking eliquis until then.

## 2016-09-30 NOTE — Telephone Encounter (Signed)
ATC, no voicemail set up. WCB 

## 2016-10-07 NOTE — Telephone Encounter (Signed)
ATC to call. VM is not setup. Will call back later.

## 2016-10-12 NOTE — Telephone Encounter (Addendum)
ATC numbers on file and mobile number's voicemail is full and the home number is disconnected.  [place], [phone], Mailbox full Will call again tomorrow and if unable to reach anyone then will send a letter.

## 2016-10-19 NOTE — Telephone Encounter (Signed)
ATC all numbers, No answer, mailboxes full.  Letter mailed to contact our office for results asap.  Nothing further needed.

## 2016-12-15 ENCOUNTER — Other Ambulatory Visit (INDEPENDENT_AMBULATORY_CARE_PROVIDER_SITE_OTHER): Payer: Medicare Other

## 2016-12-15 ENCOUNTER — Encounter: Payer: Self-pay | Admitting: Pulmonary Disease

## 2016-12-15 ENCOUNTER — Ambulatory Visit (INDEPENDENT_AMBULATORY_CARE_PROVIDER_SITE_OTHER): Payer: Medicare Other | Admitting: Pulmonary Disease

## 2016-12-15 VITALS — BP 114/64 | HR 81 | Ht 62.0 in | Wt 155.0 lb

## 2016-12-15 DIAGNOSIS — I2699 Other pulmonary embolism without acute cor pulmonale: Secondary | ICD-10-CM | POA: Diagnosis not present

## 2016-12-15 DIAGNOSIS — Z86711 Personal history of pulmonary embolism: Secondary | ICD-10-CM

## 2016-12-15 LAB — COMPREHENSIVE METABOLIC PANEL
ALBUMIN: 4.2 g/dL (ref 3.5–5.2)
ALK PHOS: 61 U/L (ref 39–117)
ALT: 12 U/L (ref 0–35)
AST: 18 U/L (ref 0–37)
BUN: 17 mg/dL (ref 6–23)
CHLORIDE: 98 meq/L (ref 96–112)
CO2: 33 mEq/L — ABNORMAL HIGH (ref 19–32)
Calcium: 10.4 mg/dL (ref 8.4–10.5)
Creatinine, Ser: 1.12 mg/dL (ref 0.40–1.20)
GFR: 60.71 mL/min (ref >60.00)
GLUCOSE: 98 mg/dL (ref 70–99)
POTASSIUM: 3.6 meq/L (ref 3.5–5.1)
Sodium: 137 mEq/L (ref 135–145)
TOTAL PROTEIN: 8.1 g/dL (ref 6.0–8.3)
Total Bilirubin: 0.3 mg/dL (ref 0.2–1.2)

## 2016-12-15 LAB — CBC
HEMATOCRIT: 33.4 % — AB (ref 36.0–46.0)
HEMOGLOBIN: 10.5 g/dL — AB (ref 12.0–15.0)
MCHC: 31.4 g/dL (ref 30.0–36.0)
MCV: 89.7 fl (ref 78.0–100.0)
Platelets: 325 10*3/uL (ref 150.0–400.0)
RBC: 3.72 Mil/uL — AB (ref 3.87–5.11)
RDW: 14.7 % (ref 11.5–15.5)
WBC: 5 10*3/uL (ref 4.0–10.5)

## 2016-12-15 NOTE — Progress Notes (Signed)
Current Outpatient Prescriptions on File Prior to Visit  Medication Sig  . apixaban (ELIQUIS) 5 MG TABS tablet Take 1 tablet (5 mg total) by mouth 2 (two) times daily.  Marland Kitchen. aspirin 81 MG chewable tablet Chew 81 mg by mouth daily.  . metoprolol tartrate (LOPRESSOR) 25 MG tablet Take 1 tablet (25 mg total) by mouth 2 (two) times daily.  . potassium citrate (UROCIT-K) 10 MEQ (1080 MG) SR tablet Take 1 tablet by mouth 3 (three) times daily.   No current facility-administered medications on file prior to visit.      Chief Complaint  Patient presents with  . Follow-up    Pt is doing well overall     Tests CT abd/pelvis 01/08/16 >> small b/l effusions, atherosclerosis Echo 01/11/16>> EF 65 to 70%, grade 1 DD, mod RV dilation, severe TR, PAS 52 mmHg, mild/mod pericardial effusion Lt thoracentesis 01/11/16 >> glucose 155, protein < 3, LDH 138, WBC 758 (50L, 34M, 11E) CTA Chest 01/12/16>> acute saddle PE with evidence of RV strain (ratio 1:1), small pleural effusions, partially loculated on R, small pericardial effusion B/l LE Duplex 01/13/16 >> DVT Rt popliteal, posterior tibial veins Echo 04/27/16 >> EF 60 to 65%, PAS 35 mmHg Hypercoagulable panel 08/15/16 >> Beta 2 Glycoprotein IgA 34, anticardiolipin IgG 21, anticardiolipin IgM 36 Lower extremity doppler 08/25/16 >> no DVT  Past medical history Diabetes  Past surgical history, Family history, Social history, Allergies all reviewed.  Vital Signs BP 114/64 (BP Location: Left Arm, Cuff Size: Normal)   Pulse 81   Ht 5\' 2"  (1.575 m)   Wt 155 lb (70.3 kg)   LMP  (LMP Unknown)   SpO2 95%   BMI 28.35 kg/m   History of Present Illness Glenda Blankenship is a 76 y.o. female with PE and Rt leg DVT from November 2017.  She has been doing well.  Denies chest pain, cough, dyspnea, fever, hemoptysis, leg swelling, or melena.    She has been followed by Dr. Loreta AveMann with GI.  She was advised to get a screening colonoscopy, but needed to be off eliquis  first.  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 - will repeat CBC, CMET, Beta 2 glycoprotein, anticardiolipin, and d dimer - will then determine if she should remain on eliquis >> continue for now  Screening colonoscopy. - will determine if she can hold eliquis prior to colonoscopy after review of her lab tests   Patient Instructions  Lab tests today  Follow up in 1 year    Coralyn HellingVineet Sybilla Malhotra, MD  Pulmonary/Critical Care/Sleep Pager:  [phone] 12/15/2016, 11:26 AM

## 2016-12-15 NOTE — Patient Instructions (Signed)
Lab tests today  Follow up in 1 year 

## 2016-12-16 LAB — CARDIOLIPIN ANTIBODIES, IGG, IGM, IGA
ANTICARDIOLIPIN IGG: 21 [GPL'U] — AB
ANTICARDIOLIPIN IGM: 33 [MPL'U] — AB
Anticardiolipin IgA: 11 [APL'U]

## 2016-12-16 LAB — D-DIMER, QUANTITATIVE (NOT AT ARMC): D DIMER QUANT: 0.88 ug{FEU}/mL — AB (ref <0.50)

## 2016-12-19 DIAGNOSIS — N182 Chronic kidney disease, stage 2 (mild): Secondary | ICD-10-CM | POA: Diagnosis not present

## 2016-12-19 DIAGNOSIS — E1121 Type 2 diabetes mellitus with diabetic nephropathy: Secondary | ICD-10-CM | POA: Diagnosis not present

## 2016-12-19 DIAGNOSIS — Z0000 Encounter for general adult medical examination without abnormal findings: Secondary | ICD-10-CM | POA: Diagnosis not present

## 2016-12-19 DIAGNOSIS — N08 Glomerular disorders in diseases classified elsewhere: Secondary | ICD-10-CM | POA: Diagnosis not present

## 2016-12-19 DIAGNOSIS — E782 Mixed hyperlipidemia: Secondary | ICD-10-CM | POA: Diagnosis not present

## 2016-12-19 DIAGNOSIS — Z23 Encounter for immunization: Secondary | ICD-10-CM | POA: Diagnosis not present

## 2016-12-19 DIAGNOSIS — Z1231 Encounter for screening mammogram for malignant neoplasm of breast: Secondary | ICD-10-CM | POA: Diagnosis not present

## 2016-12-19 DIAGNOSIS — I129 Hypertensive chronic kidney disease with stage 1 through stage 4 chronic kidney disease, or unspecified chronic kidney disease: Secondary | ICD-10-CM | POA: Diagnosis not present

## 2016-12-20 LAB — BETA-2-GLYCOPROTEIN I ABS, IGG/M/A
BETA 2 GLYCO 1 IGA: 31 GPI IgA units — AB (ref 0–25)
Beta-2 Glyco 1 IgM: <9 GPI IgM units (ref 0–32)
Beta-2 Glyco I IgG: <9 GPI IgG units (ref 0–20)

## 2016-12-22 ENCOUNTER — Telehealth: Payer: Self-pay | Admitting: Pulmonary Disease

## 2016-12-22 NOTE — Telephone Encounter (Signed)
Labs 12/15/16 >> Beta 2 glycoprotein 31, anticardiolipin IgM 33, anticardiolipin IgG 21, D dimer 0.88   Results d/w pt.  Explained that she will need to continue taking eliquis.

## 2017-01-30 ENCOUNTER — Other Ambulatory Visit: Payer: Self-pay | Admitting: Acute Care

## 2017-04-18 ENCOUNTER — Other Ambulatory Visit: Payer: Self-pay | Admitting: Pulmonary Disease

## 2017-06-19 DIAGNOSIS — N08 Glomerular disorders in diseases classified elsewhere: Secondary | ICD-10-CM | POA: Diagnosis not present

## 2017-06-19 DIAGNOSIS — E1121 Type 2 diabetes mellitus with diabetic nephropathy: Secondary | ICD-10-CM | POA: Diagnosis not present

## 2017-06-19 DIAGNOSIS — E782 Mixed hyperlipidemia: Secondary | ICD-10-CM | POA: Diagnosis not present

## 2017-06-19 DIAGNOSIS — M109 Gout, unspecified: Secondary | ICD-10-CM | POA: Diagnosis not present

## 2017-06-19 DIAGNOSIS — R7309 Other abnormal glucose: Secondary | ICD-10-CM | POA: Diagnosis not present

## 2017-06-19 DIAGNOSIS — I129 Hypertensive chronic kidney disease with stage 1 through stage 4 chronic kidney disease, or unspecified chronic kidney disease: Secondary | ICD-10-CM | POA: Diagnosis not present

## 2017-06-19 DIAGNOSIS — N182 Chronic kidney disease, stage 2 (mild): Secondary | ICD-10-CM | POA: Diagnosis not present

## 2017-06-19 DIAGNOSIS — Z7901 Long term (current) use of anticoagulants: Secondary | ICD-10-CM | POA: Diagnosis not present

## 2017-08-08 DIAGNOSIS — H11001 Unspecified pterygium of right eye: Secondary | ICD-10-CM | POA: Diagnosis not present

## 2017-08-08 DIAGNOSIS — E109 Type 1 diabetes mellitus without complications: Secondary | ICD-10-CM | POA: Diagnosis not present

## 2017-08-08 DIAGNOSIS — H25812 Combined forms of age-related cataract, left eye: Secondary | ICD-10-CM | POA: Diagnosis not present

## 2017-08-08 DIAGNOSIS — H2511 Age-related nuclear cataract, right eye: Secondary | ICD-10-CM | POA: Diagnosis not present

## 2017-08-08 DIAGNOSIS — Z01818 Encounter for other preprocedural examination: Secondary | ICD-10-CM | POA: Diagnosis not present

## 2017-08-08 DIAGNOSIS — H43811 Vitreous degeneration, right eye: Secondary | ICD-10-CM | POA: Diagnosis not present

## 2017-08-17 DIAGNOSIS — H25812 Combined forms of age-related cataract, left eye: Secondary | ICD-10-CM | POA: Diagnosis not present

## 2017-08-17 DIAGNOSIS — H25819 Combined forms of age-related cataract, unspecified eye: Secondary | ICD-10-CM | POA: Diagnosis not present

## 2017-08-17 DIAGNOSIS — H2512 Age-related nuclear cataract, left eye: Secondary | ICD-10-CM | POA: Diagnosis not present

## 2017-11-01 ENCOUNTER — Other Ambulatory Visit: Payer: Self-pay | Admitting: Pulmonary Disease

## 2017-11-03 ENCOUNTER — Emergency Department (HOSPITAL_COMMUNITY)
Admission: EM | Admit: 2017-11-03 | Discharge: 2017-11-03 | Disposition: A | Discharge status: Home / Self Care | Payer: Medicare Other | Attending: Emergency Medicine | Admitting: Emergency Medicine

## 2017-11-03 ENCOUNTER — Encounter (HOSPITAL_COMMUNITY): Payer: Self-pay | Admitting: Emergency Medicine

## 2017-11-03 DIAGNOSIS — Z79899 Other long term (current) drug therapy: Secondary | ICD-10-CM | POA: Insufficient documentation

## 2017-11-03 DIAGNOSIS — L0100 Impetigo, unspecified: Secondary | ICD-10-CM

## 2017-11-03 DIAGNOSIS — I10 Essential (primary) hypertension: Secondary | ICD-10-CM | POA: Diagnosis not present

## 2017-11-03 DIAGNOSIS — E119 Type 2 diabetes mellitus without complications: Secondary | ICD-10-CM | POA: Diagnosis not present

## 2017-11-03 DIAGNOSIS — Z7901 Long term (current) use of anticoagulants: Secondary | ICD-10-CM | POA: Diagnosis not present

## 2017-11-03 DIAGNOSIS — Z87891 Personal history of nicotine dependence: Secondary | ICD-10-CM | POA: Insufficient documentation

## 2017-11-03 DIAGNOSIS — R21 Rash and other nonspecific skin eruption: Secondary | ICD-10-CM | POA: Diagnosis present

## 2017-11-03 LAB — BASIC METABOLIC PANEL
ANION GAP: 9 (ref 5–15)
BUN: 17 mg/dL (ref 8–23)
CO2: 32 mmol/L (ref 22–32)
Calcium: 10 mg/dL (ref 8.9–10.3)
Chloride: 101 mmol/L (ref 98–111)
Creatinine, Ser: 1.15 mg/dL — ABNORMAL HIGH (ref 0.44–1.00)
GFR, EST AFRICAN AMERICAN: 52 mL/min — AB (ref >60)
GFR, EST NON AFRICAN AMERICAN: 45 mL/min — AB (ref >60)
Glucose, Bld: 124 mg/dL — ABNORMAL HIGH (ref 70–99)
POTASSIUM: 3.1 mmol/L — AB (ref 3.5–5.1)
Sodium: 142 mmol/L (ref 135–145)

## 2017-11-03 LAB — CBC WITH DIFFERENTIAL/PLATELET
Abs Immature Granulocytes: 0 10*3/uL (ref 0.0–0.1)
BASOS ABS: 0 10*3/uL (ref 0.0–0.1)
Basophils Relative: 0 %
EOS PCT: 7 %
Eosinophils Absolute: 0.5 10*3/uL (ref 0.0–0.7)
HCT: 40 % (ref 36.0–46.0)
HEMOGLOBIN: 11.5 g/dL — AB (ref 12.0–15.0)
Immature Granulocytes: 0 %
LYMPHS PCT: 37 %
Lymphs Abs: 2.3 10*3/uL (ref 0.7–4.0)
MCH: 24.7 pg — ABNORMAL LOW (ref 26.0–34.0)
MCHC: 28.8 g/dL — AB (ref 30.0–36.0)
MCV: 86 fL (ref 78.0–100.0)
Monocytes Absolute: 0.6 10*3/uL (ref 0.1–1.0)
Monocytes Relative: 9 %
NEUTROS ABS: 2.9 10*3/uL (ref 1.7–7.7)
Neutrophils Relative %: 47 %
Platelets: 366 10*3/uL (ref 150–400)
RBC: 4.65 MIL/uL (ref 3.87–5.11)
RDW: 15.1 % (ref 11.5–15.5)
WBC: 6.3 10*3/uL (ref 4.0–10.5)

## 2017-11-03 MED ORDER — CEPHALEXIN 250 MG PO CAPS: 250.0000 mg | ORAL_CAPSULE | Freq: Four times a day (QID) | ORAL | 0 refills | Status: AC

## 2017-11-03 MED ORDER — CEPHALEXIN 250 MG PO CAPS: 250.0000 mg | ORAL_CAPSULE | Freq: Four times a day (QID) | ORAL | 0 refills | Status: DC

## 2017-11-03 NOTE — ED Triage Notes (Signed)
Pt presents to ED for assessment of chronic rash, returning 2 weeks ago, to her left forearm, cheeks, and other random spots on her torso/back.  Told it was "eczema" in the past.

## 2017-11-03 NOTE — Discharge Instructions (Signed)
I have prescribed antibiotics for you rash.Please take 1 capsule four times a day for 7 days. Please schedule an appointment with PCP after completion of antibiotics. If you experience a fever, shortness of breath or chest pain please return to the ED.

## 2017-11-03 NOTE — ED Provider Notes (Signed)
[facility] EMERGENCY DEPARTMENT Provider Note   CSN: [id number] Arrival date & time: 11/03/17  1424     History   Chief Complaint Chief Complaint  Patient presents with  . Rash    HPI Glenda Blankenship is a 77 y.o. female.  77 y/o female with a PMH of DM, HTN and DVT presents to the ED with a chief complaint of rash x 2 weeks. Patient reports the rash first appeared on her left arm and now has spread to her face and ears. She describes the rash as itchy and scaling looking. She has tried Vaseline and aloe vera but states no relieve in symptoms. Patient had an abscess drained by her dentist a week ago and placed on antibiotics, she reports the rash began after completion in therapy. She denies any fever, URI symptoms, chest pain or shortness of breath.      Past Medical History:  Diagnosis Date  . Diabetes [facility])     Patient Active Problem List   Diagnosis Date Noted  . Acute saddle pulmonary embolism with acute cor pulmonale (HCC)   . Deep venous thrombosis (HCC)   . Septic shock (HCC)   . Hyperkalemia   . Gastroenteritis   . Pleural effusion, right   . Hypovolemic shock (HCC) 01/08/2016  . Shock (HCC) 01/08/2016  . Acute kidney injury (HCC)   . Metabolic acidosis   . Lactic acidosis     History reviewed. No pertinent surgical history.   OB History   None      Home Medications    Prior to Admission medications   Medication Sig Start Date End Date Taking? Authorizing Provider  apixaban (ELIQUIS) 5 MG TABS tablet Take 1 tablet (5 mg total) by mouth 2 (two) times daily. 04/29/16   Parrett, Virgel Bouquet, NP  aspirin 81 MG chewable tablet Chew 81 mg by mouth daily.    [provider]  cephALEXin (KEFLEX) 250 MG capsule Take 1 capsule (250 mg total) by mouth 4 (four) times daily for 7 days. 11/03/17 11/10/17  Avyay Coger, Leonie Douglas, PA-C  ELIQUIS 5 MG TABS tablet TAKE 1 TABLET BY MOUTH TWICE DAILY 11/01/17   Coralyn Helling, MD  metoprolol tartrate (LOPRESSOR) 25  MG tablet Take 1 tablet (25 mg total) by mouth 2 (two) times daily. 01/14/16   Minor, Vilinda Blanks, NP  potassium citrate (UROCIT-K) 10 MEQ (1080 MG) SR tablet Take 1 tablet by mouth 3 (three) times daily. 08/01/16   [provider]    Family History History reviewed. No pertinent family history.  Social History Social History   Tobacco Use  . Smoking status: Former Games developer  . Smokeless tobacco: Never Used  Substance Use Topics  . Alcohol use: No  . Drug use: No     Allergies   Patient has no known allergies.   Review of Systems Review of Systems  Constitutional: Negative for fever.  Respiratory: Negative for shortness of breath.   Cardiovascular: Negative for chest pain.  Gastrointestinal: Negative for abdominal pain.  Skin: Positive for rash.  All other systems reviewed and are negative.    Physical Exam Updated Vital Signs BP (!) 138/55   Pulse 97   Temp 98.4 F (36.9 C) (Oral)   Resp 16   LMP  (LMP Unknown)   SpO2 100%   Physical Exam  Constitutional: She is oriented to person, place, and time. She appears well-developed and well-nourished.  HENT:  Head: Normocephalic and atraumatic.  Eyes: Pupils are  equal, round, and reactive to light.  Neck: Normal range of motion. Neck supple.  Cardiovascular: Normal heart sounds.  Pulmonary/Chest: Effort normal.  Abdominal: Soft.  Musculoskeletal: She exhibits no tenderness or deformity.  Neurological: She is alert and oriented to person, place, and time.  Skin: Skin is warm. Rash noted.  Scaling rash noted to the left arm and BL external ear. Active drainage coming from the ears.   Nursing note and vitals reviewed.    ED Treatments / Results  Labs (all labs ordered are listed, but only abnormal results are displayed) Labs Reviewed  BASIC METABOLIC PANEL - Abnormal; Notable for the following components:      Result Value   Potassium 3.1 (*)    Glucose, Bld 124 (*)    Creatinine, Ser 1.15 (*)    GFR  calc non Af Amer 45 (*)    GFR calc Af Amer 52 (*)    All other components within normal limits  CBC WITH DIFFERENTIAL/PLATELET - Abnormal; Notable for the following components:   Hemoglobin 11.5 (*)    MCH 24.7 (*)    MCHC 28.8 (*)    All other components within normal limits    EKG None  Radiology No results found.  Procedures Procedures (including critical care time)  Medications Ordered in ED Medications - No data to display   Initial Impression / Assessment and Plan / ED Course  I have reviewed the triage vital signs and the nursing notes.  Pertinent labs & imaging results that were available during my care of the patient were reviewed by me and considered in my medical decision making (see chart for details).     Patient presents with a chief complaint of rash x 2 weeks. Upon examination she appears to left arm along with face and bilateral ears.  There is drainage coming from both ears at this time.  Dr. Jacqulyn Bath has seen and evaluated this patient with me.  At this time patient will be treated for impetigo with Keflex 250 mg 4 times a day for the next 7 days.  Patient is advised to follow-up with PCP after completion of antibiotics for resolution in symptoms.  Patient agrees and understands plan.  Return precautions provided.  Final Clinical Impressions(s) / ED Diagnoses   Final diagnoses:  Impetigo    ED Discharge Orders         Ordered    cephALEXin (KEFLEX) 250 MG capsule  4 times daily     11/03/17 1629           Claude Manges, PA-C 11/03/17 1656    Long, Arlyss Repress, MD 11/04/17 0121

## 2017-11-03 NOTE — ED Provider Notes (Signed)
Patient placed in Quick Look pathway, seen and evaluated   Chief Complaint: Rash  HPI: Patient presents today for evaluation of a rash on her face and left arm along with reportedly on her torso/back.  She says this started a few weeks ago after she was outside in the sun.  She is diabetic, has not checked her sugar in over a week because she has been "busy" and just not checked it.  ROS: Itching (one  Physical Exam:   Gen: No distress  Neuro: Awake and Alert  Skin: Warm    Focused Exam: Rash over bilateral face, ears, with induration.  Rash on left forearm.  Please see pictures.            Initiation of care has begun. The patient has been counseled on the process, plan, and necessity for staying for the completion/evaluation, and the remainder of the medical screening examination    Norman Clay 11/03/17 1440    Tilden Fossa, MD 11/03/17 [phone]

## 2017-12-07 ENCOUNTER — Telehealth: Payer: Self-pay | Admitting: Internal Medicine

## 2017-12-07 NOTE — Telephone Encounter (Signed)
I called the patient to schedule her AWV w/ Herbie Saxon, but her voicemail hasn't been set up yet. VDM (DD)

## 2017-12-12 ENCOUNTER — Telehealth: Payer: Self-pay | Admitting: Internal Medicine

## 2017-12-12 NOTE — Telephone Encounter (Signed)
2nd attempt to reach pt to schedule AWV w/ [place].  No answer and voicemail not set up. VDM (DD)

## 2017-12-21 ENCOUNTER — Telehealth: Payer: Self-pay | Admitting: Internal Medicine

## 2017-12-25 ENCOUNTER — Ambulatory Visit: Payer: Self-pay | Admitting: Nurse Practitioner

## 2018-01-20 NOTE — Telephone Encounter (Signed)
4th attempt to reach pt.Glenda Blankenship.Glenda Blankenship.I have been unable to reach this patient by phone.  VDM (DD)

## 2018-01-26 ENCOUNTER — Encounter (HOSPITAL_COMMUNITY): Payer: Self-pay

## 2018-01-26 ENCOUNTER — Other Ambulatory Visit: Payer: Self-pay

## 2018-01-26 ENCOUNTER — Emergency Department (HOSPITAL_COMMUNITY): Payer: Medicare Other

## 2018-01-26 ENCOUNTER — Inpatient Hospital Stay (HOSPITAL_COMMUNITY)
Admission: EM | Admit: 2018-01-26 | Discharge: 2018-01-29 | DRG: 175 | Disposition: A | Discharge status: Home / Self Care | Payer: Medicare Other | Attending: Internal Medicine | Admitting: Internal Medicine

## 2018-01-26 DIAGNOSIS — N183 Chronic kidney disease, stage 3 unspecified: Secondary | ICD-10-CM

## 2018-01-26 DIAGNOSIS — E1165 Type 2 diabetes mellitus with hyperglycemia: Secondary | ICD-10-CM

## 2018-01-26 DIAGNOSIS — L853 Xerosis cutis: Secondary | ICD-10-CM | POA: Diagnosis present

## 2018-01-26 DIAGNOSIS — I10 Essential (primary) hypertension: Secondary | ICD-10-CM

## 2018-01-26 DIAGNOSIS — I824Z1 Acute embolism and thrombosis of unspecified deep veins of right distal lower extremity: Secondary | ICD-10-CM | POA: Diagnosis present

## 2018-01-26 DIAGNOSIS — Z7982 Long term (current) use of aspirin: Secondary | ICD-10-CM

## 2018-01-26 DIAGNOSIS — D649 Anemia, unspecified: Secondary | ICD-10-CM

## 2018-01-26 DIAGNOSIS — Z91120 Patient's intentional underdosing of medication regimen due to financial hardship: Secondary | ICD-10-CM

## 2018-01-26 DIAGNOSIS — I2609 Other pulmonary embolism with acute cor pulmonale: Secondary | ICD-10-CM | POA: Diagnosis not present

## 2018-01-26 DIAGNOSIS — R21 Rash and other nonspecific skin eruption: Secondary | ICD-10-CM

## 2018-01-26 DIAGNOSIS — E1122 Type 2 diabetes mellitus with diabetic chronic kidney disease: Secondary | ICD-10-CM | POA: Diagnosis present

## 2018-01-26 DIAGNOSIS — N179 Acute kidney failure, unspecified: Secondary | ICD-10-CM | POA: Diagnosis not present

## 2018-01-26 DIAGNOSIS — M109 Gout, unspecified: Secondary | ICD-10-CM | POA: Diagnosis present

## 2018-01-26 DIAGNOSIS — I2699 Other pulmonary embolism without acute cor pulmonale: Secondary | ICD-10-CM | POA: Diagnosis present

## 2018-01-26 DIAGNOSIS — L919 Hypertrophic disorder of the skin, unspecified: Secondary | ICD-10-CM | POA: Diagnosis present

## 2018-01-26 DIAGNOSIS — I252 Old myocardial infarction: Secondary | ICD-10-CM

## 2018-01-26 DIAGNOSIS — T45516A Underdosing of anticoagulants, initial encounter: Secondary | ICD-10-CM | POA: Diagnosis present

## 2018-01-26 DIAGNOSIS — Z86718 Personal history of other venous thrombosis and embolism: Secondary | ICD-10-CM

## 2018-01-26 DIAGNOSIS — Z66 Do not resuscitate: Secondary | ICD-10-CM | POA: Diagnosis present

## 2018-01-26 DIAGNOSIS — Z86711 Personal history of pulmonary embolism: Secondary | ICD-10-CM

## 2018-01-26 DIAGNOSIS — Z87891 Personal history of nicotine dependence: Secondary | ICD-10-CM

## 2018-01-26 DIAGNOSIS — I129 Hypertensive chronic kidney disease with stage 1 through stage 4 chronic kidney disease, or unspecified chronic kidney disease: Secondary | ICD-10-CM | POA: Diagnosis present

## 2018-01-26 DIAGNOSIS — Z79899 Other long term (current) drug therapy: Secondary | ICD-10-CM

## 2018-01-26 DIAGNOSIS — L309 Dermatitis, unspecified: Secondary | ICD-10-CM | POA: Diagnosis present

## 2018-01-26 DIAGNOSIS — I248 Other forms of acute ischemic heart disease: Secondary | ICD-10-CM | POA: Diagnosis present

## 2018-01-26 DIAGNOSIS — E119 Type 2 diabetes mellitus without complications: Secondary | ICD-10-CM

## 2018-01-26 HISTORY — DX: Disorder of kidney and ureter, unspecified: N28.9

## 2018-01-26 HISTORY — DX: Essential (primary) hypertension: I10

## 2018-01-26 HISTORY — DX: Pneumonia, unspecified organism: J18.9

## 2018-01-26 HISTORY — DX: Deep phlebothrombosis in pregnancy, unspecified trimester: O22.30

## 2018-01-26 LAB — PROTIME-INR
INR: 1.07
Prothrombin Time: 13.8 seconds (ref 11.4–15.2)

## 2018-01-26 LAB — CBC WITH DIFFERENTIAL/PLATELET
ABS IMMATURE GRANULOCYTES: 0.03 10*3/uL (ref 0.00–0.07)
Basophils Absolute: 0 10*3/uL (ref 0.0–0.1)
Basophils Relative: 1 %
Eosinophils Absolute: 0.3 10*3/uL (ref 0.0–0.5)
Eosinophils Relative: 5 %
HCT: 33.1 % — ABNORMAL LOW (ref 36.0–46.0)
HEMOGLOBIN: 9.8 g/dL — AB (ref 12.0–15.0)
Immature Granulocytes: 1 %
LYMPHS PCT: 28 %
Lymphs Abs: 1.5 10*3/uL (ref 0.7–4.0)
MCH: 25.5 pg — ABNORMAL LOW (ref 26.0–34.0)
MCHC: 29.6 g/dL — ABNORMAL LOW (ref 30.0–36.0)
MCV: 86 fL (ref 80.0–100.0)
MONOS PCT: 6 %
Monocytes Absolute: 0.3 10*3/uL (ref 0.1–1.0)
NEUTROS ABS: 3.3 10*3/uL (ref 1.7–7.7)
Neutrophils Relative %: 59 %
Platelets: 242 10*3/uL (ref 150–400)
RBC: 3.85 MIL/uL — AB (ref 3.87–5.11)
RDW: 15.7 % — ABNORMAL HIGH (ref 11.5–15.5)
WBC: 5.4 10*3/uL (ref 4.0–10.5)
nRBC: 0 % (ref 0.0–0.2)

## 2018-01-26 LAB — BASIC METABOLIC PANEL
Anion gap: 10 (ref 5–15)
BUN: 43 mg/dL — ABNORMAL HIGH (ref 8–23)
CHLORIDE: 103 mmol/L (ref 98–111)
CO2: 26 mmol/L (ref 22–32)
Calcium: 9.5 mg/dL (ref 8.9–10.3)
Creatinine, Ser: 1.28 mg/dL — ABNORMAL HIGH (ref 0.44–1.00)
GFR calc Af Amer: 47 mL/min — ABNORMAL LOW (ref >60)
GFR calc non Af Amer: 40 mL/min — ABNORMAL LOW (ref >60)
GLUCOSE: 102 mg/dL — AB (ref 70–99)
POTASSIUM: 3.7 mmol/L (ref 3.5–5.1)
Sodium: 139 mmol/L (ref 135–145)

## 2018-01-26 LAB — POCT I-STAT TROPONIN I: TROPONIN I, POC: 0 ng/mL (ref 0.00–0.08)

## 2018-01-26 LAB — HEPARIN LEVEL (UNFRACTIONATED): Heparin Unfractionated: <0.1 IU/mL — ABNORMAL LOW (ref 0.30–0.70)

## 2018-01-26 LAB — APTT: aPTT: 35 seconds (ref 24–36)

## 2018-01-26 MED ORDER — SODIUM CHLORIDE (PF) 0.9 % IJ SOLN
INTRAMUSCULAR | Status: AC
  Administered 2018-01-26: 10 mL
  Filled 2018-01-26: qty 50

## 2018-01-26 MED ORDER — HEPARIN (PORCINE) 25000 UT/250ML-% IV SOLN
850.0000 [IU]/h | INTRAVENOUS | Status: DC
  Administered 2018-01-26: 950 [IU]/h via INTRAVENOUS
  Administered 2018-01-28: 850 [IU]/h via INTRAVENOUS
  Filled 2018-01-26 (×2): qty 250

## 2018-01-26 MED ORDER — IOPAMIDOL (ISOVUE-370) INJECTION 76%
INTRAVENOUS | Status: AC
  Filled 2018-01-26: qty 100

## 2018-01-26 MED ORDER — IOPAMIDOL (ISOVUE-370) INJECTION 76%
100.0000 mL | Freq: Once | INTRAVENOUS | Status: AC | PRN
  Administered 2018-01-26: 100 mL via INTRAVENOUS

## 2018-01-26 MED ORDER — SODIUM CHLORIDE 0.9 % IV BOLUS
500.0000 mL | Freq: Once | INTRAVENOUS | Status: AC
  Administered 2018-01-26: 500 mL via INTRAVENOUS

## 2018-01-26 NOTE — Progress Notes (Signed)
ANTICOAGULATION CONSULT NOTE - Initial Consult  Pharmacy Consult for IV heparin Indication: pulmonary embolus  No Known Allergies  Patient Measurements: Height: 5\' 2"  (157.5 cm) Weight: 140 lb (63.5 kg) IBW/kg (Calculated) : 50.1 Heparin Dosing Weight: 54 kg  Vital Signs: Temp: 99.2 F (37.3 C) (11/29 1804) Temp Source: Oral (11/29 1804) BP: 113/70 (11/29 2006) Pulse Rate: 89 (11/29 2006)  Labs: Recent Labs    01/26/18 2056  HGB 9.8*  HCT 33.1*  PLT 242  CREATININE 1.28*    Estimated Creatinine Clearance: 32.2 mL/min (A) (by C-G formula based on SCr of 1.28 mg/dL (H)).   Medical History: Past Medical History:  Diagnosis Date  . Diabetes (HCC)   . DVT (deep vein thrombosis) in pregnancy   . Hypertension   . Pneumonia   . Renal disorder     Medications:  Scheduled:  . iopamidol      . sodium chloride (PF)       Infusions:  . sodium chloride 500 mL (01/26/18 2234)    Assessment: 77 yoF c/o SOB and rash on PTA apixaban for hx of PE. CT reveals acute bilateral PE with evidence of right heart strain.  LD apixaban 11/29 0900.  Baseline labs:  H/H= 9.8/33.1, plts = 242 coags pending Aptt = 35 sec, INR=1.07 and HL <0.10 (compliance?)  Goal of Therapy:  Heparin level 0.3-0.7 units/ml  Monitor platelets by anticoagulation protocol: Yes   Plan:  Baseline aPtt/PT/INR and HL STAT Give 2000 unit bolus x1  Start heparin drip at 950 units/hr  Daily HL/CBC Check 1st HL in 8 hours   Lorenza EvangelistGreen, Zamani Crocker R 01/26/2018,10:41 PM

## 2018-01-26 NOTE — ED Triage Notes (Signed)
Patient c/o SOB x 1 week. Patient states when she gets up and tries to do anything she has SOB.  Patient has a rash on her face and neck since summer.

## 2018-01-26 NOTE — ED Provider Notes (Signed)
[facility]-EMERGENCY DEPT Provider Note   CSN: [id number] Arrival date & time: 01/26/18  1728     History   Chief Complaint Chief Complaint  Patient presents with  . Shortness of Breath  . Rash    HPI Glenda Blankenship is a 77 y.o. female.  The history is provided by the patient. No language interpreter was used.  Shortness of Breath  Associated symptoms include rash.  Rash     Glenda Blankenship is a 77 y.o. female who presents to the Emergency Department complaining of sob and rash. She presents to the emergency department with complaints of both shortness of breath and rash. In terms of the shortness of breath this is been ongoing for the last week described as dyspnea on exertion and decreased activity tolerance. She denies any fevers, chills, chest pain, cough, leg swelling or pain. 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 No similar symptoms in the past. She is compliant with her medications. She denies any black or body stools. In terms of rash she is experienced in intermittent rash to her face and neck since the summer. Rash is described as itching in nature. It previously had been draining and she was treated with antibiotics. It no longer drains but it does turn white and dry when she applies aloe vera. Past Medical History:  Diagnosis Date  . Diabetes (HCC)   . DVT (deep vein thrombosis) in pregnancy   . Hypertension   . Pneumonia   . Renal disorder     Patient Active Problem List   Diagnosis Date Noted  . Acute pulmonary embolism (HCC) 01/26/2018  . Acute saddle pulmonary embolism with acute cor pulmonale (HCC)   . Deep venous thrombosis (HCC)   . Septic shock (HCC)   . Hyperkalemia   . Gastroenteritis   . Pleural effusion, right   . Hypovolemic shock (HCC) 01/08/2016  . Shock (HCC) 01/08/2016  . Acute kidney injury (HCC)   . Metabolic acidosis   . Lactic acidosis     Past Surgical History:  Procedure  Laterality Date  . kidney stone removal       OB History   None      Home Medications    Prior to Admission medications   Medication Sig Start Date End Date Taking? Authorizing Provider  allopurinol (ZYLOPRIM) 300 MG tablet Take 300 mg by mouth daily.   Yes [provider]  apixaban (ELIQUIS) 5 MG TABS tablet Take 1 tablet (5 mg total) by mouth 2 (two) times daily. 04/29/16  Yes Parrett, Virgel Bouquet, NP  aspirin 81 MG chewable tablet Chew 81 mg by mouth daily.   Yes [provider]  lisinopril-hydrochlorothiazide (PRINZIDE,ZESTORETIC) 20-25 MG tablet Take 1 tablet by mouth daily. 10/28/17  Yes [provider]  potassium citrate (UROCIT-K) 10 MEQ (1080 MG) SR tablet Take 1 tablet by mouth 3 (three) times daily. 08/01/16  Yes [provider]  ELIQUIS 5 MG TABS tablet TAKE 1 TABLET BY MOUTH TWICE DAILY Patient not taking: Reported on 01/26/2018 11/01/17   Coralyn Helling, MD  metoprolol tartrate (LOPRESSOR) 25 MG tablet Take 1 tablet (25 mg total) by mouth 2 (two) times daily. Patient not taking: Reported on 01/26/2018 01/14/16   Minor, Vilinda Blanks, NP    Family History History reviewed. No pertinent family history.  Social History Social History   Tobacco Use  . Smoking status: Former Smoker    Types:  Cigarettes  . Smokeless tobacco: Never Used  Substance Use Topics  . Alcohol use: No  . Drug use: No     Allergies   Patient has no known allergies.   Review of Systems Review of Systems  Respiratory: Positive for shortness of breath.   Skin: Positive for rash.  All other systems reviewed and are negative.    Physical Exam Updated Vital Signs BP (!) 128/51   Pulse 87   Temp 99.2 F (37.3 C) (Oral)   Resp 17   Ht 5\' 2"  (1.575 m)   Wt 63.5 kg   LMP  (LMP Unknown)   SpO2 98%   BMI 25.61 kg/m   Physical Exam  Constitutional: She is oriented to person, place, and time. She appears well-developed and well-nourished.  HENT:  Head:  Normocephalic and atraumatic.  Cardiovascular: Normal rate and regular rhythm.  No murmur heard. Pulmonary/Chest: Effort normal and breath sounds normal. No respiratory distress.  Abdominal: Soft. There is no tenderness. There is no rebound and no guarding.  Musculoskeletal: She exhibits no edema or tenderness.  Neurological: She is alert and oriented to person, place, and time.  Skin: Skin is warm and dry.  Dry, scaling skin to face as well as neck. There is no drainage, erythema or induration.  Psychiatric: She has a normal mood and affect. Her behavior is normal.  Nursing note and vitals reviewed.    ED Treatments / Results  Labs (all labs ordered are listed, but only abnormal results are displayed) Labs Reviewed  BASIC METABOLIC PANEL - Abnormal; Notable for the following components:      Result Value   Glucose, Bld 102 (*)    BUN 43 (*)    Creatinine, Ser 1.28 (*)    GFR calc non Af Amer 40 (*)    GFR calc Af Amer 47 (*)    All other components within normal limits  CBC WITH DIFFERENTIAL/PLATELET - Abnormal; Notable for the following components:   RBC 3.85 (*)    Hemoglobin 9.8 (*)    HCT 33.1 (*)    MCH 25.5 (*)    MCHC 29.6 (*)    RDW 15.7 (*)    All other components within normal limits  HEPARIN LEVEL (UNFRACTIONATED) - Abnormal; Notable for the following components:   Heparin Unfractionated <0.10 (*)    All other components within normal limits  APTT  PROTIME-INR  I-STAT TROPONIN, ED  POCT I-STAT TROPONIN I    EKG EKG Interpretation  Date/Time:  Friday January 26 2018 18:09:24 EST Ventricular Rate:  90 PR Interval:  138 QRS Duration: 88 QT Interval:  360 QTC Calculation: 440 R Axis:   58 Text Interpretation:  Normal sinus rhythm Nonspecific T wave abnormality Abnormal ECG Confirmed by Tilden Fossaees, Pearlee Arvizu [phone]([id number]) on 01/26/2018 8:17:17 PM   Radiology Dg Chest 2 View  Result Date: 01/26/2018 CLINICAL DATA:  Shortness of breath 1 week. EXAM: CHEST - 2  VIEW COMPARISON:  01/25/2016 FINDINGS: Lungs are adequately inflated without consolidation or effusion. Cardiomediastinal silhouette and remainder of the exam is unchanged. IMPRESSION: No active cardiopulmonary disease. Electronically Signed   By: Elberta Fortisaniel  Boyle M.D.   On: 01/26/2018 18:47   Ct Angio Chest Pe W/cm &/or Wo Cm  Result Date: 01/26/2018 CLINICAL DATA:  Shortness of breath EXAM: CT ANGIOGRAPHY CHEST WITH CONTRAST TECHNIQUE: Multidetector CT imaging of the chest was performed using the standard protocol during bolus administration of intravenous contrast. Multiplanar CT image reconstructions and MIPs were obtained to  evaluate the vascular anatomy. CONTRAST:  ISOVUE-370 IOPAMIDOL (ISOVUE-370) INJECTION 76% COMPARISON:  Chest x-ray 01/26/2018, CT chest 01/12/2016 FINDINGS: Cardiovascular: Satisfactory opacification of the pulmonary arteries to the segmental level. Acute pulmonary embolus within the distal right pulmonary artery with extension of thrombus into right inter lobar, and multiple right upper, middle and lower lobe segmental and subsegmental branches. Acute thrombus within the distal left pulmonary artery with extension of thrombi in to left upper and lower lobe segmental and subsegmental branches. RV LV ratio is 0.9. Borderline cardiomegaly. Small pericardial effusion, measuring up to 15 mm in thickness on the left side posteriorly. Nonaneurysmal aorta. Mild aortic atherosclerosis. Mediastinum/Nodes: Midline trachea. No thyroid mass. No significant adenopathy. Esophagus within normal limits. Lungs/Pleura: Mild emphysema. Scarring within the bilateral lung bases. No acute airspace disease or pleural effusion. Upper Abdomen: No acute abnormality. Musculoskeletal: Chronic bilateral rib deformities. Review of the MIP images confirms the above findings. IMPRESSION: 1. Acute bilateral pulmonary emboli as described above. Positive for acute PE with CT evidence of right heart strain (RV/LV  Ratio = 0.9) consistent with at least submassive (intermediate risk) PE. The presence of right heart strain has been associated with an increased risk of morbidity and mortality. Please activate Code PE by paging [phone]. 2. Mild emphysema.  No acute pulmonary infiltrates. Critical Value/emergent results were called by telephone at the time of interpretation on 01/26/2018 at 10:28 pm to Dr. Tilden Fossa , who verbally acknowledged these results. Aortic Atherosclerosis (ICD10-I70.0) and Emphysema (ICD10-J43.9). Electronically Signed   By: Jasmine Pang M.D.   On: 01/26/2018 22:28    Procedures Procedures (including critical care time) CRITICAL CARE Performed by: Tilden Fossa   Total critical care time: 35 minutes  Critical care time was exclusive of separately billable procedures and treating other patients.  Critical care was necessary to treat or prevent imminent or life-threatening deterioration.  Critical care was time spent personally by me on the following activities: development of treatment plan with patient and/or surrogate as well as nursing, discussions with consultants, evaluation of patient's response to treatment, examination of patient, obtaining history from patient or surrogate, ordering and performing treatments and interventions, ordering and review of laboratory studies, ordering and review of radiographic studies, pulse oximetry and re-evaluation of patient's condition.  Medications Ordered in ED Medications  iopamidol (ISOVUE-370) 76 % injection (has no administration in time range)  heparin ADULT infusion 100 units/mL (25000 units/284mL sodium chloride 0.45%) (950 Units/hr Intravenous New Bag/Given 01/26/18 2303)  sodium chloride 0.9 % bolus 500 mL (500 mLs Intravenous New Bag/Given 01/26/18 2234)  sodium chloride (PF) 0.9 % injection (10 mLs  Given by Other 01/26/18 2304)  iopamidol (ISOVUE-370) 76 % injection 100 mL (100 mLs Intravenous Contrast Given 01/26/18  2200)     Initial Impression / Assessment and Plan / ED Course  I have reviewed the triage vital signs and the nursing notes.  Pertinent labs & imaging results that were available during my care of the patient were reviewed by me and considered in my medical decision making (see chart for details).     Patient with history of pulmonary embolism currently on eloquence here for evaluation of one week of progressive shortness of breath and dyspnea on exertion. CT scan demonstrates bilateral pulmonary embolism's. Patient is sitting comfortably on room air but does develop tachycardia with shifting position in the stretcher. She did have some transient hypotension, improved with IV fluid hydration. She was started on heparin drip for anticoagulation. Discussed with hospitalist regarding  admission. Critical care consulted, intensivist recommends cycle troponins, check echo and re-consult if patient has decompensation.  Final Clinical Impressions(s) / ED Diagnoses   Final diagnoses:  None    ED Discharge Orders    None       Tilden Fossa, MD 01/26/18 2354

## 2018-01-27 ENCOUNTER — Observation Stay (HOSPITAL_COMMUNITY): Payer: Medicare Other

## 2018-01-27 DIAGNOSIS — E119 Type 2 diabetes mellitus without complications: Secondary | ICD-10-CM

## 2018-01-27 DIAGNOSIS — I2699 Other pulmonary embolism without acute cor pulmonale: Secondary | ICD-10-CM | POA: Diagnosis not present

## 2018-01-27 DIAGNOSIS — Z87891 Personal history of nicotine dependence: Secondary | ICD-10-CM | POA: Diagnosis not present

## 2018-01-27 DIAGNOSIS — D649 Anemia, unspecified: Secondary | ICD-10-CM

## 2018-01-27 DIAGNOSIS — I340 Nonrheumatic mitral (valve) insufficiency: Secondary | ICD-10-CM

## 2018-01-27 DIAGNOSIS — Z7982 Long term (current) use of aspirin: Secondary | ICD-10-CM | POA: Diagnosis not present

## 2018-01-27 DIAGNOSIS — N183 Chronic kidney disease, stage 3 unspecified: Secondary | ICD-10-CM

## 2018-01-27 DIAGNOSIS — Z91120 Patient's intentional underdosing of medication regimen due to financial hardship: Secondary | ICD-10-CM | POA: Diagnosis not present

## 2018-01-27 DIAGNOSIS — I248 Other forms of acute ischemic heart disease: Secondary | ICD-10-CM | POA: Diagnosis present

## 2018-01-27 DIAGNOSIS — Z66 Do not resuscitate: Secondary | ICD-10-CM | POA: Diagnosis present

## 2018-01-27 DIAGNOSIS — I10 Essential (primary) hypertension: Secondary | ICD-10-CM | POA: Diagnosis not present

## 2018-01-27 DIAGNOSIS — R21 Rash and other nonspecific skin eruption: Secondary | ICD-10-CM

## 2018-01-27 DIAGNOSIS — R609 Edema, unspecified: Secondary | ICD-10-CM

## 2018-01-27 DIAGNOSIS — I2609 Other pulmonary embolism with acute cor pulmonale: Secondary | ICD-10-CM | POA: Diagnosis present

## 2018-01-27 DIAGNOSIS — T45516A Underdosing of anticoagulants, initial encounter: Secondary | ICD-10-CM | POA: Diagnosis present

## 2018-01-27 DIAGNOSIS — E1122 Type 2 diabetes mellitus with diabetic chronic kidney disease: Secondary | ICD-10-CM | POA: Diagnosis present

## 2018-01-27 DIAGNOSIS — Z86718 Personal history of other venous thrombosis and embolism: Secondary | ICD-10-CM | POA: Diagnosis not present

## 2018-01-27 DIAGNOSIS — M109 Gout, unspecified: Secondary | ICD-10-CM | POA: Diagnosis present

## 2018-01-27 DIAGNOSIS — I129 Hypertensive chronic kidney disease with stage 1 through stage 4 chronic kidney disease, or unspecified chronic kidney disease: Secondary | ICD-10-CM | POA: Diagnosis present

## 2018-01-27 DIAGNOSIS — N179 Acute kidney failure, unspecified: Secondary | ICD-10-CM | POA: Diagnosis not present

## 2018-01-27 DIAGNOSIS — Z86711 Personal history of pulmonary embolism: Secondary | ICD-10-CM | POA: Diagnosis not present

## 2018-01-27 DIAGNOSIS — I824Z1 Acute embolism and thrombosis of unspecified deep veins of right distal lower extremity: Secondary | ICD-10-CM | POA: Diagnosis present

## 2018-01-27 DIAGNOSIS — L919 Hypertrophic disorder of the skin, unspecified: Secondary | ICD-10-CM | POA: Diagnosis present

## 2018-01-27 DIAGNOSIS — Z79899 Other long term (current) drug therapy: Secondary | ICD-10-CM | POA: Diagnosis not present

## 2018-01-27 DIAGNOSIS — L309 Dermatitis, unspecified: Secondary | ICD-10-CM | POA: Diagnosis present

## 2018-01-27 DIAGNOSIS — I252 Old myocardial infarction: Secondary | ICD-10-CM | POA: Diagnosis not present

## 2018-01-27 DIAGNOSIS — L853 Xerosis cutis: Secondary | ICD-10-CM | POA: Diagnosis present

## 2018-01-27 LAB — TROPONIN I
Troponin I: <0.03 ng/mL (ref <0.03)
Troponin I: <0.03 ng/mL (ref <0.03)
Troponin I: <0.03 ng/mL (ref <0.03)

## 2018-01-27 LAB — IRON AND TIBC
IRON: 55 ug/dL (ref 28–170)
Saturation Ratios: 19 % (ref 10.4–31.8)
TIBC: 294 ug/dL (ref 250–450)
UIBC: 239 ug/dL

## 2018-01-27 LAB — HEPARIN LEVEL (UNFRACTIONATED)
Heparin Unfractionated: 0.8 IU/mL — ABNORMAL HIGH (ref 0.30–0.70)
Heparin Unfractionated: 0.54 IU/mL (ref 0.30–0.70)

## 2018-01-27 LAB — CBC
HCT: 34.7 % — ABNORMAL LOW (ref 36.0–46.0)
Hemoglobin: 9.9 g/dL — ABNORMAL LOW (ref 12.0–15.0)
MCH: 24.9 pg — AB (ref 26.0–34.0)
MCHC: 28.5 g/dL — ABNORMAL LOW (ref 30.0–36.0)
MCV: 87.4 fL (ref 80.0–100.0)
Platelets: 252 10*3/uL (ref 150–400)
RBC: 3.97 MIL/uL (ref 3.87–5.11)
RDW: 15.7 % — ABNORMAL HIGH (ref 11.5–15.5)
WBC: 4.7 10*3/uL (ref 4.0–10.5)
nRBC: 0 % (ref 0.0–0.2)

## 2018-01-27 LAB — ECHOCARDIOGRAM COMPLETE
Height: 62 in
Weight: 2240 oz

## 2018-01-27 LAB — GLUCOSE, CAPILLARY
Glucose-Capillary: 69 mg/dL — ABNORMAL LOW (ref 70–99)
Glucose-Capillary: 102 mg/dL — ABNORMAL HIGH (ref 70–99)
Glucose-Capillary: 105 mg/dL — ABNORMAL HIGH (ref 70–99)
Glucose-Capillary: 106 mg/dL — ABNORMAL HIGH (ref 70–99)

## 2018-01-27 LAB — FERRITIN: Ferritin: 67 ng/mL (ref 11–307)

## 2018-01-27 LAB — HEMOGLOBIN A1C
Hgb A1c MFr Bld: 6.2 % — ABNORMAL HIGH (ref 4.8–5.6)
Mean Plasma Glucose: 131.24 mg/dL

## 2018-01-27 LAB — MRSA PCR SCREENING: MRSA BY PCR: NEGATIVE

## 2018-01-27 MED ORDER — INSULIN ASPART 100 UNIT/ML ~~LOC~~ SOLN: 0.0000 [IU] | Freq: Three times a day (TID) | SUBCUTANEOUS | Status: DC

## 2018-01-27 MED ORDER — ASPIRIN 81 MG PO CHEW
81.0000 mg | CHEWABLE_TABLET | Freq: Every day | ORAL | Status: DC
  Administered 2018-01-27 – 2018-01-29 (×3): 81 mg via ORAL
  Filled 2018-01-27 (×3): qty 1

## 2018-01-27 MED ORDER — ALLOPURINOL 300 MG PO TABS
300.0000 mg | ORAL_TABLET | Freq: Every day | ORAL | Status: DC
  Administered 2018-01-27 – 2018-01-29 (×3): 300 mg via ORAL
  Filled 2018-01-27: qty 3
  Filled 2018-01-27 (×2): qty 1
  Filled 2018-01-27: qty 3
  Filled 2018-01-27: qty 1

## 2018-01-27 MED ORDER — DOCUSATE SODIUM 100 MG PO CAPS
100.0000 mg | ORAL_CAPSULE | Freq: Two times a day (BID) | ORAL | Status: DC
  Administered 2018-01-27 – 2018-01-29 (×5): 100 mg via ORAL
  Filled 2018-01-27 (×5): qty 1

## 2018-01-27 MED ORDER — ENSURE ENLIVE PO LIQD
237.0000 mL | Freq: Two times a day (BID) | ORAL | Status: DC
  Administered 2018-01-27 – 2018-01-28 (×2): 237 mL via ORAL

## 2018-01-27 MED ORDER — FERROUS SULFATE 325 (65 FE) MG PO TABS
325.0000 mg | ORAL_TABLET | Freq: Two times a day (BID) | ORAL | Status: DC
  Administered 2018-01-27 – 2018-01-29 (×4): 325 mg via ORAL
  Filled 2018-01-27 (×4): qty 1

## 2018-01-27 MED ORDER — HEPARIN BOLUS VIA INFUSION
2000.0000 [IU] | Freq: Once | INTRAVENOUS | Status: AC
  Administered 2018-01-27: 2000 [IU] via INTRAVENOUS
  Filled 2018-01-27: qty 2000

## 2018-01-27 MED ORDER — HYDROCERIN EX CREA
TOPICAL_CREAM | Freq: Two times a day (BID) | CUTANEOUS | Status: DC
  Administered 2018-01-27 – 2018-01-28 (×4): via TOPICAL
  Filled 2018-01-27: qty 113

## 2018-01-27 NOTE — Progress Notes (Signed)
PROGRESS NOTE   Danae ChenMarie J Tallo  ZOX:[id number]    DOB: 10/16/1940    DOA: 01/26/2018  PCP: Dorothyann PengSanders, Robyn, MD   I have briefly reviewed patients [facility].  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  Admitted for acute bilateral submassive PE with right heart strain and acute right lower extremity DVT due to nonadherence with Eliquis.  EDP discussed with CCM.  Admitted to stepdown unit.  Initiated IV heparin drip.  Hemodynamically stable.   Assessment & Plan:   Principal Problem:   Acute pulmonary embolism (HCC) Active Problems:   Skin rash   Chronic anemia   Type 2 diabetes mellitus (HCC)   HTN (hypertension)   CKD (chronic kidney disease) stage 3, GFR 30-59 ml/min (HCC)   Acute bilateral submassive PE and acute extensive right lower extremity DVT: Recurrent.  Now precipitated by nonadherence with Eliquis.  Reportedly ran out of Eliquis approximately 3 weeks PTA because she could not afford.  However she also states that in the past, her family were assisting with finances but she did not inform them this time.  They are willing to continue to support her in getting the Eliquis.  Hemodynamically stable and not hypoxic.  EDP discussed with CCM who recommended cycling troponins, checking TTE and reconsulting if decompensates.  Admitted to stepdown and initiated IV heparin, continue for 48 hours and then transition to Eliquis.  Discussed in detail with patient regarding importance of compliance with Eliquis at all times and she verbalized understanding.  Troponins negative.  Consider discontinuing aspirin when  Eliquis initiated to minimize bleeding risk.  Dermatitis: Noted to have chronic rash on her face and neck.  Recommend outpatient dermatology consultation.  Continue emollient cream.  DM 2 with renal complications: Good control as indicated by A1c 6.2.  Diet controlled at home.  Placed on SSI here.  Hypoglycemic range CBG/69 this morning.  DC SSI and monitor CBGs alone.  Treat hypoglycemia per protocol.  Essential hypertension: Controlled.  Antihypertensives (metoprolol, HCTZ-ACEI) temporarily held in the context of acute submassive PE.  Monitor.  Normocytic anemia: Could be iron deficiency versus chronic kidney disease versus chronic disease.  Iron 55, ferritin 67.  Start iron supplements.  Stable.  Stage III chronic kidney disease: Baseline creatinine probably in the 1.1 range.  Presented with creatinine of 1.28.  Slight elevation could be due to home antihypertensives.  Follow BMP in a.m.  Gout: No flare.  Continue allopurinol.     DVT prophylaxis: Currently on IV heparin infusion. Code Status: DNR Family Communication: None at bedside. Disposition: Patient was admitted to stepdown unit for close monitoring and management given acute bilateral submassive PE and extensive right lower extremity DVT.  Plan is to treat her with approximately 48 hours of IV heparin prior to transitioning to Eliquis at discharge on 12/2.  Thereby she will have been in the hospital for greater than 2 midnights.  Hence changed to inpatient status.   Consultants:  None  Procedures:  Bilateral lower extremity venous Dopplers (preliminary result) 01/27/2018:  LE venous duplex prelim: RIGHT - - DVT involving the common femoral, femoral, PFV, popliteal,  gastroc, PTV, PEROV. Extends up to distal right iliac, but mid iliac is patent.  LEFT negative for DVT.   Antimicrobials:  None   Subjective: Ports that she feels better.  Has not been out of bed however.  Denies dyspnea, chest pain, palpitations,  dizziness or lightheadedness.  No bleeding reported.  Denies right lower extremity pain or swelling.  No acute issues reported by RN.  ROS: As above, otherwise negative.  Objective:  Vitals:   01/27/18 0300 01/27/18 0400 01/27/18 0500 01/27/18 0800  BP: (!) 136/51 (!) 134/49 (!) 140/52 (!) 120/47  Pulse: 79 73 63 72  Resp: 18 17 11 15   Temp: 98.6 F (37 C)   98 F (36.7 C)  TempSrc: Oral   Oral  SpO2: 98% 98% 98% 96%  Weight:      Height:        Examination:  General exam: Pleasant elderly female, moderately built and nourished lying comfortably propped up in bed. Respiratory system: Clear to auscultation. Respiratory effort normal. Cardiovascular system: S1 & S2 heard, RRR. No JVD, murmurs, rubs, gallops or clicks. No pedal edema.  Telemetry personally reviewed: Sinus rhythm. Gastrointestinal system: Abdomen is nondistended, soft and nontender. No organomegaly or masses felt. Normal bowel sounds heard. Central nervous system: Alert and oriented. No focal neurological deficits. Extremities: Symmetric 5 x 5 power.  Right leg may be slightly asymmetrically swollen diffusely compared to left without any other acute findings.  Neurovascularly intact. Skin: Noted xerosis and hyperpigmentation of skin of her face and neck.  No acute findings. Psychiatry: Judgement and insight appear normal. Mood & affect appropriate.     Data Reviewed: I have personally reviewed following labs and imaging studies  CBC: Recent Labs  Lab 01/26/18 2056 01/27/18 0822  WBC 5.4 4.7  NEUTROABS 3.3  --   HGB 9.8* 9.9*  HCT 33.1* 34.7*  MCV 86.0 87.4  PLT 242 252   Basic Metabolic Panel: Recent Labs  Lab 01/26/18 2056  NA 139  K 3.7  CL 103  CO2 26  GLUCOSE 102*  BUN 43*  CREATININE 1.28*  CALCIUM 9.5    Coagulation Profile: Recent Labs  Lab 01/26/18 2056  INR 1.07   Cardiac Enzymes: Recent Labs  Lab 01/27/18 0330 01/27/18 0822  TROPONINI <0.03 <0.03   HbA1C: Recent Labs     01/27/18 0330  HGBA1C 6.2*   CBG: Recent Labs  Lab 01/27/18 0724  GLUCAP 69*    Recent Results (from the past 240 hour(s))  MRSA PCR Screening     Status: None   Collection Time: 01/27/18  2:20 AM  Result Value Ref Range Status   MRSA by PCR NEGATIVE NEGATIVE Final    Comment:        The GeneXpert MRSA Assay (FDA approved for NASAL specimens only), is one component of a comprehensive MRSA colonization surveillance program. It is not intended to diagnose MRSA infection nor to guide or monitor treatment for MRSA infections. Performed at [facility], [address] [place], [place] [address]          Radiology Studies: Dg Chest 2 View  Result Date: 01/26/2018 CLINICAL DATA:  Shortness of breath 1 week. EXAM: CHEST - 2 VIEW COMPARISON:  01/25/2016 FINDINGS: Lungs are adequately inflated without consolidation or effusion. Cardiomediastinal silhouette and remainder of the exam is unchanged. IMPRESSION: No active cardiopulmonary disease. Electronically Signed   By: Elberta Fortis M.D.   On: 01/26/2018 18:47   Ct Angio Chest Pe  W/cm &/or Wo Cm  Result Date: 01/26/2018 CLINICAL DATA:  Shortness of breath EXAM: CT ANGIOGRAPHY CHEST WITH CONTRAST TECHNIQUE: Multidetector CT imaging of the chest was performed using the standard protocol during bolus administration of intravenous contrast. Multiplanar CT image reconstructions and MIPs were obtained to evaluate the vascular anatomy. CONTRAST:  ISOVUE-370 IOPAMIDOL (ISOVUE-370) INJECTION 76% COMPARISON:  Chest x-ray 01/26/2018, CT chest 01/12/2016 FINDINGS: Cardiovascular: Satisfactory opacification of the pulmonary arteries to the segmental level. Acute pulmonary embolus within the distal right pulmonary artery with extension of thrombus into right inter lobar, and multiple right upper, middle and lower lobe segmental and subsegmental branches. Acute thrombus within the distal left pulmonary artery with  extension of thrombi in to left upper and lower lobe segmental and subsegmental branches. RV LV ratio is 0.9. Borderline cardiomegaly. Small pericardial effusion, measuring up to 15 mm in thickness on the left side posteriorly. Nonaneurysmal aorta. Mild aortic atherosclerosis. Mediastinum/Nodes: Midline trachea. No thyroid mass. No significant adenopathy. Esophagus within normal limits. Lungs/Pleura: Mild emphysema. Scarring within the bilateral lung bases. No acute airspace disease or pleural effusion. Upper Abdomen: No acute abnormality. Musculoskeletal: Chronic bilateral rib deformities. Review of the MIP images confirms the above findings. IMPRESSION: 1. Acute bilateral pulmonary emboli as described above. Positive for acute PE with CT evidence of right heart strain (RV/LV Ratio = 0.9) consistent with at least submassive (intermediate risk) PE. The presence of right heart strain has been associated with an increased risk of morbidity and mortality. Please activate Code PE by paging [phone]. 2. Mild emphysema.  No acute pulmonary infiltrates. Critical Value/emergent results were called by telephone at the time of interpretation on 01/26/2018 at 10:28 pm to Dr. Tilden Fossa , who verbally acknowledged these results. Aortic Atherosclerosis (ICD10-I70.0) and Emphysema (ICD10-J43.9). Electronically Signed   By: Jasmine Pang M.D.   On: 01/26/2018 22:28        Scheduled Meds: . allopurinol  300 mg Oral Daily  . aspirin  81 mg Oral Daily  . feeding supplement (ENSURE ENLIVE)  237 mL Oral BID BM  . hydrocerin   Topical BID  . insulin aspart  0-9 Units Subcutaneous TID WC   Continuous Infusions: . heparin 850 Units/hr (01/27/18 0927)     LOS: 0 days     Marcellus Scott, MD, FACP, [facility]. [facility] Pager [phone]  If 7PM-7AM, please contact night-coverage [email or web address] Password [email or web address] 01/27/2018, 10:52 AM

## 2018-01-27 NOTE — Progress Notes (Signed)
LE venous duplex prelim: RIGHT - - DVT involving the common femoral, femoral, PFV, popliteal, gastroc, PTV, PEROV. Extends up to distal right iliac, but mid iliac is patent.  LEFT negative for DVT.      Text paged results to Dr. Waymon AmatoHongalgi.   Farrel DemarkJill Eunice, RDMS, RVT

## 2018-01-27 NOTE — ED Notes (Signed)
ED TO INPATIENT HANDOFF REPORT  Name/Age/Gender Glenda Blankenship 77 y.o. female  Code Status    Code Status Orders  (From admission, onward)         Start     Ordered   01/27/18 0053  Do not attempt resuscitation (DNR)  Continuous    Question Answer Comment  In the event of cardiac or respiratory ARREST Do not call a "code blue"   In the event of cardiac or respiratory ARREST Do not perform Intubation, CPR, defibrillation or ACLS   In the event of cardiac or respiratory ARREST Use medication by any route, position, wound care, and other measures to relive pain and suffering. May use oxygen, suction and manual treatment of airway obstruction as needed for comfort.      01/27/18 0055        Code Status History    Date Active Date Inactive Code Status Order ID Comments User Context   01/08/2016 0213 01/14/2016 1732 DNR [id number]  Duayne Cal, NP ED    Advance Directive Documentation     Most Recent Value  Type of Advance Directive  Healthcare Power of Attorney, Living will  Pre-existing out of facility DNR order (yellow form or pink MOST form)  -  "MOST" Form in Place?  -      Home/SNF/Other Home  Chief Complaint SOB / rash   Level of Care/Admitting Diagnosis ED Disposition    ED Disposition Condition Comment   Admit  Hospital Area: [facility] [[id number]]  Level of Care: Stepdown [14]  Admit to SDU based on following criteria: Hemodynamic compromise or significant risk of instability:  Patient requiring short term acute titration and management of vasoactive drips, and invasive monitoring (i.e., CVP and Arterial line).  Diagnosis: Acute pulmonary embolism [facility]) [[id number]]  Admitting Physician: John Giovanni [[id number]]  Attending Physician: John Giovanni [[id number]]  PT Class (Do Not Modify): Observation [104]  PT Acc Code (Do Not Modify): Observation [10022]       Medical History Past Medical History:  Diagnosis Date  . Diabetes (HCC)    . DVT (deep vein thrombosis) in pregnancy   . Hypertension   . Pneumonia   . Renal disorder     Allergies No Known Allergies  IV Location/Drains/Wounds Patient Lines/Drains/Airways Status   Active Line/Drains/Airways    Name:   Placement date:   Placement time:   Site:   Days:   Peripheral IV 01/26/18 Right Antecubital   01/26/18    2054    Antecubital   1   Peripheral IV 01/26/18 Left;Upper Arm   01/26/18    2254    Arm   1          Labs/Imaging Results for orders placed or performed during the hospital encounter of 01/26/18 (from the past 48 hour(s))  Basic metabolic panel     Status: Abnormal   Collection Time: 01/26/18  8:56 PM  Result Value Ref Range   Sodium 139 135 - 145 mmol/L   Potassium 3.7 3.5 - 5.1 mmol/L   Chloride 103 98 - 111 mmol/L   CO2 26 22 - 32 mmol/L   Glucose, Bld 102 (H) 70 - 99 mg/dL   BUN 43 (H) 8 - 23 mg/dL   Creatinine, Ser 4.13 (H) 0.44 - 1.00 mg/dL   Calcium 9.5 8.9 - 24.4 mg/dL   GFR calc non Af Amer 40 (L) >60 mL/min   GFR calc Af Amer 47 (L) >60 mL/min  Anion gap 10 5 - 15    Comment: Performed at [facility], [address], [place], [place] [address]  CBC with Differential     Status: Abnormal   Collection Time: 01/26/18  8:56 PM  Result Value Ref Range   WBC 5.4 4.0 - 10.5 K/uL   RBC 3.85 (L) 3.87 - 5.11 MIL/uL   Hemoglobin 9.8 (L) 12.0 - 15.0 g/dL   HCT 60.4 (L) 54.0 - 98.1 %   MCV 86.0 80.0 - 100.0 fL   MCH 25.5 (L) 26.0 - 34.0 pg   MCHC 29.6 (L) 30.0 - 36.0 g/dL   RDW 19.1 (H) 47.8 - 29.5 %   Platelets 242 150 - 400 K/uL   nRBC 0.0 0.0 - 0.2 %   Neutrophils Relative % 59 %   Neutro Abs 3.3 1.7 - 7.7 K/uL   Lymphocytes Relative 28 %   Lymphs Abs 1.5 0.7 - 4.0 K/uL   Monocytes Relative 6 %   Monocytes Absolute 0.3 0.1 - 1.0 K/uL   Eosinophils Relative 5 %   Eosinophils Absolute 0.3 0.0 - 0.5 K/uL   Basophils Relative 1 %   Basophils Absolute 0.0 0.0 - 0.1 K/uL   Immature Granulocytes 1 %   Abs  Immature Granulocytes 0.03 0.00 - 0.07 K/uL    Comment: Performed at [facility], [address], [place], [place] [address]  APTT     Status: None   Collection Time: 01/26/18  8:56 PM  Result Value Ref Range   aPTT 35 24 - 36 seconds    Comment: Performed at [facility], [address], [place], [place] [address]  Protime-INR     Status: None   Collection Time: 01/26/18  8:56 PM  Result Value Ref Range   Prothrombin Time 13.8 11.4 - 15.2 seconds   INR 1.07     Comment: Performed at [facility], [address] [place], [place] [address]  Heparin level (unfractionated)     Status: Abnormal   Collection Time: 01/26/18  8:56 PM  Result Value Ref Range   Heparin Unfractionated <0.10 (L) 0.30 - 0.70 IU/mL    Comment: (NOTE) If heparin results are below expected values, and patient dosage has  been confirmed, suggest follow up testing of antithrombin III levels. Performed at [facility], [address], [place], [place] [address]   POCT i-Stat troponin I     Status: None   Collection Time: 01/26/18  9:05 PM  Result Value Ref Range   Troponin i, poc 0.00 0.00 - 0.08 ng/mL   Comment 3            Comment: Due to the release kinetics of cTnI, a negative result within the first hours of the onset of symptoms does not rule out myocardial infarction with certainty. If myocardial infarction is still suspected, repeat the test at appropriate intervals.    Dg Chest 2 View  Result Date: 01/26/2018 CLINICAL DATA:  Shortness of breath 1 week. EXAM: CHEST - 2 VIEW COMPARISON:  01/25/2016 FINDINGS: Lungs are adequately inflated without consolidation or effusion. Cardiomediastinal silhouette and remainder of the exam is unchanged. IMPRESSION: No active cardiopulmonary disease. Electronically Signed   By: Elberta Fortis M.D.   On: 01/26/2018 18:47   Ct Angio Chest Pe W/cm &/or Wo Cm  Result Date: 01/26/2018 CLINICAL  DATA:  Shortness of breath EXAM: CT ANGIOGRAPHY CHEST WITH CONTRAST TECHNIQUE: Multidetector CT imaging of the  chest was performed using the standard protocol during bolus administration of intravenous contrast. Multiplanar CT image reconstructions and MIPs were obtained to evaluate the vascular anatomy. CONTRAST:  ISOVUE-370 IOPAMIDOL (ISOVUE-370) INJECTION 76% COMPARISON:  Chest x-ray 01/26/2018, CT chest 01/12/2016 FINDINGS: Cardiovascular: Satisfactory opacification of the pulmonary arteries to the segmental level. Acute pulmonary embolus within the distal right pulmonary artery with extension of thrombus into right inter lobar, and multiple right upper, middle and lower lobe segmental and subsegmental branches. Acute thrombus within the distal left pulmonary artery with extension of thrombi in to left upper and lower lobe segmental and subsegmental branches. RV LV ratio is 0.9. Borderline cardiomegaly. Small pericardial effusion, measuring up to 15 mm in thickness on the left side posteriorly. Nonaneurysmal aorta. Mild aortic atherosclerosis. Mediastinum/Nodes: Midline trachea. No thyroid mass. No significant adenopathy. Esophagus within normal limits. Lungs/Pleura: Mild emphysema. Scarring within the bilateral lung bases. No acute airspace disease or pleural effusion. Upper Abdomen: No acute abnormality. Musculoskeletal: Chronic bilateral rib deformities. Review of the MIP images confirms the above findings. IMPRESSION: 1. Acute bilateral pulmonary emboli as described above. Positive for acute PE with CT evidence of right heart strain (RV/LV Ratio = 0.9) consistent with at least submassive (intermediate risk) PE. The presence of right heart strain has been associated with an increased risk of morbidity and mortality. Please activate Code PE by paging [phone]. 2. Mild emphysema.  No acute pulmonary infiltrates. Critical Value/emergent results were called by telephone at the time of interpretation on  01/26/2018 at 10:28 pm to Dr. Tilden Fossa , who verbally acknowledged these results. Aortic Atherosclerosis (ICD10-I70.0) and Emphysema (ICD10-J43.9). Electronically Signed   By: Jasmine Pang M.D.   On: 01/26/2018 22:28   EKG Interpretation  Date/Time:  Friday January 26 2018 18:09:24 EST Ventricular Rate:  90 PR Interval:  138 QRS Duration: 88 QT Interval:  360 QTC Calculation: 440 R Axis:   58 Text Interpretation:  Normal sinus rhythm Nonspecific T wave abnormality Abnormal ECG Confirmed by Tilden Fossa [phone]) on 01/26/2018 8:17:17 PM   Pending Labs Unresulted Labs (From admission, onward)    Start     Ordered   01/27/18 0800  Heparin level (unfractionated)  Once-Timed,   R     01/27/18 0036   01/27/18 0800  CBC  Daily,   R     01/27/18 0036   01/27/18 0500  Iron and TIBC  Tomorrow morning,   R     01/27/18 0055   01/27/18 0500  Ferritin  Tomorrow morning,   R     01/27/18 0055   01/27/18 0119  Hemoglobin A1c  Once,   R     01/27/18 0118   01/27/18 0054  Troponin I - Now Then Q6H  Now then every 6 hours,   R     01/27/18 0055          Vitals/Pain Today's Vitals   01/26/18 2330 01/27/18 0000 01/27/18 0030 01/27/18 0100  BP: (!) 128/51 129/60 (!) 135/57 129/64  Pulse: 87 89 85 87  Resp: 17 14 (!) 22 17  Temp:      TempSrc:      SpO2: 98% 100% 100% 96%  Weight:      Height:      PainSc:        Isolation Precautions No active isolations  Medications Medications  iopamidol (ISOVUE-370) 76 % injection (has no administration in time range)  heparin ADULT infusion 100 units/mL (25000 units/263mL sodium chloride 0.45%) (950  Units/hr Intravenous Rate/Dose Verify 01/27/18 0108)  allopurinol (ZYLOPRIM) tablet 300 mg (has no administration in time range)  aspirin chewable tablet 81 mg (has no administration in time range)  insulin aspart (novoLOG) injection 0-9 Units (has no administration in time range)  hydrocerin (EUCERIN) cream (has no administration in  time range)  sodium chloride 0.9 % bolus 500 mL (0 mLs Intravenous Stopped 01/26/18 2345)  sodium chloride (PF) 0.9 % injection (10 mLs  Given by Other 01/26/18 2304)  iopamidol (ISOVUE-370) 76 % injection 100 mL (100 mLs Intravenous Contrast Given 01/26/18 2200)  heparin bolus via infusion 2,000 Units (2,000 Units Intravenous Bolus from Bag 01/27/18 0025)    Mobility walks

## 2018-01-27 NOTE — Progress Notes (Signed)
  Echocardiogram 2D Echocardiogram has been performed.  Glenda Blankenship, Glenda Blankenship 01/27/2018, 8:05 AM

## 2018-01-27 NOTE — H&P (Signed)
History and Physical    STATIA BURDICK UEA:[id number] DOB: Nov 15, 1940 DOA: 01/26/2018  PCP: Dorothyann Peng, MD Patient coming from: Shortness of breath  Chief Complaint: Shortness of breath, rash  HPI: Glenda Blankenship is a 77 y.o. 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  Patient reports having dyspnea on exertion for the past 1 week.  Denies having any chest pain, wheezing, or cough.  States she ran out of Eliquis several weeks ago.  She is not able to afford Eliquis as it costs $90 for a 38-month supply.  Family at bedside states patient complained of her right leg aching 1.5 weeks ago.  Patient does not recall having any edema or erythema of that leg.  Denies having any melena or hematochezia.  Reports having a skin rash on her face and left arm for the past few months.  She has not seen a dermatologist.  States the skin feels very dry and tight.  Family states patient has been treated with antibiotics twice for her skin rash with no improvement.  She has never had a skin biopsy.  ED Course: On arrival, afebrile, blood pressure soft (111/53), not tachycardic, not tachypneic, and satting well on room air.  However, noted to be tachycardic with exertion.  Blood pressure improved after patient received a 500 cc IV fluid bolus.  No leukocytosis.  I-STAT troponin negative.  EKG with Q waves and T wave inversions in lead III.  Chest x-ray showing no active cardiopulmonary disease.  Chest CT angiogram showing acute submassive bilateral PE with evidence of right heart strain.   Review of Systems: As per HPI otherwise 10 point review of systems negative.  Past Medical History:  Diagnosis Date  . Diabetes (HCC)   . DVT (deep vein thrombosis) in pregnancy   . Hypertension   . Pneumonia   . Renal disorder     Past Surgical History:  Procedure  Laterality Date  . kidney stone removal       reports that she has quit smoking. Her smoking use included cigarettes. She has never used smokeless tobacco. She reports that she does not drink alcohol or use drugs.  No Known Allergies  History reviewed. No pertinent family history.  Prior to Admission medications   Medication Sig Start Date End Date Taking? Authorizing Provider  allopurinol (ZYLOPRIM) 300 MG tablet Take 300 mg by mouth daily.   Yes [provider]  apixaban (ELIQUIS) 5 MG TABS tablet Take 1 tablet (5 mg total) by mouth 2 (two) times daily. 04/29/16  Yes Parrett, Virgel Bouquet, NP  aspirin 81 MG chewable tablet Chew 81 mg by mouth daily.   Yes [provider]  lisinopril-hydrochlorothiazide (PRINZIDE,ZESTORETIC) 20-25 MG tablet Take 1 tablet by mouth daily. 10/28/17  Yes [provider]  potassium citrate (UROCIT-K) 10 MEQ (1080 MG) SR tablet Take 1 tablet by mouth 3 (three) times daily. 08/01/16  Yes [provider]  ELIQUIS 5 MG TABS tablet TAKE 1 TABLET BY MOUTH TWICE DAILY Patient not taking: Reported on 01/26/2018 11/01/17   Coralyn Helling, MD  metoprolol tartrate (LOPRESSOR) 25 MG tablet Take 1 tablet (25 mg total) by mouth 2 (two) times daily. Patient not taking: Reported on 01/26/2018 01/14/16   MinorVilinda Blanks, NP    Physical Exam: Vitals:   01/26/18 2330 01/27/18 0000 01/27/18 0030 01/27/18 0100  BP: (!) 128/51 129/60 (!) 135/57 129/64  Pulse: 87 89 85 87  Resp: 17 14 (!) 22 17  Temp:      TempSrc:      SpO2: 98% 100% 100% 96%  Weight:      Height:        Physical Exam  Constitutional: She is oriented to person, place, and time. She appears well-developed and well-nourished. No distress.  HENT:  Head: Normocephalic.  Mouth/Throat: Oropharynx is clear and moist.  Eyes: Right eye exhibits no discharge. Left eye exhibits no discharge.  Neck: Neck supple.  Cardiovascular: Normal rate, regular rhythm and intact distal pulses.    Pulmonary/Chest: Effort normal. No respiratory distress. She has no wheezes.  Speaking clearly in full sentences No increased work of breathing No accessory muscle use  Abdominal: Soft. Bowel sounds are normal. She exhibits no distension. There is no tenderness.  Musculoskeletal: She exhibits no edema.  Bilateral calves appear symmetric in size.  No erythema, increased warmth, or edema.  Neurological: She is alert and oriented to person, place, and time.  Skin: Skin is warm and dry. She is not diaphoretic.  Xerosis and skin hyperpigmentation noted on face, neck, and left forearm.  Psychiatric: She has a normal mood and affect. Her behavior is normal.     Labs on Admission: I have personally reviewed following labs and imaging studies  CBC: Recent Labs  Lab 01/26/18 2056  WBC 5.4  NEUTROABS 3.3  HGB 9.8*  HCT 33.1*  MCV 86.0  PLT 242   Basic Metabolic Panel: Recent Labs  Lab 01/26/18 2056  NA 139  K 3.7  CL 103  CO2 26  GLUCOSE 102*  BUN 43*  CREATININE 1.28*  CALCIUM 9.5   GFR: Estimated Creatinine Clearance: 32.2 mL/min (A) (by C-G formula based on SCr of 1.28 mg/dL (H)). Liver Function Tests: No results for input(s): AST, ALT, ALKPHOS, BILITOT, PROT, ALBUMIN in the last 168 hours. No results for input(s): LIPASE, AMYLASE in the last 168 hours. No results for input(s): AMMONIA in the last 168 hours. Coagulation Profile: Recent Labs  Lab 01/26/18 2056  INR 1.07   Cardiac Enzymes: No results for input(s): CKTOTAL, CKMB, CKMBINDEX, TROPONINI in the last 168 hours. BNP (last 3 results) No results for input(s): PROBNP in the last 8760 hours. HbA1C: No results for input(s): HGBA1C in the last 72 hours. CBG: No results for input(s): GLUCAP in the last 168 hours. Lipid Profile: No results for input(s): CHOL, HDL, LDLCALC, TRIG, CHOLHDL, LDLDIRECT in the last 72 hours. Thyroid Function Tests: No results for input(s): TSH, T4TOTAL, FREET4, T3FREE, THYROIDAB in  the last 72 hours. Anemia Panel: No results for input(s): VITAMINB12, FOLATE, FERRITIN, TIBC, IRON, RETICCTPCT in the last 72 hours. Urine analysis: No results found for: COLORURINE, APPEARANCEUR, LABSPEC, PHURINE, GLUCOSEU, HGBUR, BILIRUBINUR, KETONESUR, PROTEINUR, UROBILINOGEN, NITRITE, LEUKOCYTESUR  Radiological Exams on Admission: Dg Chest 2 View  Result Date: 01/26/2018 CLINICAL DATA:  Shortness of breath 1 week. EXAM: CHEST - 2 VIEW COMPARISON:  01/25/2016 FINDINGS: Lungs are adequately inflated without consolidation or effusion. Cardiomediastinal silhouette and remainder of the exam is unchanged. IMPRESSION: No active cardiopulmonary disease. Electronically Signed   By: Elberta Fortisaniel  Boyle M.D.   On: 01/26/2018 18:47   Ct Angio Chest Pe W/cm &/or Wo Cm  Result Date: 01/26/2018 CLINICAL DATA:  Shortness of breath EXAM: CT ANGIOGRAPHY CHEST WITH CONTRAST TECHNIQUE: Multidetector CT imaging of the chest was performed using the standard protocol during bolus administration of intravenous  contrast. Multiplanar CT image reconstructions and MIPs were obtained to evaluate the vascular anatomy. CONTRAST:  ISOVUE-370 IOPAMIDOL (ISOVUE-370) INJECTION 76% COMPARISON:  Chest x-ray 01/26/2018, CT chest 01/12/2016 FINDINGS: Cardiovascular: Satisfactory opacification of the pulmonary arteries to the segmental level. Acute pulmonary embolus within the distal right pulmonary artery with extension of thrombus into right inter lobar, and multiple right upper, middle and lower lobe segmental and subsegmental branches. Acute thrombus within the distal left pulmonary artery with extension of thrombi in to left upper and lower lobe segmental and subsegmental branches. RV LV ratio is 0.9. Borderline cardiomegaly. Small pericardial effusion, measuring up to 15 mm in thickness on the left side posteriorly. Nonaneurysmal aorta. Mild aortic atherosclerosis. Mediastinum/Nodes: Midline trachea. No thyroid mass. No  significant adenopathy. Esophagus within normal limits. Lungs/Pleura: Mild emphysema. Scarring within the bilateral lung bases. No acute airspace disease or pleural effusion. Upper Abdomen: No acute abnormality. Musculoskeletal: Chronic bilateral rib deformities. Review of the MIP images confirms the above findings. IMPRESSION: 1. Acute bilateral pulmonary emboli as described above. Positive for acute PE with CT evidence of right heart strain (RV/LV Ratio = 0.9) consistent with at least submassive (intermediate risk) PE. The presence of right heart strain has been associated with an increased risk of morbidity and mortality. Please activate Code PE by paging [phone]. 2. Mild emphysema.  No acute pulmonary infiltrates. Critical Value/emergent results were called by telephone at the time of interpretation on 01/26/2018 at 10:28 pm to Dr. Tilden Fossa , who verbally acknowledged these results. Aortic Atherosclerosis (ICD10-I70.0) and Emphysema (ICD10-J43.9). Electronically Signed   By: Jasmine Pang M.D.   On: 01/26/2018 22:28    EKG: Independently reviewed.  Q waves and T wave inversion in lead III.  Assessment/Plan Principal Problem:   Acute pulmonary embolism (HCC) Active Problems:   Skin rash   Chronic anemia   Type 2 diabetes mellitus (HCC)   HTN (hypertension)   CKD (chronic kidney disease) stage 3, GFR 30-59 ml/min (HCC)   Acute submassive pulmonary embolism with evidence of right heart strain -In the setting of Eliquis nonadherence.  Patient is presenting with 1 week history of dyspnea on exertion.  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  She has not been able to afford Eliquis and stopped taking the medication several weeks ago.  Unfractionated heparin level undetectable (<0.10) consistent with medication nonadherence. -On arrival, blood pressure soft (111/53).  Blood pressure currently improved status post 500 cc IV fluid bolus  in the ED.  Most recent read 129/64.  Noted to be tachycardic with exertion in the ED.  Breathing comfortably on room air and not hypoxic.  No increased work of breathing. -Chest CT angiogram showing acute submassive bilateral PE with evidence of right heart strain. EKG with Q waves and T wave inversions in lead III consistent with right heart strain. I-STAT troponin negative. -ED provider discussed the case with critical care who recommended cycling troponins, checking echo, and reconsulting if patient has decompensation. -Continue IV heparin -Echocardiogram -Bilateral lower extremity Dopplers -Continue to trend troponin -Monitor in the stepdown unit.  Monitor hemodynamics. -Case management consult to help patient with medications  Skin rash Noted to have xerosis and hyperpigmentation of skin on her face, neck, and left forearm.  No signs of infection. -Emollient cream -Patient will need outpatient dermatology follow-up for skin biopsy.   Chronic anemia MCV 86.  Hemoglobin 9.8, was 11.5 two months ago.  Baseline 10.5-11.7  a year ago.  Patient denies having any melena or hematochezia. -Check iron, ferritin, TIBC  Type 2 diabetes Currently not on home medications. -Check A1c -Sliding scale insulin sensitive -CBG checks  Hypertension -Hold home antihypertensives in the setting of acute PE with soft blood pressure reading on admission  CKD 3 -Stable.  Creatinine 1.2, was 1.1 in September 2019.  DVT prophylaxis: Heparin Code Status: Patient wishes to be DNR. Family Communication: Granddaughter at bedside. Disposition Plan: Anticipate discharge to home in 1 to 2 days. Consults called: Critical care Admission status: Observation   John Giovanni MD [facility] Pager [phone]  If 7PM-7AM, please contact night-coverage [email or web address] Password [email or web address]  01/27/2018, 1:29 AM

## 2018-01-27 NOTE — Progress Notes (Signed)
ANTICOAGULATION CONSULT NOTE - Follow-up Consult  Pharmacy Consult for IV heparin Indication: pulmonary embolus  No Known Allergies  Patient Measurements: Height: 5\' 2"  (157.5 cm) Weight: 140 lb (63.5 kg) IBW/kg (Calculated) : 50.1 Heparin Dosing Weight: 54 kg  Vital Signs: Temp: 98.2 F (36.8 C) (11/30 1200) Temp Source: Oral (11/30 1200) BP: 126/42 (11/30 1600) Pulse Rate: 82 (11/30 1600)  Labs: Recent Labs    01/26/18 2056 01/27/18 0330 01/27/18 0822 01/27/18 1303  HGB 9.8*  --  9.9*  --   HCT 33.1*  --  34.7*  --   PLT 242  --  252  --   APTT 35  --   --   --   LABPROT 13.8  --   --   --   INR 1.07  --   --   --   HEPARINUNFRC <0.10*  --  0.80*  --   CREATININE 1.28*  --   --   --   TROPONINI  --  <0.03 <0.03 <0.03    Estimated Creatinine Clearance: 32.2 mL/min (A) (by C-G formula based on SCr of 1.28 mg/dL (H)).   Medical History: Past Medical History:  Diagnosis Date  . Diabetes (HCC)   . DVT (deep vein thrombosis) in pregnancy   . Hypertension   . Pneumonia   . Renal disorder     Infusions:  . heparin 850 Units/hr (01/27/18 [id number])    Assessment: 77 yoF c/o SOB and rash on PTA apixaban for hx of PE. CT reveals acute bilateral PE with evidence of right heart strain.  Last dose apixaban 11/29 0900 but baseline hepairn level undetectable so ?compliance. Pharmacy consulted to dose heparin.  Heparin level 0.54, therapeutic on heparin gtt at 850 units/hr.   No bleeding or complications noted.   Goal of Therapy:  Heparin level 0.3-0.7 units/ml  Monitor platelets by anticoagulation protocol: Yes   Plan: Continue heparin drip to 850 units/hr  Will f/u 8hr heparin level to confirm therapeutic rate  Daily HL, CBC when stable  Lynann Beaverhristine Shelden Raborn PharmD, BCPS Pager [phone] 01/27/2018 5:36 PM

## 2018-01-27 NOTE — Progress Notes (Signed)
ANTICOAGULATION CONSULT NOTE - Follow-up Consult  Pharmacy Consult for IV heparin Indication: pulmonary embolus  No Known Allergies  Patient Measurements: Height: 5\' 2"  (157.5 cm) Weight: 140 lb (63.5 kg) IBW/kg (Calculated) : 50.1 Heparin Dosing Weight: 54 kg  Vital Signs: Temp: 98 F (36.7 C) (11/30 0800) Temp Source: Oral (11/30 0800) BP: 140/52 (11/30 0500) Pulse Rate: 63 (11/30 0500)  Labs: Recent Labs    01/26/18 2056 01/27/18 0330 01/27/18 0822  HGB 9.8*  --  9.9*  HCT 33.1*  --  34.7*  PLT 242  --  252  APTT 35  --   --   LABPROT 13.8  --   --   INR 1.07  --   --   HEPARINUNFRC <0.10*  --  0.80*  CREATININE 1.28*  --   --   TROPONINI  --  <0.03  --     Estimated Creatinine Clearance: 32.2 mL/min (A) (by C-G formula based on SCr of 1.28 mg/dL (H)).   Medical History: Past Medical History:  Diagnosis Date  . Diabetes (HCC)   . DVT (deep vein thrombosis) in pregnancy   . Hypertension   . Pneumonia   . Renal disorder     Medications:  Scheduled:  . allopurinol  300 mg Oral Daily  . aspirin  81 mg Oral Daily  . feeding supplement (ENSURE ENLIVE)  237 mL Oral BID BM  . hydrocerin   Topical BID  . insulin aspart  0-9 Units Subcutaneous TID WC  . iopamidol       Infusions:  . heparin 950 Units/hr (01/27/18 0108)    Assessment: 77 yoF c/o SOB and rash on PTA apixaban for hx of PE. CT reveals acute bilateral PE with evidence of right heart strain.  Last dose apixaban 11/29 0900 but baseline hepairn level undetectable so ?compliance. Pharmacy consulted to dose heparin.  Heparin level supratherapeutic (0.8) on gtt at 950 units/hr. Hgb low but stable. No bleeding noted.  Goal of Therapy:  Heparin level 0.3-0.7 units/ml  Monitor platelets by anticoagulation protocol: Yes   Plan: Decrease heparin drip to 850 units/hr  Will f/u 8hr heparin level Daily HL/CBC  Christoper Fabianaron Jahnay Lantier, PharmD, BCPS Clinical pharmacist 01/27/2018,9:19 AM

## 2018-01-28 LAB — CBC
HCT: 31.9 % — ABNORMAL LOW (ref 36.0–46.0)
Hemoglobin: 9.3 g/dL — ABNORMAL LOW (ref 12.0–15.0)
MCH: 24.6 pg — ABNORMAL LOW (ref 26.0–34.0)
MCHC: 29.2 g/dL — ABNORMAL LOW (ref 30.0–36.0)
MCV: 84.4 fL (ref 80.0–100.0)
Platelets: 253 10*3/uL (ref 150–400)
RBC: 3.78 MIL/uL — AB (ref 3.87–5.11)
RDW: 15.6 % — ABNORMAL HIGH (ref 11.5–15.5)
WBC: 5.1 10*3/uL (ref 4.0–10.5)
nRBC: 0 % (ref 0.0–0.2)

## 2018-01-28 LAB — GLUCOSE, CAPILLARY
Glucose-Capillary: 73 mg/dL (ref 70–99)
Glucose-Capillary: 90 mg/dL (ref 70–99)
Glucose-Capillary: 103 mg/dL — ABNORMAL HIGH (ref 70–99)
Glucose-Capillary: 75 mg/dL (ref 70–99)

## 2018-01-28 LAB — BASIC METABOLIC PANEL
ANION GAP: 7 (ref 5–15)
BUN: 32 mg/dL — ABNORMAL HIGH (ref 8–23)
CO2: 28 mmol/L (ref 22–32)
Calcium: 9.2 mg/dL (ref 8.9–10.3)
Chloride: 101 mmol/L (ref 98–111)
Creatinine, Ser: 0.94 mg/dL (ref 0.44–1.00)
GFR calc Af Amer: >60 mL/min (ref >60)
GFR calc non Af Amer: 59 mL/min — ABNORMAL LOW (ref >60)
Glucose, Bld: 94 mg/dL (ref 70–99)
Potassium: 4.4 mmol/L (ref 3.5–5.1)
Sodium: 136 mmol/L (ref 135–145)

## 2018-01-28 LAB — HEPARIN LEVEL (UNFRACTIONATED)
Heparin Unfractionated: 0.48 IU/mL (ref 0.30–0.70)
Heparin Unfractionated: 0.44 IU/mL (ref 0.30–0.70)

## 2018-01-28 MED ORDER — SODIUM CHLORIDE 0.45 % IV SOLN
INTRAVENOUS | Status: DC
  Administered 2018-01-28: 23:00:00 via INTRAVENOUS

## 2018-01-28 MED ORDER — ACETAMINOPHEN 325 MG PO TABS
650.0000 mg | ORAL_TABLET | Freq: Four times a day (QID) | ORAL | Status: DC | PRN
  Administered 2018-01-28: 650 mg via ORAL
  Filled 2018-01-28: qty 2

## 2018-01-28 NOTE — Progress Notes (Signed)
Nutrition Brief Note  Patient identified on the Malnutrition Screening Tool (MST) Report  Patient with weight loss but is insignificant for time frame. Pt with controlled DM. Ensure has been ordered via ONS. Pt admitted after noncompliance with medication.  Wt Readings from Last 15 Encounters:  01/26/18 63.5 kg  12/15/16 70.3 kg  08/15/16 71 kg  04/29/16 70.5 kg  04/28/16 69.9 kg  04/27/16 72.1 kg  01/26/16 72.2 kg  01/25/16 70.4 kg  01/14/16 74.1 kg    Body mass index is 25.61 kg/m. Patient meets criteria for overweight based on current BMI.   Current diet order is heart healthy/CHO modified. Labs and medications reviewed.   No nutrition interventions warranted at this time. If nutrition issues arise, please consult RD.   Glenda FrancoLindsey Davelle Anselmi, MS, RD, LDN [facility] Clinical Dietitian Pager: [phone] After Hours Pager: [phone]

## 2018-01-28 NOTE — Progress Notes (Signed)
PROGRESS NOTE   Glenda Blankenship  UJW:[id number]    DOB: 07-03-1940    DOA: 01/26/2018  PCP: Dorothyann Peng, MD   I have briefly reviewed patients previous medical records in [facility].  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  Admitted for acute bilateral submassive PE with right heart strain and acute right lower extremity DVT due to nonadherence with Eliquis.  EDP discussed with CCM.  Admitted to stepdown unit.  Initiated IV heparin drip.  Hemodynamically stable.   Assessment & Plan:   Principal Problem:   Acute pulmonary embolism (HCC) Active Problems:   Skin rash   Chronic anemia   Type 2 diabetes mellitus (HCC)   HTN (hypertension)   CKD (chronic kidney disease) stage 3, GFR 30-59 ml/min (HCC)   Acute bilateral submassive PE and acute extensive right lower extremity DVT: Recurrent.  Now precipitated by nonadherence with Eliquis.  Reportedly ran out of Eliquis approximately 3 weeks PTA because she could not afford.  However she also states that in the past, her family were assisting with finances but she did not inform them this time.  They are willing to continue to support her in getting the Eliquis.  Hemodynamically stable and not hypoxic.  EDP discussed with CCM who recommended cycling troponins, checking TTE and reconsulting if decompensates.  Admitted to stepdown and initiated IV heparin, continue for 48 hours and then transition to Eliquis.  Discussed in detail with patient regarding importance of compliance with Eliquis at all times and she verbalized understanding.  Troponins negative.  Consider discontinuing aspirin when  Eliquis initiated to minimize bleeding risk.  TTE unremarkable.  Remains hemodynamically stable and not hypoxic.  Transferred to telemetry, continue IV heparin infusion overnight and transition to Eliquis at discharge in a.m.  Dermatitis: Noted to have chronic rash on her face and neck.  Recommend outpatient dermatology consultation.  Continue emollient cream.  DM 2 with renal complications: Good control as indicated by A1c 6.2.  Diet controlled at home.  Placed on SSI here.  Hypoglycemic range CBG/69 on 11/30 morning.  DC SSI and monitor CBGs alone.  Treat hypoglycemia per protocol.  No further hypoglycemic episodes.  Good control.  Essential hypertension: Controlled.  Antihypertensives (metoprolol-was not taking, HCTZ-ACEI) temporarily held in the context of acute submassive PE.  Monitor.  No change in plan.  Normocytic anemia: Could be iron deficiency versus chronic kidney disease versus chronic disease.  Iron 55, ferritin 67.  Started iron supplements.  Stable.  Acute kidney injury on stage III chronic kidney disease: Baseline creatinine probably in the 1.1 range.  Presented with creatinine of 1.28.  Slight elevation could be due to home antihypertensives.  Creatinine has normalized.  Gout: No flare.  Continue allopurinol.     DVT prophylaxis: Currently on IV heparin infusion. Code Status: DNR Family Communication: None at bedside. Disposition: Patient was admitted to stepdown unit for close monitoring and management given acute bilateral submassive PE and extensive right lower extremity DVT.  Plan is to treat her with approximately 48 hours of IV heparin prior to transitioning to Eliquis at discharge on 12/2.  Transferred to telemetry 12/1.  Possible DC home 12/2.   Consultants:  None  Procedures:  Bilateral lower extremity venous Dopplers (preliminary result) 01/27/2018:  LE venous duplex prelim: RIGHT - - DVT involving the common femoral, femoral, PFV, popliteal, gastroc, PTV,  PEROV. Extends up to distal right iliac, but mid iliac is patent.  LEFT negative for DVT.  TTE 01/27/2018:  Study Conclusions  - Left ventricle: The cavity size was normal. There was mild focal   basal hypertrophy of the septum. Systolic function was normal.   The estimated ejection fraction was in the range of 60% to 65%.   Wall motion was normal; there were no regional wall motion   abnormalities. - Aortic valve: There was no regurgitation. - Aortic root: The aortic root was normal in size. - Mitral valve: There was mild regurgitation. - Left atrium: The atrium was normal in size. - Right ventricle: The cavity size was mildly dilated. Wall   thickness was normal. Systolic function was normal. - Right atrium: The atrium was normal in size. - Pulmonic valve: There was no regurgitation. - Pulmonary arteries: Systolic pressure was mildly increased. PA   peak pressure: 39 mm Hg (S). - Inferior vena cava: The vessel was normal in size. - Pericardium, extracardiac: A trivial pericardial effusion was   identified posterior to the heart.  Impressions:  - There is no significant change since the prior study on   04/27/2016.  Antimicrobials:  None   Subjective: Denies complaints.  No chest pain, dyspnea, palpitations, dizziness or lightheadedness.  No right lower extremity pain or swelling reported.  As per RN, no acute issues noted.  ROS: As above, otherwise negative.  Objective:  Vitals:   01/28/18 0629 01/28/18 0800 01/28/18 0900 01/28/18 1000  BP: (!) 108/53  95/64 (!) 126/40  Pulse: 84  77 77  Resp: 17  18 19   Temp:  98.4 F (36.9 C)    TempSrc:  Oral    SpO2: 97%  99% 100%  Weight:      Height:        Examination:  General exam: Pleasant elderly female, moderately built and nourished lying comfortably propped up in bed. Respiratory system: Clear to auscultation. Respiratory effort normal.  Stable Cardiovascular system: S1 & S2 heard, RRR. No JVD, murmurs,  rubs, gallops or clicks. No pedal edema.  Telemetry personally reviewed: Sinus rhythm. Gastrointestinal system: Abdomen is nondistended, soft and nontender. No organomegaly or masses felt. Normal bowel sounds heard. Central nervous system: Alert and oriented. No focal neurological deficits. Extremities: Symmetric 5 x 5 power.  Right leg may be slightly asymmetrically swollen diffusely compared to left without any other acute findings.  Neurovascularly intact.  Stable without change. Skin: Noted xerosis and hyperpigmentation of skin of her face and neck.  No acute findings. Psychiatry: Judgement and insight appear normal. Mood & affect appropriate.     Data Reviewed: I have personally reviewed following labs and imaging studies  CBC: Recent Labs  Lab 01/26/18 2056 01/27/18 0822 01/28/18 0312  WBC 5.4 4.7 5.1  NEUTROABS 3.3  --   --   HGB 9.8* 9.9* 9.3*  HCT 33.1* 34.7* 31.9*  MCV 86.0 87.4 84.4  PLT 242 252 253   Basic Metabolic Panel: Recent Labs  Lab 01/26/18 2056 01/28/18 0312  NA 139 136  K 3.7 4.4  CL 103 101  CO2 26 28  GLUCOSE 102* 94  BUN 43* 32*  CREATININE 1.28* 0.94  CALCIUM 9.5 9.2    Coagulation Profile: Recent Labs  Lab 01/26/18 2056  INR 1.07   Cardiac Enzymes: Recent Labs  Lab 01/27/18 0330 01/27/18 0822 01/27/18 1303  TROPONINI <0.03 <0.03 <0.03   HbA1C: Recent Labs    01/27/18 0330  HGBA1C 6.2*   CBG: Recent Labs  Lab 01/27/18 1131 01/27/18 1524 01/27/18 2126 01/28/18 0820 01/28/18 1144  GLUCAP 102* 105* 106* 73 90    Recent Results (from the past 240 hour(s))  MRSA PCR Screening     Status: None   Collection Time: 01/27/18  2:20 AM  Result Value Ref Range Status   MRSA by PCR NEGATIVE NEGATIVE Final    Comment:        The GeneXpert MRSA Assay (FDA approved for NASAL specimens only), is one component of a comprehensive MRSA colonization surveillance program. It is not intended to diagnose MRSA infection nor to guide  or monitor treatment for MRSA infections. Performed at [facility], [address], [place], [place] [address]          Radiology Studies: Dg Chest 2 View  Result Date: 01/26/2018 CLINICAL DATA:  Shortness of breath 1 week. EXAM: CHEST - 2 VIEW COMPARISON:  01/25/2016 FINDINGS: Lungs are adequately inflated without consolidation or effusion. Cardiomediastinal silhouette and remainder of the exam is unchanged. IMPRESSION: No active cardiopulmonary disease. Electronically Signed   By: Elberta Fortis M.D.   On: 01/26/2018 18:47   Ct Angio Chest Pe W/cm &/or Wo Cm  Result Date: 01/26/2018 CLINICAL DATA:  Shortness of breath EXAM: CT ANGIOGRAPHY CHEST WITH CONTRAST TECHNIQUE: Multidetector CT imaging of the chest was performed using the standard protocol during bolus administration of intravenous contrast. Multiplanar CT image reconstructions and MIPs were obtained to evaluate the vascular anatomy. CONTRAST:  ISOVUE-370 IOPAMIDOL (ISOVUE-370) INJECTION 76% COMPARISON:  Chest x-ray 01/26/2018, CT chest 01/12/2016 FINDINGS: Cardiovascular: Satisfactory opacification of the pulmonary arteries to the segmental level. Acute pulmonary embolus within the distal right pulmonary artery with extension of thrombus into right inter lobar, and multiple right upper, middle and lower lobe segmental and subsegmental branches. Acute thrombus within the distal left pulmonary artery with extension of thrombi in to left upper and lower lobe segmental and subsegmental branches. RV LV ratio is 0.9. Borderline cardiomegaly. Small pericardial effusion, measuring up to 15 mm in thickness on the left side posteriorly. Nonaneurysmal aorta. Mild aortic atherosclerosis. Mediastinum/Nodes: Midline trachea. No thyroid mass. No significant adenopathy. Esophagus within normal limits. Lungs/Pleura: Mild emphysema. Scarring within the bilateral lung bases. No acute airspace disease or pleural effusion. Upper  Abdomen: No acute abnormality. Musculoskeletal: Chronic bilateral rib deformities. Review of the MIP images confirms the above findings. IMPRESSION: 1. Acute bilateral pulmonary emboli as described above. Positive for acute PE with CT evidence of right heart strain (RV/LV Ratio = 0.9) consistent with at least submassive (intermediate risk) PE. The presence of right heart strain has been associated with an increased risk of morbidity and mortality. Please activate Code PE by paging [phone]. 2. Mild emphysema.  No acute pulmonary infiltrates. Critical Value/emergent results were called by telephone at the time of interpretation on 01/26/2018 at 10:28 pm to Dr. Tilden Fossa , who verbally acknowledged these results. Aortic Atherosclerosis (ICD10-I70.0) and Emphysema (ICD10-J43.9). Electronically Signed   By: Jasmine Pang M.D.   On: 01/26/2018 22:28   Vas [place] Lower Extremity Venous (dvt)  Result Date: 01/27/2018  Lower Venous Study Indications: Edema.  Risk Factors: Confirmed PE. Performing Technologist: Farrel Demark RDMS, RVT  Examination Guidelines: A complete evaluation includes B-mode  imaging, spectral Doppler, color Doppler, and power Doppler as needed of all accessible portions of each vessel. Bilateral testing is considered an integral part of a complete examination. Limited examinations for reoccurring indications may be performed as noted.  Right Venous Findings: +---------------+---------------+---------+-----------+----------+-------+                CompressibilityPhasicitySpontaneityPropertiesSummary +---------------+---------------+---------+-----------+----------+-------+ CFV            None           Yes      Yes                  Acute   +---------------+---------------+---------+-----------+----------+-------+ SFJ            None                                         Acute   +---------------+---------------+---------+-----------+----------+-------+ FV Prox        None                                          Acute   +---------------+---------------+---------+-----------+----------+-------+ FV Mid         None                                         Acute   +---------------+---------------+---------+-----------+----------+-------+ FV Distal      None                                         Acute   +---------------+---------------+---------+-----------+----------+-------+ PFV            None                                         Acute   +---------------+---------------+---------+-----------+----------+-------+ POP            None           No       No                   Acute   +---------------+---------------+---------+-----------+----------+-------+ PTV            None                                         Acute   +---------------+---------------+---------+-----------+----------+-------+ PERO           None                                         Acute   +---------------+---------------+---------+-----------+----------+-------+ Gastroc        None  Acute   +---------------+---------------+---------+-----------+----------+-------+ Ex Iliac distalNone                                         Acute   +---------------+---------------+---------+-----------+----------+-------+ Patent mid external iliac vein by Color Doppler flow.  Left Venous Findings: +---------+---------------+---------+-----------+----------+-------+          CompressibilityPhasicitySpontaneityPropertiesSummary +---------+---------------+---------+-----------+----------+-------+ CFV      Full           Yes      Yes                          +---------+---------------+---------+-----------+----------+-------+ SFJ      Full                                                 +---------+---------------+---------+-----------+----------+-------+ FV Prox  Full                                                  +---------+---------------+---------+-----------+----------+-------+ FV Mid   Full                                                 +---------+---------------+---------+-----------+----------+-------+ FV DistalFull                                                 +---------+---------------+---------+-----------+----------+-------+ PFV      Full                                                 +---------+---------------+---------+-----------+----------+-------+ POP      Full           Yes      Yes                          +---------+---------------+---------+-----------+----------+-------+ PTV      Full                                                 +---------+---------------+---------+-----------+----------+-------+ PERO     Full                                                 +---------+---------------+---------+-----------+----------+-------+    Summary: Right: Findings consistent with acute deep vein thrombosis involving the right distal external iliac vein, right common femoral vein, right femoral vein, right proximal profunda vein, right popliteal vein, right posterior tibial vein, right peroneal vein, and right  gastrocnemius vein. No cystic structure found in the popliteal fossa. Left: There is no evidence of deep vein thrombosis in the lower extremity. No cystic structure found in the popliteal fossa.  *See table(s) above for measurements and observations. Electronically signed by Lemar Livings MD on 01/27/2018 at 1:53:15 PM.    Final         Scheduled Meds: . allopurinol  300 mg Oral Daily  . aspirin  81 mg Oral Daily  . docusate sodium  100 mg Oral BID  . feeding supplement (ENSURE ENLIVE)  237 mL Oral BID BM  . ferrous sulfate  325 mg Oral BID WC  . hydrocerin   Topical BID   Continuous Infusions: . heparin 850 Units/hr (01/28/18 0600)     LOS: 1 day     Marcellus Scott, MD, FACP, [facility]. [facility] Pager [phone]  If 7PM-7AM,  please contact night-coverage [email or web address] Password [facility] 01/28/2018, 2:24 PM

## 2018-01-28 NOTE — Care Management Note (Addendum)
Case Management Note  Patient Details  Name: Glenda Blankenship MRN: [id number] Date of Birth: 01/06/1941  Subjective/Objective:   PE                 Action/Plan:  Spoke to pt and granddtr, Glenda Blankenship at bedside Pt lives alone. States she has good family support. She ambulates without assistance devices. She drives to her appts. Pt states her Eliquis is $90 for 90 day supplies and paying for medications can be challenging on her fix income. Provided [place] with [facility] [place] patient [place] application. She will take to her PCP to complete. Explained office manager will fax to see if she get approved to receive assistance with medication for a year. Granddtr states they will assist her with paying for meds but pt does not tell them when she is out.   PCP Glenda Blankenship AGNP, at [facility] on [address]   Expected Discharge Date:  01/29/2018              Expected Discharge Plan:  Home/Self Care  In-House Referral:  NA  Discharge planning Services  CM Consult, Medication Assistance  Post Acute Care Choice:  NA Choice offered to:  NA  DME Arranged:  N/A DME Agency:  NA  HH Arranged:  NA HH Agency:  NA  Status of Service:  Completed, signed off  If discussed at Long Length of Stay Meetings, dates discussed:    Additional Comments:  Glenda Blankenship, Glenda Zwilling Ellen, RN 01/28/2018, 4:38 PM

## 2018-01-28 NOTE — Progress Notes (Signed)
Patient's vitals were 101.1F;HR   97;RR18;129/91;98% room air.  PCP was notified. Awaiting new orders.

## 2018-01-28 NOTE — Progress Notes (Addendum)
ANTICOAGULATION CONSULT NOTE - Follow-up Consult  Pharmacy Consult for IV heparin Indication: pulmonary embolus, RLE DVT  No Known Allergies  Patient Measurements: Height: 5\' 2"  (157.5 cm) Weight: 140 lb (63.5 kg) IBW/kg (Calculated) : 50.1 Heparin Dosing Weight: 64 kg  Vital Signs: Temp: 98.6 F (37 C) (12/01 0346) Temp Source: Oral (12/01 0346) BP: 108/53 (12/01 0629) Pulse Rate: 84 (12/01 0629)  Labs: Recent Labs    01/26/18 2056 01/27/18 0330 01/27/18 0822 01/27/18 1303 01/27/18 1751 01/28/18 0032 01/28/18 0312  HGB 9.8*  --  9.9*  --   --   --  9.3*  HCT 33.1*  --  34.7*  --   --   --  31.9*  PLT 242  --  252  --   --   --  253  APTT 35  --   --   --   --   --   --   LABPROT 13.8  --   --   --   --   --   --   INR 1.07  --   --   --   --   --   --   HEPARINUNFRC <0.10*  --  0.80*  --  0.54 0.48 0.44  CREATININE 1.28*  --   --   --   --   --  0.94  TROPONINI  --  <0.03 <0.03 <0.03  --   --   --     Estimated Creatinine Clearance: 43.9 mL/min (by C-G formula based on SCr of 0.94 mg/dL).  Infusions:  . heparin 850 Units/hr (01/28/18 0600)    Assessment: 77 yoF c/o SOB and rash on PTA apixaban for hx of PE. CT reveals acute bilateral PE with evidence of right heart strain. Noted pt also with RLE DVT. Last dose apixaban 11/29 0900 but baseline heparin level undetectable so ?compliance - Patient admits to nonadherence with Eliquis. Pharmacy consulted to dose heparin.  Heparin level remains therapeutic (0.44) on heparin gtt at 850 units/hr.  No bleeding or complications noted. Hgb low but stable.  Goal of Therapy:  Heparin level 0.3-0.7 units/ml  Monitor platelets by anticoagulation protocol: Yes   Plan: Continue heparin drip at 850 units/hr  Daily HL, CBC when stable Noted plan to continue heparin for 48 hours then transition to Eliquis  Christoper Fabianaron Shelah Heatley, PharmD, BCPS Clinical pharmacist 01/28/2018 8:56 AM

## 2018-01-28 NOTE — Progress Notes (Signed)
ANTICOAGULATION CONSULT NOTE - Follow-up Consult  Pharmacy Consult for IV heparin Indication: pulmonary embolus  No Known Allergies  Patient Measurements: Height: 5\' 2"  (157.5 cm) Weight: 140 lb (63.5 kg) IBW/kg (Calculated) : 50.1 Heparin Dosing Weight: 54 kg  Vital Signs: Temp: 99.1 F (37.3 C) (11/30 2346) Temp Source: Oral (11/30 2346) BP: 110/61 (11/30 2000) Pulse Rate: 81 (11/30 2000)  Labs: Recent Labs    01/26/18 2056 01/27/18 0330 01/27/18 0822 01/27/18 1303 01/27/18 1751 01/28/18 0032  HGB 9.8*  --  9.9*  --   --   --   HCT 33.1*  --  34.7*  --   --   --   PLT 242  --  252  --   --   --   APTT 35  --   --   --   --   --   LABPROT 13.8  --   --   --   --   --   INR 1.07  --   --   --   --   --   HEPARINUNFRC <0.10*  --  0.80*  --  0.54 0.48  CREATININE 1.28*  --   --   --   --   --   TROPONINI  --  <0.03 <0.03 <0.03  --   --     Estimated Creatinine Clearance: 32.2 mL/min (A) (by C-G formula based on SCr of 1.28 mg/dL (H)).   Medical History: Past Medical History:  Diagnosis Date  . Diabetes (HCC)   . DVT (deep vein thrombosis) in pregnancy   . Hypertension   . Pneumonia   . Renal disorder     Medications:  Scheduled:  . allopurinol  300 mg Oral Daily  . aspirin  81 mg Oral Daily  . docusate sodium  100 mg Oral BID  . feeding supplement (ENSURE ENLIVE)  237 mL Oral BID BM  . ferrous sulfate  325 mg Oral BID WC  . hydrocerin   Topical BID   Infusions:  . heparin 850 Units/hr (01/27/18 0927)    Assessment: 77 yoF c/o SOB and rash on PTA apixaban for hx of PE. CT reveals acute bilateral PE with evidence of right heart strain.  Last dose apixaban 11/29 0900 but baseline hepairn level undetectable so ?compliance. Pharmacy consulted to dose heparin.  Today, 12/1 0040 = 0.48 at goal, no infusion or bleeding issues reported per RN.   Goal of Therapy:  Heparin level 0.3-0.7 units/ml  Monitor platelets by anticoagulation protocol: Yes    Plan: Continue heparin drip at 850 units/hr Daily HL/CBC  Susanne GreenhouseGreen, Contessa Preuss R 01/28/2018,1:12 AM

## 2018-01-29 DIAGNOSIS — E1165 Type 2 diabetes mellitus with hyperglycemia: Secondary | ICD-10-CM

## 2018-01-29 DIAGNOSIS — N183 Chronic kidney disease, stage 3 (moderate): Secondary | ICD-10-CM

## 2018-01-29 DIAGNOSIS — R21 Rash and other nonspecific skin eruption: Secondary | ICD-10-CM

## 2018-01-29 DIAGNOSIS — I10 Essential (primary) hypertension: Secondary | ICD-10-CM

## 2018-01-29 DIAGNOSIS — D649 Anemia, unspecified: Secondary | ICD-10-CM

## 2018-01-29 LAB — GLUCOSE, CAPILLARY
GLUCOSE-CAPILLARY: 86 mg/dL (ref 70–99)
Glucose-Capillary: 84 mg/dL (ref 70–99)

## 2018-01-29 LAB — CBC
HCT: 32.2 % — ABNORMAL LOW (ref 36.0–46.0)
Hemoglobin: 9.5 g/dL — ABNORMAL LOW (ref 12.0–15.0)
MCH: 25.3 pg — ABNORMAL LOW (ref 26.0–34.0)
MCHC: 29.5 g/dL — ABNORMAL LOW (ref 30.0–36.0)
MCV: 85.6 fL (ref 80.0–100.0)
PLATELETS: 246 10*3/uL (ref 150–400)
RBC: 3.76 MIL/uL — ABNORMAL LOW (ref 3.87–5.11)
RDW: 15.7 % — ABNORMAL HIGH (ref 11.5–15.5)
WBC: 6 10*3/uL (ref 4.0–10.5)
nRBC: 0 % (ref 0.0–0.2)

## 2018-01-29 LAB — HEPARIN LEVEL (UNFRACTIONATED): Heparin Unfractionated: 0.44 IU/mL (ref 0.30–0.70)

## 2018-01-29 MED ORDER — ELIQUIS 5 MG VTE STARTER PACK: ORAL_TABLET | ORAL | 0 refills | Status: DC

## 2018-01-29 MED ORDER — INFLUENZA VAC SPLIT HIGH-DOSE 0.5 ML IM SUSY: 0.5000 mL | PREFILLED_SYRINGE | INTRAMUSCULAR | Status: DC

## 2018-01-29 MED ORDER — HYDROCERIN EX CREA: 1.0000 "application " | TOPICAL_CREAM | Freq: Every day | CUTANEOUS | 0 refills | Status: DC

## 2018-01-29 MED ORDER — ACETAMINOPHEN 325 MG PO TABS: 650.0000 mg | ORAL_TABLET | Freq: Four times a day (QID) | ORAL | Status: DC | PRN

## 2018-01-29 MED ORDER — FERROUS SULFATE 325 (65 FE) MG PO TABS: 325.0000 mg | ORAL_TABLET | Freq: Two times a day (BID) | ORAL | 0 refills | Status: DC

## 2018-01-29 MED ORDER — PNEUMOCOCCAL VAC POLYVALENT 25 MCG/0.5ML IJ INJ: 0.5000 mL | INJECTION | INTRAMUSCULAR | Status: DC

## 2018-01-29 MED ORDER — DOCUSATE SODIUM 100 MG PO CAPS: 100.0000 mg | ORAL_CAPSULE | Freq: Two times a day (BID) | ORAL | 0 refills | Status: DC

## 2018-01-29 MED ORDER — APIXABAN 5 MG PO TABS
10.0000 mg | ORAL_TABLET | Freq: Two times a day (BID) | ORAL | Status: DC
  Administered 2018-01-29: 10 mg via ORAL
  Filled 2018-01-29: qty 2

## 2018-01-29 NOTE — Care Management Note (Signed)
Case Management Note  Patient Details  Name: Glenda Blankenship MRN: [id number] Date of Birth: 03/21/1940  Subjective/Objective: PE. From home. Benefit checked for eliquis-$45 co pay-patient voiced understanding,she can afford.                   Action/Plan:dc home.   Expected Discharge Date:                  Expected Discharge Plan:  Home/Self Care  In-House Referral:  NA  Discharge planning Services  CM Consult, Medication Assistance  Post Acute Care Choice:  NA Choice offered to:  NA  DME Arranged:  N/A DME Agency:  NA  HH Arranged:  NA HH Agency:  NA  Status of Service:  Completed, signed off  If discussed at Long Length of Stay Meetings, dates discussed:    Additional Comments:  Lanier ClamMahabir, Mister Krahenbuhl, RN 01/29/2018, 12:23 PM

## 2018-01-29 NOTE — Progress Notes (Signed)
Patient verbalized understanding of discharge instructions. Patient is stable at discharge. 

## 2018-01-29 NOTE — Discharge Instructions (Signed)
Please get your medications reviewed and adjusted by your Primary MD. ° °Please request your Primary MD to go over all Hospital Tests and Procedure/Radiological results at the follow up, please get all Hospital records sent to your Prim MD by signing hospital release before you go home. ° °If you had Pneumonia of Lung problems at the Hospital: °Please get a 2 view Chest X ray done in 6-8 weeks after hospital discharge or sooner if instructed by your Primary MD. ° °If you have Congestive Heart Failure: °Please call your Cardiologist or Primary MD anytime you have any of the following symptoms:  °1) 3 pound weight gain in 24 hours or 5 pounds in 1 week  °2) shortness of breath, with or without a dry hacking cough  °3) swelling in the hands, feet or stomach  °4) if you have to sleep on extra pillows at night in order to breathe ° °Follow cardiac low salt diet and 1.5 lit/day fluid restriction. ° °If you have diabetes °Accuchecks 4 times/day, Once in AM empty stomach and then before each meal. °Log in all results and show them to your primary doctor at your next visit. °If any glucose reading is under 80 or above 300 call your primary MD immediately. ° °If you have Seizure/Convulsions/Epilepsy: °Please do not drive, operate heavy machinery, participate in activities at heights or participate in high speed sports until you have seen by Primary MD or a Neurologist and advised to do so again. ° °If you had Gastrointestinal Bleeding: °Please ask your Primary MD to check a complete blood count within one week of discharge or at your next visit. Your endoscopic/colonoscopic biopsies that are pending at the time of discharge, will also need to followed by your Primary MD. ° °Get Medicines reviewed and adjusted. °Please take all your medications with you for your next visit with your Primary MD ° °Please request your Primary MD to go over all hospital tests and procedure/radiological results at the follow up, please ask your  Primary MD to get all Hospital records sent to his/her office. ° °If you experience worsening of your admission symptoms, develop shortness of breath, life threatening emergency, suicidal or homicidal thoughts you must seek medical attention immediately by calling 911 or calling your MD immediately  if symptoms less severe. ° °You must read complete instructions/literature along with all the possible adverse reactions/side effects for all the Medicines you take and that have been prescribed to you. Take any new Medicines after you have completely understood and accpet all the possible adverse reactions/side effects.  ° °Do not drive or operate heavy machinery when taking Pain medications.  ° °Do not take more than prescribed Pain, Sleep and Anxiety Medications ° °Special Instructions: If you have smoked or chewed Tobacco  in the last 2 yrs please stop smoking, stop any regular Alcohol  and or any Recreational drug use. ° °Wear Seat belts while driving. ° °Please note °You were cared for by a hospitalist during your hospital stay. If you have any questions about your discharge medications or the care you received while you were in the hospital after you are discharged, you can call the unit and asked to speak with the hospitalist on call if the hospitalist that took care of you is not available. Once you are discharged, your primary care physician will handle any further medical issues. Please note that NO REFILLS for any discharge medications will be authorized once you are discharged, as it is imperative that you   return to your primary care physician (or establish a relationship with a primary care physician if you do not have one) for your aftercare needs so that they can reassess your need for medications and monitor your lab values.  You can reach the hospitalist office at phone [phone] or fax [phone]   If you do not have a primary care physician, you can call [phone] for a physician  referral.  Apixaban oral tablets What is this medicine? APIXABAN (a PIX a ban) is an anticoagulant (blood thinner). It is used to lower the chance of stroke in people with a medical condition called atrial fibrillation. It is also used to treat or prevent blood clots in the lungs or in the veins. This medicine may be used for other purposes; ask your health care provider or pharmacist if you have questions. COMMON BRAND NAME(S): Eliquis What should I tell my health care provider before I take this medicine? They need to know if you have any of these conditions: -bleeding disorders -bleeding in the brain -blood in your stools (black or tarry stools) or if you have blood in your vomit -history of stomach bleeding -kidney disease -liver disease -mechanical heart valve -an unusual or allergic reaction to apixaban, other medicines, foods, dyes, or preservatives -pregnant or trying to get pregnant -breast-feeding How should I use this medicine? Take this medicine by mouth with a glass of water. Follow the directions on the prescription label. You can take it with or without food. If it upsets your stomach, take it with food. Take your medicine at regular intervals. Do not take it more often than directed. Do not stop taking except on your doctor's advice. Stopping this medicine may increase your risk of a blot clot. Be sure to refill your prescription before you run out of medicine. Talk to your pediatrician regarding the use of this medicine in children. Special care may be needed. Overdosage: If you think you have taken too much of this medicine contact a poison control center or emergency room at once. NOTE: This medicine is only for you. Do not share this medicine with others. What if I miss a dose? If you miss a dose, take it as soon as you can. If it is almost time for your next dose, take only that dose. Do not take double or extra doses. What may interact with this medicine? This  medicine may interact with the following: -aspirin and aspirin-like medicines -certain medicines for fungal infections like ketoconazole and itraconazole -certain medicines for seizures like carbamazepine and phenytoin -certain medicines that treat or prevent blood clots like warfarin, enoxaparin, and dalteparin -clarithromycin -NSAIDs, medicines for pain and inflammation, like ibuprofen or naproxen -rifampin -ritonavir -St. John's wort This list may not describe all possible interactions. Give your health care provider a list of all the medicines, herbs, non-prescription drugs, or dietary supplements you use. Also tell them if you smoke, drink alcohol, or use illegal drugs. Some items may interact with your medicine. What should I watch for while using this medicine? Visit your doctor or health care professional for regular checks on your progress. Notify your doctor or health care professional and seek emergency treatment if you develop breathing problems; changes in vision; chest pain; severe, sudden headache; pain, swelling, warmth in the leg; trouble speaking; sudden numbness or weakness of the face, arm or leg. These can be signs that your condition has gotten worse. If you are going to have surgery or other procedure, tell your doctor that  you are taking this medicine. What side effects may I notice from receiving this medicine? Side effects that you should report to your doctor or health care professional as soon as possible: -allergic reactions like skin rash, itching or hives, swelling of the face, lips, or tongue -signs and symptoms of bleeding such as bloody or black, tarry stools; red or dark-brown urine; spitting up blood or brown material that looks like coffee grounds; red spots on the skin; unusual bruising or bleeding from the eye, gums, or nose This list may not describe all possible side effects. Call your doctor for medical advice about side effects. You may report side  effects to FDA at 1-800-FDA-1088. Where should I keep my medicine? Keep out of the reach of children. Store at room temperature between 20 and 25 degrees C (68 and 77 degrees F). Throw away any unused medicine after the expiration date. NOTE: This sheet is a summary. It may not cover all possible information. If you have questions about this medicine, talk to your doctor, pharmacist, or health care provider.  2018 [employer] Standard (2015-09-07 11:54:23)

## 2018-01-29 NOTE — Discharge Summary (Signed)
Physician Discharge Summary  Glenda Blankenship VWU:[id number] DOB: 1941/02/27  PCP: Carmel Sacramento, NP  Admit date: 01/26/2018 Discharge date: 01/29/2018  Recommendations for Outpatient Follow-up:  1. Carmel Sacramento, NP/PCP in 1 week with repeat labs (CBC & BMP). 2. Recommend outpatient Dermatology consultation for evaluation and management of face/neck skin rash. 3. Dr. Coralyn Helling, Pulmonology in 2 weeks.  Home Health: None Equipment/Devices: None  Discharge Condition: Improved and stable. CODE STATUS: DNR Diet recommendation: Heart healthy diet.  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  Admitted for acute bilateral submassive PE with right heart strain noted on CTA chest and acute right lower extremity DVT due to nonadherence with Eliquis.  EDP discussed with CCM.  Admitted to stepdown unit.  Initiated IV heparin drip and then transitioned back to Eliquis which she reports that she will be able to afford with her family's assistance.   Assessment & Plan:  Acute bilateral submassive PE and acute extensive right lower extremity DVT: Recurrent.  Now precipitated by nonadherence with Eliquis.  Reportedly ran out of Eliquis approximately 3 weeks PTA because she could not afford.   However she also states that in the past, her family were assisting with finances but she did not inform them this time.  They are willing to continue to support her in getting the Eliquis.  Hemodynamically stable and not hypoxic.  EDP discussed with CCM who recommended cycling troponins, checking TTE and reconsulting if decompensates.  Admitted to stepdown and initiated IV heparin and completed for >48 hours.  Transitioned today to Apixaban.  Discussed in detail with patient regarding importance of compliance with Eliquis at all times and she verbalized understanding.  Troponins negative. Discontinued aspirin 81 mg daily due to increased bleeding risk while on Apixaban. TTE unremarkable.  Remained hemodynamically stable and not hypoxic.  Patient had fever overnight without signs or symptoms of infection.  Could be related to extensive right lower extremity DVT.  PRN acetaminophen advised.  Patient follows with outpatient Pulmonology who she states fills her Apixaban prescriptions.  Dermatitis: Noted to have chronic rash on her face and neck.  Recommend outpatient Dermatology consultation.  Continue emollient cream.  DM 2 with renal complications: Good control as indicated by A1c 6.2.  Diet controlled at home.  Placed on SSI here.  Hypoglycemic range CBG/69 on 11/30 morning.  DC SSI and monitor CBGs alone. No further hypoglycemic episodes.  Good control.  Essential hypertension: She was not taking metoprolol PTA.  Antihypertensives (HCTZ-ACEI) were temporarily held.  She remained hemodynamically stable.  Antihypertensives were resumed at discharge.  Of note patient had not been taking potassium supplements for several weeks and her potassium was normal on admission.  Normocytic anemia: Could be iron deficiency versus chronic kidney disease versus chronic disease.  Iron  55, ferritin 67.  Started iron supplements. Follow CBC as outpatient.  Acute kidney injury on stage III chronic kidney disease:  Baseline creatinine probably in the 1.1 range.  Presented with creatinine of 1.28.  Slight elevation could be due to home antihypertensives.  Creatinine has normalized.  Follow BMP periodically as outpatient.  Gout: No flare.  Continue allopurinol.    Consultants:  None  Procedures:  Bilateral lower extremity venous Dopplers (preliminary result) 01/27/2018:  LE venous duplex prelim: RIGHT - - DVT involving the common femoral, femoral, PFV, popliteal, gastroc, PTV, PEROV. Extends up to distal right iliac, but mid iliac is patent.  LEFT negative for DVT.  TTE 01/27/2018:  Study Conclusions  - Left ventricle: The cavity size was normal. There was mild focal basal hypertrophy of the septum. Systolic function was normal. The estimated ejection fraction was in the range of 60% to 65%. Wall motion was normal; there were no regional wall motion abnormalities. - Aortic valve: There was no regurgitation. - Aortic root: The aortic root was normal in size. - Mitral valve: There was mild regurgitation. - Left atrium: The atrium was normal in size. - Right ventricle: The cavity size was mildly dilated. Wall thickness was normal. Systolic function was normal. - Right atrium: The atrium was normal in size. - Pulmonic valve: There was no regurgitation. - Pulmonary arteries: Systolic pressure was mildly increased. PA peak pressure: 39 mm Hg (S). - Inferior vena cava: The vessel was normal in size. - Pericardium, extracardiac: A trivial pericardial effusion was identified posterior to the heart.  Impressions:  - There is no significant change since the prior study on 04/27/2016.   Discharge Instructions  Discharge Instructions    Call MD for:  difficulty breathing, headache or visual disturbances   Complete by:  As directed    Call MD for:  extreme fatigue   Complete by:  As directed    Call MD for:  persistant dizziness or light-headedness   Complete by:  As  directed    Call MD for:  severe uncontrolled pain   Complete by:  As directed    Call MD for:  temperature >100.4   Complete by:  As directed    Diet - low sodium heart healthy   Complete by:  As directed    Diet Carb Modified   Complete by:  As directed    Increase activity slowly   Complete by:  As directed        Medication List    STOP taking these medications   aspirin 81 MG chewable tablet   metoprolol tartrate 25 MG tablet Commonly known as:  LOPRESSOR   potassium citrate 10 MEQ (1080 MG) SR tablet Commonly known as:  UROCIT-K     TAKE these medications   acetaminophen 325 MG tablet Commonly known as:  TYLENOL Take 2 tablets (650 mg total) by mouth every 6 (six) hours as needed for mild pain, fever or headache.   allopurinol 300 MG tablet Commonly known as:  ZYLOPRIM Take 300 mg by mouth daily.   docusate sodium 100 MG capsule Commonly known as:  COLACE Take 1 capsule (100 mg total) by mouth 2 (two) times daily.   ELIQUIS STARTER PACK 5 MG Tabs Take as directed on package: start with two-5mg  tablets twice daily for 7 days. On day 8, switch to one-5mg  tablet twice daily. What changed:    how much to take  how to take this  when to take this  additional instructions   ferrous sulfate 325 (65 FE) MG tablet Take 1 tablet (325 mg total) by mouth 2 (two) times daily with a meal.   hydrocerin Crea Apply 1 application topically daily. Apply to areas of skin rash on face, neck, arm.   lisinopril-hydrochlorothiazide 20-25 MG tablet Commonly known as:  PRINZIDE,ZESTORETIC Take 1 tablet by mouth daily.      Follow-up Information    Carmel Sacramento, NP. Schedule an appointment as soon as possible for a visit in 1 week(s).   Specialty:  Nurse Practitioner Why:  To be seen with repeat labs (CBC & BMP).  Recommend outpatient Dermatology consultation for evaluation of face/neck rash. Contact information: [address] [place]  [address] [phone]        Coralyn Helling, MD. Schedule an appointment as soon as possible for a visit in 2 week(s).   Specialty:  Pulmonary Disease Contact information: [address] [place] [address] [phone]          No Known Allergies    Procedures/Studies: Dg Chest 2 View  Result Date: 01/26/2018 CLINICAL DATA:  Shortness of breath 1 week. EXAM: CHEST - 2 VIEW COMPARISON:  01/25/2016 FINDINGS: Lungs are adequately inflated without consolidation or effusion. Cardiomediastinal silhouette and remainder of the exam is unchanged. IMPRESSION: No active cardiopulmonary disease. Electronically Signed   By: Elberta Fortis M.D.   On: 01/26/2018 18:47   Ct Angio Chest Pe W/cm &/or Wo Cm  Result Date: 01/26/2018 CLINICAL DATA:  Shortness of breath EXAM: CT ANGIOGRAPHY CHEST WITH CONTRAST TECHNIQUE: Multidetector CT imaging of the chest was performed using the standard protocol during bolus administration of intravenous contrast. Multiplanar CT image reconstructions and MIPs were obtained to evaluate the vascular anatomy. CONTRAST:  ISOVUE-370 IOPAMIDOL (ISOVUE-370) INJECTION 76% COMPARISON:  Chest x-ray 01/26/2018, CT chest 01/12/2016 FINDINGS: Cardiovascular: Satisfactory opacification of the pulmonary arteries to the segmental level. Acute pulmonary embolus within the distal right pulmonary artery with extension of thrombus into right inter lobar, and multiple right upper, middle and lower lobe segmental and subsegmental branches. Acute thrombus within the distal left pulmonary artery with extension of thrombi in to left upper and lower lobe segmental and subsegmental branches. RV LV ratio is 0.9. Borderline cardiomegaly. Small pericardial effusion, measuring up to 15 mm in thickness on the left side posteriorly. Nonaneurysmal aorta. Mild aortic atherosclerosis. Mediastinum/Nodes: Midline trachea. No thyroid mass. No significant adenopathy. Esophagus within normal  limits. Lungs/Pleura: Mild emphysema. Scarring within the bilateral lung bases. No acute airspace disease or pleural effusion. Upper Abdomen: No acute abnormality. Musculoskeletal: Chronic bilateral rib deformities. Review of the MIP images confirms the above findings. IMPRESSION: 1. Acute bilateral pulmonary emboli as described above. Positive for acute PE with CT evidence of right heart strain (RV/LV Ratio = 0.9) consistent with at least submassive (intermediate risk) PE. The presence of right heart strain has been associated with an increased risk of morbidity and mortality. Please activate Code PE by paging [phone]. 2. Mild emphysema.  No acute pulmonary infiltrates. Critical Value/emergent results were called by telephone at the time of interpretation on 01/26/2018 at 10:28 pm to Dr. Tilden Fossa , who verbally acknowledged these results. Aortic Atherosclerosis (ICD10-I70.0) and Emphysema (ICD10-J43.9). Electronically Signed   By: Jasmine Pang M.D.   On: 01/26/2018 22:28   Vas [place] Lower Extremity Venous (dvt)  Result Date: 01/27/2018  Lower Venous Study Indications: Edema.  Risk Factors: Confirmed PE. Performing Technologist: Farrel Demark RDMS, RVT  Examination Guidelines:  A complete evaluation includes B-mode imaging, spectral Doppler, color Doppler, and power Doppler as needed of all accessible portions of each vessel. Bilateral testing is considered an integral part of a complete examination. Limited examinations for reoccurring indications may be performed as noted.  Right Venous Findings: +---------------+---------------+---------+-----------+----------+-------+                CompressibilityPhasicitySpontaneityPropertiesSummary +---------------+---------------+---------+-----------+----------+-------+ CFV            None           Yes      Yes                  Acute   +---------------+---------------+---------+-----------+----------+-------+ SFJ            None                                          Acute   +---------------+---------------+---------+-----------+----------+-------+ FV Prox        None                                         Acute   +---------------+---------------+---------+-----------+----------+-------+ FV Mid         None                                         Acute   +---------------+---------------+---------+-----------+----------+-------+ FV Distal      None                                         Acute   +---------------+---------------+---------+-----------+----------+-------+ PFV            None                                         Acute   +---------------+---------------+---------+-----------+----------+-------+ POP            None           No       No                   Acute   +---------------+---------------+---------+-----------+----------+-------+ PTV            None                                         Acute   +---------------+---------------+---------+-----------+----------+-------+ PERO           None                                         Acute   +---------------+---------------+---------+-----------+----------+-------+ Gastroc        None  Acute   +---------------+---------------+---------+-----------+----------+-------+ Ex Iliac distalNone                                         Acute   +---------------+---------------+---------+-----------+----------+-------+ Patent mid external iliac vein by Color Doppler flow.  Left Venous Findings: +---------+---------------+---------+-----------+----------+-------+          CompressibilityPhasicitySpontaneityPropertiesSummary +---------+---------------+---------+-----------+----------+-------+ CFV      Full           Yes      Yes                          +---------+---------------+---------+-----------+----------+-------+ SFJ      Full                                                  +---------+---------------+---------+-----------+----------+-------+ FV Prox  Full                                                 +---------+---------------+---------+-----------+----------+-------+ FV Mid   Full                                                 +---------+---------------+---------+-----------+----------+-------+ FV DistalFull                                                 +---------+---------------+---------+-----------+----------+-------+ PFV      Full                                                 +---------+---------------+---------+-----------+----------+-------+ POP      Full           Yes      Yes                          +---------+---------------+---------+-----------+----------+-------+ PTV      Full                                                 +---------+---------------+---------+-----------+----------+-------+ PERO     Full                                                 +---------+---------------+---------+-----------+----------+-------+    Summary: Right: Findings consistent with acute deep vein thrombosis involving the right distal external iliac vein, right common femoral vein, right femoral vein, right proximal profunda vein, right popliteal vein, right posterior tibial vein, right peroneal vein, and right  gastrocnemius vein. No cystic structure found in the popliteal fossa. Left: There is no evidence of deep vein thrombosis in the lower extremity. No cystic structure found in the popliteal fossa.  *See table(s) above for measurements and observations. Electronically signed by Lemar LivingsBrandon Cain MD on 01/27/2018 at 1:53:15 PM.    Final       Subjective: Patient states that she is doing well.  Denies complaints.  No chest pain, dyspnea, palpitations, dizziness, lightheadedness, right leg pain or swelling reported.  Had fever overnight but no symptoms to suggest source.  No headache, earache, sore throat, runny nose, sneezing,  cough, nausea, vomiting, diarrhea, new rash, joint pains or swelling, dysuria, urinary frequency or urgency.  Discharge Exam:  Vitals:   01/29/18 0150 01/29/18 0437 01/29/18 0440 01/29/18 1221  BP:  (!) 90/46 (!) 106/45 (!) 115/51  Pulse:  81 81 79  Resp:  12 18 16   Temp: 99.2 F (37.3 C) 98.6 F (37 C) 98.5 F (36.9 C) 99.2 F (37.3 C)  TempSrc: Oral Oral Oral Oral  SpO2:  97% 96% 98%  Weight:      Height:        General exam: Pleasant elderly female, moderately built and nourished lying comfortably propped up in bed. Respiratory system: Clear to auscultation. Respiratory effort normal. Cardiovascular system: S1 & S2 heard, RRR. No JVD, murmurs, rubs, gallops or clicks. No pedal edema.  Telemetry personally reviewed: Sinus rhythm. Gastrointestinal system: Abdomen is nondistended, soft and nontender. No organomegaly or masses felt. Normal bowel sounds heard. Central nervous system: Alert and oriented. No focal neurological deficits. Extremities: Symmetric 5 x 5 power.  Right leg may be slightly asymmetrically swollen diffusely compared to left without any other acute findings.  Neurovascularly intact.   Skin: Noted xerosis and hyperpigmentation of skin of her face and neck.  No acute findings. Psychiatry: Judgement and insight appear normal. Mood & affect appropriate.     The results of significant diagnostics from this hospitalization (including imaging, microbiology, ancillary and laboratory) are listed below for reference.     Microbiology: Recent Results (from the past 240 hour(s))  MRSA PCR Screening     Status: None   Collection Time: 01/27/18  2:20 AM  Result Value Ref Range Status   MRSA by PCR NEGATIVE NEGATIVE Final    Comment:        The GeneXpert MRSA Assay (FDA approved for NASAL specimens only), is one component of a comprehensive MRSA colonization surveillance program. It is not intended to diagnose MRSA infection nor to guide or monitor treatment  for MRSA infections. Performed at [facility], [address] [place], [place] [id number]      Labs: CBC: Recent Labs  Lab 01/26/18 2056 01/27/18 0822 01/28/18 0312 01/29/18 0523  WBC 5.4 4.7 5.1 6.0  NEUTROABS 3.3  --   --   --   HGB 9.8* 9.9* 9.3* 9.5*  HCT 33.1* 34.7* 31.9* 32.2*  MCV 86.0 87.4 84.4 85.6  PLT 242 252 253 246   Basic Metabolic Panel: Recent Labs  Lab 01/26/18 2056 01/28/18 0312  NA 139 136  K 3.7 4.4  CL 103 101  CO2 26 28  GLUCOSE 102* 94  BUN 43* 32*  CREATININE 1.28* 0.94  CALCIUM 9.5 9.2   Cardiac Enzymes: Recent Labs  Lab 01/27/18 0330 01/27/18 0822 01/27/18 1303  TROPONINI <0.03 <0.03 <0.03   CBG: Recent Labs  Lab 01/28/18 1144 01/28/18 1638 01/28/18 2148 01/29/18 0739 01/29/18 1152  GLUCAP 90 103* 75 86 84   Hgb A1c Recent Labs    01/27/18 0330  HGBA1C 6.2*   Anemia work up Recent Labs    01/27/18 0330  FERRITIN 67  TIBC 294  IRON 55   I discussed in detail with patient's daughter at bedside, updated care and answer questions.   Time coordinating discharge: 40 minutes  SIGNED:  Marcellus Scott, MD, FACP, [facility]. [facility] Pager [phone]  If 7PM-7AM, please contact night-coverage [email or web address] Password [email or web address] 01/29/2018, 2:06 PM

## 2018-01-29 NOTE — Progress Notes (Signed)
ANTICOAGULATION CONSULT NOTE - Follow-up Consult  Pharmacy Consult for IV heparin being transitioned to apixaban Indication: pulmonary embolus, RLE DVT  No Known Allergies  Patient Measurements: Height: 5\' 2"  (157.5 cm) Weight: 140 lb (63.5 kg) IBW/kg (Calculated) : 50.1 Heparin Dosing Weight: 64 kg  Vital Signs: Temp: 98.5 F (36.9 C) (12/02 0440) Temp Source: Oral (12/02 0440) BP: 106/45 (12/02 0440) Pulse Rate: 81 (12/02 0440)  Labs: Recent Labs    01/26/18 2056 01/27/18 0330 01/27/18 0822 01/27/18 1303  01/28/18 0032 01/28/18 0312 01/29/18 0523  HGB 9.8*  --  9.9*  --   --   --  9.3* 9.5*  HCT 33.1*  --  34.7*  --   --   --  31.9* 32.2*  PLT 242  --  252  --   --   --  253 246  APTT 35  --   --   --   --   --   --   --   LABPROT 13.8  --   --   --   --   --   --   --   INR 1.07  --   --   --   --   --   --   --   HEPARINUNFRC <0.10*  --  0.80*  --    < > 0.48 0.44 0.44  CREATININE 1.28*  --   --   --   --   --  0.94  --   TROPONINI  --  <0.03 <0.03 <0.03  --   --   --   --    < > = values in this interval not displayed.    Estimated Creatinine Clearance: 43.9 mL/min (by C-G formula based on SCr of 0.94 mg/dL).  Infusions:  . sodium chloride 10 mL/hr at 01/28/18 2301    Assessment: 77 yoF c/o SOB and rash on PTA apixaban for hx of PE. CT reveals acute bilateral PE with evidence of right heart strain. Noted pt also with RLE DVT. Last dose apixaban 11/29 0900 but baseline heparin level undetectable so ?compliance - Patient admits to nonadherence with Eliquis. Pharmacy consulted to dose heparin.  Today, 01/29/18   Scr 0.94 mg/dl, CrCl 44 ml/min  Hgb 9.5 stable, platelets 246  HL this AM is 0.44, therapeutic  No bleeding issues no note  Transition to apixaban this AM    Goal of Therapy:  Heparin level 0.3-0.7 units/ml  Monitor platelets by anticoagulation protocol: Yes   Plan:  Stop heparin drip and start apixaban 10 mg PO BID x 7 days as per notes  pt had not had apixaban for three weeks leading to development of DVT  After 7 days on 02/05/2018 start apixaban 5 mg PO BID   Monitor for signs and symptoms of bleeding    Adalberto ColeNikola Jeret Goyer, PharmD, BCPS Pager [phone] 01/29/2018 9:09 AM

## 2018-02-19 ENCOUNTER — Encounter: Payer: Self-pay | Admitting: Pulmonary Disease

## 2018-02-19 ENCOUNTER — Other Ambulatory Visit: Payer: Self-pay

## 2018-02-19 ENCOUNTER — Emergency Department (HOSPITAL_COMMUNITY)
Admission: EM | Admit: 2018-02-19 | Discharge: 2018-02-19 | Disposition: A | Discharge status: Home / Self Care | Payer: Medicare Other | Attending: Emergency Medicine | Admitting: Emergency Medicine

## 2018-02-19 ENCOUNTER — Emergency Department (HOSPITAL_COMMUNITY): Payer: Medicare Other

## 2018-02-19 ENCOUNTER — Encounter (HOSPITAL_COMMUNITY): Payer: Self-pay

## 2018-02-19 ENCOUNTER — Ambulatory Visit (INDEPENDENT_AMBULATORY_CARE_PROVIDER_SITE_OTHER): Payer: Medicare Other | Admitting: Pulmonary Disease

## 2018-02-19 VITALS — BP 118/60 | HR 110 | Temp 100.1°F | Ht 62.0 in | Wt 139.6 lb

## 2018-02-19 DIAGNOSIS — N183 Chronic kidney disease, stage 3 (moderate): Secondary | ICD-10-CM | POA: Diagnosis not present

## 2018-02-19 DIAGNOSIS — Z79899 Other long term (current) drug therapy: Secondary | ICD-10-CM | POA: Diagnosis not present

## 2018-02-19 DIAGNOSIS — I129 Hypertensive chronic kidney disease with stage 1 through stage 4 chronic kidney disease, or unspecified chronic kidney disease: Secondary | ICD-10-CM | POA: Diagnosis not present

## 2018-02-19 DIAGNOSIS — Z7901 Long term (current) use of anticoagulants: Secondary | ICD-10-CM | POA: Insufficient documentation

## 2018-02-19 DIAGNOSIS — L03211 Cellulitis of face: Secondary | ICD-10-CM | POA: Diagnosis not present

## 2018-02-19 DIAGNOSIS — K047 Periapical abscess without sinus: Secondary | ICD-10-CM | POA: Diagnosis not present

## 2018-02-19 DIAGNOSIS — Z87891 Personal history of nicotine dependence: Secondary | ICD-10-CM | POA: Diagnosis not present

## 2018-02-19 DIAGNOSIS — I2602 Saddle embolus of pulmonary artery with acute cor pulmonale: Secondary | ICD-10-CM | POA: Diagnosis not present

## 2018-02-19 DIAGNOSIS — R220 Localized swelling, mass and lump, head: Secondary | ICD-10-CM | POA: Diagnosis present

## 2018-02-19 DIAGNOSIS — E1122 Type 2 diabetes mellitus with diabetic chronic kidney disease: Secondary | ICD-10-CM | POA: Diagnosis not present

## 2018-02-19 LAB — CBC WITH DIFFERENTIAL/PLATELET
Abs Immature Granulocytes: 0.05 10*3/uL (ref 0.00–0.07)
Basophils Absolute: 0 10*3/uL (ref 0.0–0.1)
Basophils Relative: 0 %
Eosinophils Absolute: 0 10*3/uL (ref 0.0–0.5)
Eosinophils Relative: 1 %
HCT: 32 % — ABNORMAL LOW (ref 36.0–46.0)
Hemoglobin: 9.3 g/dL — ABNORMAL LOW (ref 12.0–15.0)
Immature Granulocytes: 1 %
Lymphocytes Relative: 25 %
Lymphs Abs: 2 10*3/uL (ref 0.7–4.0)
MCH: 26.6 pg (ref 26.0–34.0)
MCHC: 29.1 g/dL — ABNORMAL LOW (ref 30.0–36.0)
MCV: 91.4 fL (ref 80.0–100.0)
Monocytes Absolute: 0.9 10*3/uL (ref 0.1–1.0)
Monocytes Relative: 11 %
Neutro Abs: 5 10*3/uL (ref 1.7–7.7)
Neutrophils Relative %: 62 %
Platelets: 335 10*3/uL (ref 150–400)
RBC: 3.5 MIL/uL — ABNORMAL LOW (ref 3.87–5.11)
RDW: 17 % — ABNORMAL HIGH (ref 11.5–15.5)
WBC: 8 10*3/uL (ref 4.0–10.5)
nRBC: 0 % (ref 0.0–0.2)

## 2018-02-19 LAB — BASIC METABOLIC PANEL
Anion gap: 9 (ref 5–15)
BUN: 12 mg/dL (ref 8–23)
CO2: 28 mmol/L (ref 22–32)
Calcium: 9.3 mg/dL (ref 8.9–10.3)
Chloride: 101 mmol/L (ref 98–111)
Creatinine, Ser: 0.96 mg/dL (ref 0.44–1.00)
GFR calc Af Amer: >60 mL/min (ref >60)
GFR calc non Af Amer: 57 mL/min — ABNORMAL LOW (ref >60)
Glucose, Bld: 104 mg/dL — ABNORMAL HIGH (ref 70–99)
POTASSIUM: 4.1 mmol/L (ref 3.5–5.1)
Sodium: 138 mmol/L (ref 135–145)

## 2018-02-19 MED ORDER — APIXABAN 5 MG PO TABS: 5.0000 mg | ORAL_TABLET | Freq: Two times a day (BID) | ORAL | 1 refills | Status: DC

## 2018-02-19 MED ORDER — SODIUM CHLORIDE 0.9 % IV BOLUS
500.0000 mL | Freq: Once | INTRAVENOUS | Status: AC
  Administered 2018-02-19: 500 mL via INTRAVENOUS

## 2018-02-19 MED ORDER — CLINDAMYCIN HCL 150 MG PO CAPS: 300.0000 mg | ORAL_CAPSULE | Freq: Three times a day (TID) | ORAL | 0 refills | Status: AC

## 2018-02-19 MED ORDER — CLINDAMYCIN PHOSPHATE 600 MG/50ML IV SOLN
600.0000 mg | Freq: Once | INTRAVENOUS | Status: AC
  Administered 2018-02-19: 600 mg via INTRAVENOUS
  Filled 2018-02-19: qty 50

## 2018-02-19 MED ORDER — IOHEXOL 300 MG/ML  SOLN
75.0000 mL | Freq: Once | INTRAMUSCULAR | Status: AC | PRN
  Administered 2018-02-19: 75 mL via INTRAVENOUS

## 2018-02-19 MED ORDER — SODIUM CHLORIDE (PF) 0.9 % IJ SOLN
INTRAMUSCULAR | Status: AC
  Filled 2018-02-19: qty 50

## 2018-02-19 NOTE — Assessment & Plan Note (Signed)
Continue Eliquis at this time  >>>further evaluation needed at ER   Follow up with Dr. Craige CottaSood or Elisha HeadlandBrian Deyani Hegarty FNP in 2-4 weeks after hospital discharge

## 2018-02-19 NOTE — Discharge Instructions (Signed)
As discussed, it is important that you monitor your condition carefully and do not hesitate to return here.

## 2018-02-19 NOTE — ED Provider Notes (Signed)
[facility]-EMERGENCY DEPT Provider Note   CSN: [id number] Arrival date & time: 02/19/18  1617     History   Chief Complaint Chief Complaint  Patient presents with  . Facial Swelling    HPI Glenda Blankenship is a 77 y.o. female.  HPI Patient presents with concern for facial swelling. Patient was in her usual state of health when she went to bed yesterday.  When she woke this morning with substantial swelling throughout the left face, from the TMJ inferiorly, and anteriorly, though not crossing midline. There is some associated erythema.  No known precipitant, since onset symptoms been persistent, no medication taken for pain relief. No fever, no chills, no vomiting, no dyspnea. Past Medical History:  Diagnosis Date  . Diabetes (HCC)   . DVT (deep vein thrombosis) in pregnancy   . Hypertension   . Pneumonia   . Renal disorder     Patient Active Problem List   Diagnosis Date Noted  . Dental abscess 02/19/2018  . Skin rash 01/27/2018  . Chronic anemia 01/27/2018  . Type 2 diabetes mellitus (HCC) 01/27/2018  . HTN (hypertension) 01/27/2018  . CKD (chronic kidney disease) stage 3, GFR 30-59 ml/min (HCC) 01/27/2018  . Acute pulmonary embolism (HCC) 01/26/2018  . Acute saddle pulmonary embolism with acute cor pulmonale (HCC)   . Deep venous thrombosis (HCC)   . Septic shock (HCC)   . Hyperkalemia   . Gastroenteritis   . Pleural effusion, right   . Hypovolemic shock (HCC) 01/08/2016  . Shock (HCC) 01/08/2016  . Acute kidney injury (HCC)   . Metabolic acidosis   . Lactic acidosis     Past Surgical History:  Procedure Laterality Date  . kidney stone removal       OB History   No obstetric history on file.      Home Medications    Prior to Admission medications   Medication Sig Start Date End Date Taking? Authorizing Provider  ferrous sulfate 325 (65 FE) MG tablet Take 1 tablet (325 mg total) by mouth 2 (two) times daily with a meal.  01/29/18  Yes Hongalgi, Maximino Greenland, MD  acetaminophen (TYLENOL) 325 MG tablet Take 2 tablets (650 mg total) by mouth every 6 (six) hours as needed for mild pain, fever or headache. Patient not taking: Reported on 02/19/2018 01/29/18   Elease Etienne, MD  apixaban (ELIQUIS) 5 MG TABS tablet Take 1 tablet (5 mg total) by mouth 2 (two) times daily. 02/19/18   Coral Ceo, NP  docusate sodium (COLACE) 100 MG capsule Take 1 capsule (100 mg total) by mouth 2 (two) times daily. Patient not taking: Reported on 02/19/2018 01/29/18   Elease Etienne, MD  hydrocerin (EUCERIN) CREA Apply 1 application topically daily. Apply to areas of skin rash on face, neck, arm. Patient not taking: Reported on 02/19/2018 01/29/18   Elease Etienne, MD    Family History History reviewed. No pertinent family history.  Social History Social History   Tobacco Use  . Smoking status: Former Smoker    Types: Cigarettes  . Smokeless tobacco: Never Used  . Tobacco comment: quit "so loing ago" not give me a date  Substance Use Topics  . Alcohol use: No  . Drug use: No     Allergies   Patient has no known allergies.   Review of Systems Review of Systems  Constitutional:       Per HPI, otherwise negative  HENT:  Per HPI, otherwise negative  Respiratory:       Per HPI, otherwise negative  Cardiovascular:       Per HPI, otherwise negative  Gastrointestinal: Negative for vomiting.  Endocrine:       Negative aside from HPI  Genitourinary:       Neg aside from HPI   Musculoskeletal:       Per HPI, otherwise negative  Skin: Negative.   Neurological: Negative for syncope.     Physical Exam Updated Vital Signs BP (!) 154/57 (BP Location: Left Arm)   Pulse 86   Temp 99.6 F (37.6 C) (Oral)   Resp 17   LMP  (LMP Unknown)   SpO2 100%   Physical Exam Vitals signs and nursing note reviewed.  Constitutional:      General: She is not in acute distress.    Appearance: She is well-developed.    HENT:     Head:      Mouth/Throat:   Eyes:     Conjunctiva/sclera: Conjunctivae normal.  Cardiovascular:     Rate and Rhythm: Normal rate and regular rhythm.  Pulmonary:     Effort: Pulmonary effort is normal. No respiratory distress.     Breath sounds: Normal breath sounds. No stridor.  Abdominal:     General: There is no distension.  Skin:    General: Skin is warm and dry.  Neurological:     Mental Status: She is alert and oriented to person, place, and time.     Cranial Nerves: No cranial nerve deficit.      ED Treatments / Results  Labs (all labs ordered are listed, but only abnormal results are displayed) Labs Reviewed  BASIC METABOLIC PANEL - Abnormal; Notable for the following components:      Result Value   Glucose, Bld 104 (*)    GFR calc non Af Amer 57 (*)    All other components within normal limits  CBC WITH DIFFERENTIAL/PLATELET - Abnormal; Notable for the following components:   RBC 3.50 (*)    Hemoglobin 9.3 (*)    HCT 32.0 (*)    MCHC 29.1 (*)    RDW 17.0 (*)    All other components within normal limits    EKG None  Radiology Ct Maxillofacial W Contrast  Result Date: 02/19/2018 CLINICAL DATA:  77 year old female with left jaw swelling upon waking today. Fever of 101. Left side dental disease. EXAM: CT MAXILLOFACIAL WITH CONTRAST TECHNIQUE: Multidetector CT imaging of the maxillofacial structures was performed with intravenous contrast. Multiplanar CT image reconstructions were also generated. CONTRAST:  75mL OMNIPAQUE IOHEXOL 300 MG/ML  SOLN COMPARISON:  None. FINDINGS: Osseous: Multifocal left side dental caries, involving the mandible and maxillary molars, bicuspids. Cortical breakthrough from the residual left maxillary molar on series 12, image 46, although similar contralateral right maxillary anterior molar cortical breakthrough. No superimposed fracture of the mandible or maxilla. Zygoma and nasal bones intact. Central skull base intact. 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 Orbits: Intact orbital walls. Symmetric and negative orbits soft tissues. Sinuses: Well pneumatized with only trace bilateral paranasal sinus mucosal thickening. Tympanic cavities are clear. Mastoids are hypoplastic but clear. Soft tissues: Severe left facial soft tissue swelling and inflammatory stranding with epicenter at the left mandible alveolus. Confluent subperiosteal edema demonstrated on series 11, image 66, but no regional abscess. Secondary inflammation in the left parapharyngeal space at the level of the oropharynx. The retropharyngeal space and right parapharyngeal space remain normal.  Secondary inflammation in the left submandibular space with thickening of the platysma. The submandibular glands appear symmetric with punctate sialolithiasis. However, the parotid glands are asymmetric and the left parotid is abnormal with bulky cystic change within the gland or severe intraglandular ductal ectasia best seen on sagittal series 6, image 64. However, there is no dilatation of the left parotid duct. No hyperenhancement of the residual left parotid parenchyma. Reactive level 1 lymph nodes greater on the left. Reactive left level 2A lymph nodes. These nodes are hyperenhancing and mildly enlarged but no cystic or necrotic nodes are identified. The major vascular structures in the neck and at the skull base are patent including the left IJ. There is bulky dystrophic appearing calcification of the right palatine tonsil. Limited intracranial: Negative visualized brain parenchyma. Cavernous sinus appears patent. IMPRESSION: 1. Odontogenic left facial cellulitis suspected, with multifocal bilateral carious dentition but discrete dental source identified. Reactive level 1 and left level 2 lymphadenopathy. 2. Likewise, confluent soft tissue edema but no abscess or drainable fluid collection. Secondary inflammation in the left  parapharyngeal and submandibular spaces. 3. Abnormal left parotid gland is favored to be unrelated to the above. 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 Recommend follow-up Neck CT with IV contrast following resolution of the above acute process. Electronically Signed   By: Odessa FlemingH  Hall M.D.   On: 02/19/2018 21:44    Procedures Procedures (including critical care time)  Medications Ordered in ED Medications  sodium chloride (PF) 0.9 % injection (has no administration in time range)  clindamycin (CLEOCIN) IVPB 600 mg (has no administration in time range)  sodium chloride 0.9 % bolus 500 mL (0 mLs Intravenous Stopped 02/19/18 2102)  iohexol (OMNIPAQUE) 300 MG/ML solution 75 mL (75 mLs Intravenous Contrast Given 02/19/18 2056)     Initial Impression / Assessment and Plan / ED Course  I have reviewed the triage vital signs and the nursing notes.  Pertinent labs & imaging results that were available during my care of the patient were reviewed by me and considered in my medical decision making (see chart for details).  10:09 PM Repeat exam the patient is awake, alert, speaking clearly, smiling. We discussed all findings, with family members present. We discussed evidence for facial cellulitis, dental infection. We discussed admission for monitoring management versus initial IV antibiotics, discharged with oral antibiotics. Patient is a strong preference for this second option, and given her absence of hemodynamic instability, absence of respiratory compromise, this is reasonable. Patient understands return precautions, follow-up instructions.  Update:, Patient tolerated initial antibiotics well, without complications, will be discharged, as above.  Final Clinical Impressions(s) / ED Diagnoses  Facial cellulitis Dental infection   Gerhard MunchLockwood, Lolly Glaus, MD 02/19/18 2210

## 2018-02-19 NOTE — Patient Instructions (Addendum)
[facility]  Will need follow-up with dental outpatient  Follow up with Dr. Craige CottaSood or Elisha HeadlandBrian Cerissa Zeiger FNP in 2-4 weeks after hospital discharge  It is flu season:   >>>Remember to be washing your hands regularly, using hand sanitizer, be careful to use around herself with has contact with people who are sick will increase her chances of getting sick yourself. >>> Best ways to protect herself from the flu: Receive the yearly flu vaccine, practice good hand hygiene washing with soap and also using hand sanitizer when available, eat a nutritious meals, get adequate rest, hydrate appropriately   Please contact the office if your symptoms worsen or you have concerns that you are not improving.   Thank you for choosing [facility] for your healthcare, and for allowing us to partner with you on your healthcare journey. I am thankful to be able to provide care to you today.   Elisha HeadlandBrian Veryl Winemiller FNP-C

## 2018-02-19 NOTE — Progress Notes (Signed)
 @Patient  ID: Glenda Blankenship, female    DOB: 05/02/1940, 77 y.o.   MRN: [id number]  Chief Complaint  Patient presents with  . Follow-up    Patient reports six-month follow-up was recently discharged from the hospital in November/2019    Referring provider: Carmel Blankenship, [place], NP  HPI:  77 year old female former smoker followed in our office for PE and DVT managed on Eliquis  PMH: Type 2 diabetes, NSTEMI/type II, demand ischemia in the setting of saddle PE and right ventricular strain, HTN, stage III chronic kidney disease, gout, Smoker/ Smoking History: Former Smoker  Maintenance: Eliquis for prevention of PE and DVTs Pt of: Dr. Craige CottaSood   02/19/2018  - Visit   77 year old female patient presenting today for follow-up visit.  Patient reporting that she has been doing okay since being discharged from the hospital with another pulmonary embolism.  Patient has not followed up with primary care as instructed.  Patient was instructed to follow-up with pulmonary in 2 weeks which she is now doing 4 weeks later.  Patient reports she remains on Eliquis but needs a refill as patient will run out of Eliquis tablets soon.  Patient with prominent left facial swelling on arrival to our office today.  Patient can barely open her mouth.  Patient reports she went to sleep normally last night and woke up with the swelling.  Patient reported this happened 1 time before and it was an abscess that she needed to be referred to an oral surgeon for.  Patient's temperature in our office today is 100.1.  Patient denies hemoptysis, hematuria, blood in stool.  Patient denies shortness of breath, fatigue, wheezing.     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  Patient admitted to the  hospital with acute bilateral submassive PE with right heart strain noted on CTA. Discharge plan: Restarted on Eliquis, follow-up with primary care in 2 weeks, follow-up with pulmonary in 2 weeks    Tests:   CT abd/pelvis 01/08/16 >> small b/l effusions, atherosclerosis Echo 01/11/16>> EF 65 to 70%, grade 1 DD, mod RV dilation, severe TR, PAS 52 mmHg, mild/mod pericardial effusion Lt thoracentesis 01/11/16 >> glucose 155, protein < 3, LDH 138, WBC 758 (50L, 64M, 11E) CTA Chest 01/12/16>> acute saddle PE with evidence of RV strain (ratio 1:1), small pleural effusions, partially loculated on R, small pericardial effusion B/l LE Duplex 01/13/16 >> DVT Rt popliteal, posterior tibial veins Echo 04/27/16 >> EF 60 to 65%, PAS 35 mmHg Hypercoagulable panel 08/15/16 >> Beta 2 Glycoprotein IgA 34, anticardiolipin IgG 21, anticardiolipin IgM 36 Lower extremity doppler 08/25/16 >> no DVT  FENO:  No results found for: NITRICOXIDE  PFT: No flowsheet data found.  Imaging: Dg Chest 2 View  Result Date: 01/26/2018 CLINICAL DATA:  Shortness of breath 1 week. EXAM: CHEST - 2 VIEW COMPARISON:  01/25/2016 FINDINGS: Lungs are adequately inflated without consolidation or effusion. Cardiomediastinal silhouette and remainder of the exam is unchanged. IMPRESSION: No active cardiopulmonary disease. Electronically Signed   By: Elberta Fortisaniel  Boyle M.D.   On: 01/26/2018 18:47   Ct Angio Chest Pe W/cm &/or Wo Cm  Result Date: 01/26/2018 CLINICAL DATA:  Shortness of breath EXAM: CT ANGIOGRAPHY CHEST WITH CONTRAST TECHNIQUE: Multidetector CT imaging of the chest was performed using the standard protocol  during bolus administration of intravenous contrast. Multiplanar CT image reconstructions and MIPs were obtained to evaluate the vascular anatomy. CONTRAST:  ISOVUE-370 IOPAMIDOL (ISOVUE-370) INJECTION 76% COMPARISON:  Chest x-ray 01/26/2018, CT chest 01/12/2016 FINDINGS: Cardiovascular: Satisfactory opacification of the  pulmonary arteries to the segmental level. Acute pulmonary embolus within the distal right pulmonary artery with extension of thrombus into right inter lobar, and multiple right upper, middle and lower lobe segmental and subsegmental branches. Acute thrombus within the distal left pulmonary artery with extension of thrombi in to left upper and lower lobe segmental and subsegmental branches. RV LV ratio is 0.9. Borderline cardiomegaly. Small pericardial effusion, measuring up to 15 mm in thickness on the left side posteriorly. Nonaneurysmal aorta. Mild aortic atherosclerosis. Mediastinum/Nodes: Midline trachea. No thyroid mass. No significant adenopathy. Esophagus within normal limits. Lungs/Pleura: Mild emphysema. Scarring within the bilateral lung bases. No acute airspace disease or pleural effusion. Upper Abdomen: No acute abnormality. Musculoskeletal: Chronic bilateral rib deformities. Review of the MIP images confirms the above findings. IMPRESSION: 1. Acute bilateral pulmonary emboli as described above. Positive for acute PE with CT evidence of right heart strain (RV/LV Ratio = 0.9) consistent with at least submassive (intermediate risk) PE. The presence of right heart strain has been associated with an increased risk of morbidity and mortality. Please activate Code PE by paging [phone]. 2. Mild emphysema.  No acute pulmonary infiltrates. Critical Value/emergent results were called by telephone at the time of interpretation on 01/26/2018 at 10:28 pm to Dr. Tilden Fossa , who verbally acknowledged these results. Aortic Atherosclerosis (ICD10-I70.0) and Emphysema (ICD10-J43.9). Electronically Signed   By: Jasmine Pang M.D.   On: 01/26/2018 22:28   Vas [place] Lower Extremity Venous (dvt)  Result Date: 01/27/2018  Lower Venous Study Indications: Edema.  Risk Factors: Confirmed PE. Performing Technologist: Farrel Demark RDMS, RVT  Examination Guidelines: A complete evaluation includes B-mode imaging, spectral  Doppler, color Doppler, and power Doppler as needed of all accessible portions of each vessel. Bilateral testing is considered an integral part of a complete examination. Limited examinations for reoccurring indications may be performed as noted.  Right Venous Findings: +---------------+---------------+---------+-----------+----------+-------+                CompressibilityPhasicitySpontaneityPropertiesSummary +---------------+---------------+---------+-----------+----------+-------+ CFV            None           Yes      Yes                  Acute   +---------------+---------------+---------+-----------+----------+-------+ SFJ            None                                         Acute   +---------------+---------------+---------+-----------+----------+-------+ FV Prox        None                                         Acute   +---------------+---------------+---------+-----------+----------+-------+ FV Mid         None                                         Acute   +---------------+---------------+---------+-----------+----------+-------+  FV Distal      None                                         Acute   +---------------+---------------+---------+-----------+----------+-------+ PFV            None                                         Acute   +---------------+---------------+---------+-----------+----------+-------+ POP            None           No       No                   Acute   +---------------+---------------+---------+-----------+----------+-------+ PTV            None                                         Acute   +---------------+---------------+---------+-----------+----------+-------+ PERO           None                                         Acute   +---------------+---------------+---------+-----------+----------+-------+ Gastroc        None                                         Acute    +---------------+---------------+---------+-----------+----------+-------+ Ex Iliac distalNone                                         Acute   +---------------+---------------+---------+-----------+----------+-------+ Patent mid external iliac vein by Color Doppler flow.  Left Venous Findings: +---------+---------------+---------+-----------+----------+-------+          CompressibilityPhasicitySpontaneityPropertiesSummary +---------+---------------+---------+-----------+----------+-------+ CFV      Full           Yes      Yes                          +---------+---------------+---------+-----------+----------+-------+ SFJ      Full                                                 +---------+---------------+---------+-----------+----------+-------+ FV Prox  Full                                                 +---------+---------------+---------+-----------+----------+-------+ FV Mid   Full                                                 +---------+---------------+---------+-----------+----------+-------+  FV DistalFull                                                 +---------+---------------+---------+-----------+----------+-------+ PFV      Full                                                 +---------+---------------+---------+-----------+----------+-------+ POP      Full           Yes      Yes                          +---------+---------------+---------+-----------+----------+-------+ PTV      Full                                                 +---------+---------------+---------+-----------+----------+-------+ PERO     Full                                                 +---------+---------------+---------+-----------+----------+-------+    Summary: Right: Findings consistent with acute deep vein thrombosis involving the right distal external iliac vein, right common femoral vein, right femoral vein, right proximal profunda vein,  right popliteal vein, right posterior tibial vein, right peroneal vein, and right gastrocnemius vein. No cystic structure found in the popliteal fossa. Left: There is no evidence of deep vein thrombosis in the lower extremity. No cystic structure found in the popliteal fossa.  *See table(s) above for measurements and observations. Electronically signed by Lemar Livings MD on 01/27/2018 at 1:53:15 PM.    Final       Specialty Problems      Pulmonary Problems   Pleural effusion, right      No Known Allergies  Immunization History  Administered Date(s) Administered  . Influenza-Unspecified 12/29/2012, 12/07/2015   >>> Can further evaluate at next office visit patient's pneumonia vaccine status as well as flu vaccine status  Past Medical History:  Diagnosis Date  . Diabetes (HCC)   . DVT (deep vein thrombosis) in pregnancy   . Hypertension   . Pneumonia   . Renal disorder     Tobacco History: Social History   Tobacco Use  Smoking Status Former Smoker  . Types: Cigarettes  Smokeless Tobacco Never Used  Tobacco Comment   quit "so loing ago" not give me a date   Counseling given: Not Answered Comment: quit "so loing ago" not give me a date  Continue to not smoke  No facility-administered encounter medications on file as of 02/19/2018.    Outpatient Encounter Medications as of 02/19/2018  Medication Sig  . acetaminophen (TYLENOL) 325 MG tablet Take 2 tablets (650 mg total) by mouth every 6 (six) hours as needed for mild pain, fever or headache. (Patient not taking: Reported on 02/19/2018)  . docusate sodium (COLACE) 100 MG capsule Take 1 capsule (100 mg total) by mouth 2 (two) times daily. (Patient not taking: Reported on 02/19/2018)  .  ELIQUIS STARTER PACK (ELIQUIS STARTER PACK) 5 MG TABS Take as directed on package: start with two-5mg  tablets twice daily for 7 days. On day 8, switch to one-5mg  tablet twice daily. (Patient taking differently: Take 5 mg by mouth 2 (two) times  daily. )  . ferrous sulfate 325 (65 FE) MG tablet Take 1 tablet (325 mg total) by mouth 2 (two) times daily with a meal.  . hydrocerin (EUCERIN) CREA Apply 1 application topically daily. Apply to areas of skin rash on face, neck, arm. (Patient not taking: Reported on 02/19/2018)  . [DISCONTINUED] allopurinol (ZYLOPRIM) 300 MG tablet Take 300 mg by mouth daily.  . [DISCONTINUED] lisinopril-hydrochlorothiazide (PRINZIDE,ZESTORETIC) 20-25 MG tablet Take 1 tablet by mouth daily.     Review of Systems  Review of Systems  Constitutional: Negative for chills, fatigue, fever and unexpected weight change.  HENT: Positive for nosebleeds (nose irritation ). Negative for congestion, ear pain, postnasal drip, sinus pressure and sinus pain.   Respiratory: Negative for cough, chest tightness, shortness of breath and wheezing.   Cardiovascular: Positive for leg swelling. Negative for chest pain and palpitations.  Gastrointestinal: Negative for blood in stool, diarrhea, nausea and vomiting.  Genitourinary: Negative for dysuria, frequency, hematuria and urgency.  Musculoskeletal: Negative for arthralgias.  Skin: Negative for color change.  Allergic/Immunologic: Negative for environmental allergies and food allergies.  Neurological: Positive for facial asymmetry (left abscess pet pt - will see dentist ). Negative for dizziness, light-headedness and headaches.  Psychiatric/Behavioral: Negative for dysphoric mood. The patient is not nervous/anxious.   All other systems reviewed and are negative.    Physical Exam  BP 118/60 (BP Location: Left Arm, Cuff Size: Normal)   Pulse (!) 110   Temp 100.1 F (37.8 C)   Ht 5\' 2"  (1.575 m)   Wt 139 lb 9.6 oz (63.3 kg)   LMP  (LMP Unknown)   SpO2 95%   BMI 25.53 kg/m   Wt Readings from Last 5 Encounters:  02/19/18 139 lb 9.6 oz (63.3 kg)  01/26/18 140 lb (63.5 kg)  12/15/16 155 lb (70.3 kg)  08/15/16 156 lb 9.6 oz (71 kg)  04/29/16 155 lb 6.4 oz (70.5 kg)    Temperature in office today is 100.1  Physical Exam  Constitutional: She is oriented to person, place, and time and well-developed, well-nourished, and in no distress. No distress.  HENT:  Head:    Right Ear: Hearing, tympanic membrane, external ear and ear canal normal.  Left Ear: Hearing, tympanic membrane, external ear and ear canal normal.  Nose: Mucosal edema present. Right sinus exhibits no maxillary sinus tenderness and no frontal sinus tenderness. Left sinus exhibits no maxillary sinus tenderness and no frontal sinus tenderness.  Mouth/Throat: Abnormal dentition. Dental abscesses and dental caries present.    + Healing nasal mucosa + Unable to fully visualize oropharynx due to facial swelling  Eyes: Pupils are equal, round, and reactive to light.  Neck: Edema, erythema and decreased range of motion present.    Cardiovascular: Normal rate, regular rhythm and normal heart sounds.  Pulmonary/Chest: Effort normal and breath sounds normal. No accessory muscle usage. No respiratory distress. She has no decreased breath sounds. She has no wheezes. She has no rhonchi.  Abdominal: Soft. Bowel sounds are normal. There is no abdominal tenderness.  Musculoskeletal:        General: Edema (1+ lower extremity edema bilaterally) present.  Lymphadenopathy:    She has no cervical adenopathy.  Neurological: She is alert and oriented to  person, place, and time. Gait normal.  Skin: Skin is warm and dry. She is not diaphoretic. No erythema.  Psychiatric: Mood, memory and judgment normal. She is apathetic. She has a flat affect.  Nursing note and vitals reviewed.     Lab Results:  CBC    Component Value Date/Time   WBC 8.0 02/19/2018 1842   RBC 3.50 (L) 02/19/2018 1842   HGB 9.3 (L) 02/19/2018 1842   HCT 32.0 (L) 02/19/2018 1842   PLT 335 02/19/2018 1842   MCV 91.4 02/19/2018 1842   MCH 26.6 02/19/2018 1842   MCHC 29.1 (L) 02/19/2018 1842   RDW 17.0 (H) 02/19/2018 1842    LYMPHSABS 2.0 02/19/2018 1842   MONOABS 0.9 02/19/2018 1842   EOSABS 0.0 02/19/2018 1842   BASOSABS 0.0 02/19/2018 1842    BMET    Component Value Date/Time   NA 138 02/19/2018 1842   K 4.1 02/19/2018 1842   CL 101 02/19/2018 1842   CO2 28 02/19/2018 1842   GLUCOSE 104 (H) 02/19/2018 1842   BUN 12 02/19/2018 1842   CREATININE 0.96 02/19/2018 1842   CALCIUM 9.3 02/19/2018 1842   GFRNONAA 57 (L) 02/19/2018 1842   GFRAA >60 02/19/2018 1842    BNP No results found for: BNP  ProBNP No results found for: PROBNP    Assessment & Plan:   Pleasant 77 year old female patient completing follow-up with our office today.  Unfortunately due to patient's left facial abscess versus hematoma will need to refer patient to you emergency room for further evaluation.  Difficult to fully assess patient's mouth and oropharynx due to limited visibility but it appears patient has several broken teeth that could be source of infection.  Patient with tachycardia as well as fever in office today.  With patient's previous history of abscess I believe she needs further evaluation and I doubt she would be able to have quick outpatient care as she will most likely need to see an oral surgeon and she is on a blood thinner.  Patient is also high risk for sepsis due to multiple comorbid conditions, poor dentition, tachycardia, and fever.  I have discussed this at length with the patient.  Patient will need to follow-up with our office after she completes her emergency room visit or hospital discharge.  Patient knows she also needs to follow-up with primary care.  Dental abscess ?Abscess vs. Hematoma  >>>favors abscess due to history and poor dentition  >>>temp today 100.1 >>>HR elevated today   Plan:  Please present to [facility] emergency room for further evaluation Will need follow-up with dental outpatient   Acute saddle pulmonary embolism with acute cor pulmonale (HCC) Continue Eliquis at this time   >>>further evaluation needed at ER   Follow up with Dr. Craige Cotta or Elisha Headland FNP in 2-4 weeks after hospital discharge      Coral Ceo, NP 02/19/2018   This appointment was 42 minutes along with over 50% of the time in direct face-to-face patient care, assessment, plan of care, and follow-up.

## 2018-02-19 NOTE — ED Triage Notes (Signed)
Patient went to [facility] for check up. Patient c/o left jaw swelling this morning. Patient states she woke up with the facial swelling around 0900. Pulmonary dr. sent patient to ED. Temp 101 at office.  Denies pain.  Patient states she has a tooth that needs to be pulled on the left side.   A/Ox4 Ambulatory in triage.

## 2018-02-19 NOTE — ED Notes (Signed)
Patient transported to CT 

## 2018-02-19 NOTE — Assessment & Plan Note (Addendum)
?  Abscess vs. Hematoma  >>>favors abscess due to history and poor dentition  >>>temp today 100.1 >>>HR elevated today  >>>high risk of sepsis   Plan:  Please present to [facility] emergency room for further evaluation Will need follow-up with dental outpatient

## 2018-02-20 NOTE — Progress Notes (Signed)
Reviewed and agree with assessment/plan.   Cortina Vultaggio, MD [facility] 02/24/2016, 12:24 PM Pager:  [phone]  

## 2018-02-27 ENCOUNTER — Telehealth: Payer: Self-pay | Admitting: Nurse Practitioner

## 2018-02-27 NOTE — Telephone Encounter (Signed)
Called to see if this patient is keeping Carmel Sacramentoerry, [place], NP as her PCP or is she going to [facility].  If she is going to use [facility] as a PCP, then we need to schedule an AWV-S with [facility] and OV with [facility].  Voicemail box was full at this time.

## 2018-03-02 NOTE — Telephone Encounter (Signed)
Tried to call patient to set up AWV-S. Last AWV 12/19/16.  Phone rang with no answer.  Message states this person has a voicemail that has not been set up.lec

## 2018-03-06 NOTE — Telephone Encounter (Signed)
Called patient to see if she is using [facility] as her PCP, or Carmel [place], NP.  If seeing [facility], need set up AWV-S.  Last AWV 12/19/16.  There was no answer and no voicemail box has been set up yet.  lec

## 2018-03-07 ENCOUNTER — Ambulatory Visit: Payer: Medicare Other | Admitting: Pulmonary Disease

## 2018-03-08 ENCOUNTER — Telehealth: Payer: Self-pay | Admitting: Pulmonary Disease

## 2018-03-08 NOTE — Telephone Encounter (Signed)
Called and spoke to pt, who states she received a call from our office yesterday. Pt stated that she is unsure as to who called.  I do not see documentation of our office contacting pt.  Pt has pending appt on 03/12/18 with Arlys John. Nothing further is needed.

## 2018-03-12 ENCOUNTER — Telehealth: Payer: Self-pay | Admitting: Pulmonary Disease

## 2018-03-12 ENCOUNTER — Encounter: Payer: Self-pay | Admitting: Pulmonary Disease

## 2018-03-12 ENCOUNTER — Ambulatory Visit: Payer: Medicare Other | Admitting: Pulmonary Disease

## 2018-03-12 DIAGNOSIS — I2602 Saddle embolus of pulmonary artery with acute cor pulmonale: Secondary | ICD-10-CM

## 2018-03-12 DIAGNOSIS — K047 Periapical abscess without sinus: Secondary | ICD-10-CM

## 2018-03-12 DIAGNOSIS — Z23 Encounter for immunization: Secondary | ICD-10-CM

## 2018-03-12 DIAGNOSIS — Z0000 Encounter for general adult medical examination without abnormal findings: Secondary | ICD-10-CM

## 2018-03-12 MED ORDER — APIXABAN 5 MG PO TABS: 5.0000 mg | ORAL_TABLET | Freq: Two times a day (BID) | ORAL | 2 refills | Status: DC

## 2018-03-12 NOTE — Patient Instructions (Addendum)
Continue the Eliquis as prescribed >>>90-day prescription was sent into [facility]  Please follow-up with your primary care provider regarding your CT that was performed in the emergency room >>> Radiology is recommending you receive a neck CT with contrast following the resolution of your cellulitis I believe best for your PCP to order this to follow  Proceed forward with getting your tooth pulled on Wednesday  Follow-up with our office in 3 months with Dr. Craige Cotta  It is flu season:   >>>Remember to be washing your hands regularly, using hand sanitizer, be careful to use around herself with has contact with people who are sick will increase her chances of getting sick yourself. >>> Best ways to protect herself from the flu: Receive the yearly flu vaccine, practice good hand hygiene washing with soap and also using hand sanitizer when available, eat a nutritious meals, get adequate rest, hydrate appropriately   Please contact the office if your symptoms worsen or you have concerns that you are not improving.   Thank you for choosing [facility] for your healthcare, and for allowing [place] to partner with you on your healthcare journey. I am thankful to be able to provide care to you today.   Elisha Headland FNP-C

## 2018-03-12 NOTE — Assessment & Plan Note (Signed)
High-dose flu vaccine today 

## 2018-03-12 NOTE — Progress Notes (Signed)
 @Patient  ID: Glenda ChenMarie J Blankenship, female    DOB: 11/14/1940, 78 y.o.   MRN: [id number]  Chief Complaint  Patient presents with  . Follow-up    Follow-up from dental abscess and continue management of PE    Referring provider: Carmel Sacramentoerry, [place], NP  HPI:  78 year old female former smoker followed in our office for PE and DVT managed on Eliquis  PMH: Type 2 diabetes, NSTEMI/type II, demand ischemia in the setting of saddle PE and right ventricular strain, HTN, stage III chronic kidney disease, gout, Smoker/ Smoking History: Former Smoker  Maintenance: Eliquis for prevention of PE and DVTs Pt of: Dr. Craige CottaSood   03/12/2018  - Visit   78 year old female former smoker presenting to our office today for follow-up visit.  Patient reports significant improvement after last office visit when she had to present to the emergency room for treatment of a dental abscess.  Patient was treated with IV antibiotics and a CT maxillofacial was performed.  Patient was later discharged that night.  Patient has plans to get a tooth pulled on 03/14/2018.  Patient reports that she feels significantly better and has no issues swallowing.   Patient continues to be managed on Eliquis.  Patient is requesting 90-day prescriptions for this.   Tests:   CT abd/pelvis 01/08/16 >> small b/l effusions, atherosclerosis Echo 01/11/16>> EF 65 to 70%, grade 1 DD, mod RV dilation, severe TR, PAS 52 mmHg, mild/mod pericardial effusion Lt thoracentesis 01/11/16 >> glucose 155, protein < 3, LDH 138, WBC 758 (50L, 93M, 11E) CTA Chest 01/12/16>> acute saddle PE with evidence of RV strain (ratio 1:1), small pleural effusions, partially loculated on R, small pericardial effusion B/l LE Duplex 01/13/16 >> DVT Rt popliteal, posterior tibial veins Echo 04/27/16 >> EF 60 to 65%, PAS 35 mmHg Hypercoagulable panel 08/15/16 >> Beta 2 Glycoprotein IgA 34, anticardiolipin IgG 21, anticardiolipin IgM 36 Lower extremity doppler 08/25/16 >> no  DVT  02/19/2018-CT maxillofacial with contrast- odontogenic left facial cellulitis suspected with multifocal bilateral carious dentition but-discrete dental source identified, reactive level 1 and level 2 lymphadenopathy, confluent soft tissue edema but no abscess or drainable fluid collection, secondary inflammation of the left parapharyngeal and submandibular spaces, abnormal left parotid gland is favored to be unrelated, 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  Imaging: Ct Maxillofacial W Contrast  Result Date: 02/19/2018 CLINICAL DATA:  78 year old female with left jaw swelling upon waking today. Fever of 101. Left side dental disease. EXAM: CT MAXILLOFACIAL WITH CONTRAST TECHNIQUE: Multidetector CT imaging of the maxillofacial structures was performed with intravenous contrast. Multiplanar CT image reconstructions were also generated. CONTRAST:  75mL OMNIPAQUE IOHEXOL 300 MG/ML  SOLN COMPARISON:  None. FINDINGS: Osseous: Multifocal left side dental caries, involving the mandible and maxillary molars, bicuspids. Cortical breakthrough from the residual left maxillary molar on series 12, image 46, although similar contralateral right maxillary anterior molar cortical breakthrough. No superimposed fracture of the mandible or maxilla. Zygoma and nasal bones intact. Central skull base intact. Severe cervical spine degeneration with multilevel postoperative versus acquired ankylosis. Orbits: Intact orbital walls. Symmetric and negative orbits soft tissues. Sinuses: Well pneumatized with only trace bilateral paranasal sinus mucosal thickening. Tympanic cavities are clear. Mastoids are hypoplastic but clear. Soft tissues: Severe left facial soft tissue swelling and inflammatory stranding  with epicenter at the left mandible alveolus. Confluent subperiosteal edema demonstrated on series 11, image 66, but no regional abscess. Secondary inflammation in the left parapharyngeal space at the level of the oropharynx. The retropharyngeal space and right parapharyngeal space remain normal. Secondary inflammation in the left submandibular space with thickening of the platysma. The submandibular glands appear symmetric with punctate sialolithiasis. However, the parotid glands are asymmetric and the left parotid is abnormal with bulky cystic change within the gland or severe intraglandular ductal ectasia best seen on sagittal series 6, image 64. However, there is no dilatation of the left parotid duct. No hyperenhancement of the residual left parotid parenchyma. Reactive level 1 lymph nodes greater on the left. Reactive left level 2A lymph nodes. These nodes are hyperenhancing and mildly enlarged but no cystic or necrotic nodes are identified. The major vascular structures in the neck and at the skull base are patent including the left IJ. There is bulky dystrophic appearing calcification of the right palatine tonsil. Limited intracranial: Negative visualized brain parenchyma. Cavernous sinus appears patent. IMPRESSION: 1. Odontogenic left facial cellulitis suspected, with multifocal bilateral carious dentition but discrete dental source identified. Reactive level 1 and left level 2 lymphadenopathy. 2. Likewise, confluent soft tissue edema but no abscess or drainable fluid collection. Secondary inflammation in the left parapharyngeal and submandibular spaces. 3. Abnormal left parotid gland is favored to be unrelated to the above. 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 Recommend follow-up Neck CT with IV contrast following resolution of the above acute process. Electronically Signed   By: Odessa Fleming M.D.   On: 02/19/2018 21:44      Specialty Problems       Pulmonary Problems   Pleural effusion, right      No Known Allergies  Immunization History  Administered Date(s) Administered  . Influenza, High Dose Seasonal PF 03/12/2018  . Influenza-Unspecified 12/29/2012, 12/07/2015   Flu vaccine today  Past Medical History:  Diagnosis Date  . Diabetes (HCC)   . DVT (deep vein thrombosis) in pregnancy   . Hypertension   . Pneumonia   . Renal disorder     Tobacco History: Social History   Tobacco Use  Smoking Status Former Smoker  . Types: Cigarettes  Smokeless Tobacco Never Used  Tobacco Comment   quit "so loing ago" not give me a date   Counseling given: Yes Comment: quit "so loing ago" not give me a date  Continue to not smoke  Outpatient Encounter Medications as of 03/12/2018  Medication Sig  . apixaban (ELIQUIS) 5 MG TABS tablet Take 1 tablet (5 mg total) by mouth 2 (two) times daily.  . [DISCONTINUED] apixaban (ELIQUIS) 5 MG TABS tablet Take 1 tablet (5 mg total) by mouth 2 (two) times daily.  Marland Kitchen acetaminophen (TYLENOL) 325 MG tablet Take 2 tablets (650 mg total) by mouth every 6 (six) hours as needed for mild pain, fever or headache. (Patient not taking: Reported on 03/12/2018)  . docusate sodium (COLACE) 100 MG capsule Take 1 capsule (100 mg total) by mouth 2 (two) times daily. (Patient not taking: Reported on 02/19/2018)  . ferrous sulfate 325 (65 FE) MG tablet Take 1 tablet (325 mg total) by mouth 2 (two) times daily with a meal. (Patient not taking: Reported on 03/12/2018)  . hydrocerin (EUCERIN) CREA Apply 1 application topically daily. Apply to areas of skin rash on face, neck, arm. (Patient not taking: Reported on 02/19/2018)   No facility-administered  encounter medications on file as of 03/12/2018.     Review of Systems  Review of Systems  Constitutional: Negative for fatigue and fever.  HENT: Positive for dental problem (tooth to be pulled on wednesday ). Negative for sinus pressure and sinus pain.   Respiratory:  Negative for cough, shortness of breath and wheezing.   Cardiovascular: Negative for chest pain and palpitations.  Gastrointestinal: Negative for blood in stool, diarrhea, nausea and vomiting.  Genitourinary: Negative for hematuria.  Psychiatric/Behavioral: Negative for dysphoric mood. The patient is not nervous/anxious.      Physical Exam  BP (!) 132/56 (BP Location: Left Arm, Cuff Size: Normal)   Pulse 84   Ht 5\' 2"  (1.575 m)   Wt 142 lb 3.2 oz (64.5 kg)   LMP  (LMP Unknown)   SpO2 97%   BMI 26.01 kg/m   Wt Readings from Last 5 Encounters:  03/12/18 142 lb 3.2 oz (64.5 kg)  02/19/18 139 lb 9.6 oz (63.3 kg)  01/26/18 140 lb (63.5 kg)  12/15/16 155 lb (70.3 kg)  08/15/16 156 lb 9.6 oz (71 kg)    Physical Exam  Constitutional: She is oriented to person, place, and time and well-developed, well-nourished, and in no distress. No distress.  HENT:  Head: Normocephalic and atraumatic.  Right Ear: Hearing, tympanic membrane, external ear and ear canal normal.  Left Ear: Hearing, tympanic membrane, external ear and ear canal normal.  Mouth/Throat: Uvula is midline and oropharynx is clear and moist. Abnormal dentition. Dental caries present. No oropharyngeal exudate.  + Resolved left facial swelling from previous office visit  Eyes: Pupils are equal, round, and reactive to light.  Neck: Normal range of motion. Neck supple.  Cardiovascular: Normal rate, regular rhythm and normal heart sounds.  Pulmonary/Chest: Effort normal and breath sounds normal. No accessory muscle usage. No respiratory distress. She has no decreased breath sounds. She has no wheezes. She has no rhonchi.  Musculoskeletal: Normal range of motion.        General: No edema.  Lymphadenopathy:    She has no cervical adenopathy.  Neurological: She is alert and oriented to person, place, and time. Gait normal.  Skin: Skin is warm and dry. She is not diaphoretic. No erythema.  Psychiatric: Mood, memory, affect and  judgment normal.  Nursing note and vitals reviewed.     Lab Results:  CBC    Component Value Date/Time   WBC 8.0 02/19/2018 1842   RBC 3.50 (L) 02/19/2018 1842   HGB 9.3 (L) 02/19/2018 1842   HCT 32.0 (L) 02/19/2018 1842   PLT 335 02/19/2018 1842   MCV 91.4 02/19/2018 1842   MCH 26.6 02/19/2018 1842   MCHC 29.1 (L) 02/19/2018 1842   RDW 17.0 (H) 02/19/2018 1842   LYMPHSABS 2.0 02/19/2018 1842   MONOABS 0.9 02/19/2018 1842   EOSABS 0.0 02/19/2018 1842   BASOSABS 0.0 02/19/2018 1842    BMET    Component Value Date/Time   NA 138 02/19/2018 1842   K 4.1 02/19/2018 1842   CL 101 02/19/2018 1842   CO2 28 02/19/2018 1842   GLUCOSE 104 (H) 02/19/2018 1842   BUN 12 02/19/2018 1842   CREATININE 0.96 02/19/2018 1842   CALCIUM 9.3 02/19/2018 1842   GFRNONAA 57 (L) 02/19/2018 1842   GFRAA >60 02/19/2018 1842    BNP No results found for: BNP  ProBNP No results found for: PROBNP    Assessment & Plan:   Acute saddle pulmonary embolism with acute cor pulmonale (  HCC) Assessment: Patient currently stable and managed well on Eliquis  Plan: Continue Eliquis as prescribed 90-day prescription sent into pharmacy Follow-up in 3 months with Dr. Craige CottaSood  Dental abscess Assessment: Significant improvement following last office visit after treatment of IV antibiotics and discharged on p.o. antibiotics  Plan: -Continue follow-up with tooth being pulled on 03/14/2018 -Follow-up with primary care regarding her need for neck CT with contrast based off of radiology's recommendations with your CT maxillofacial performed in the emergency room -We have contacted patient's primary care office to notify them of the recommendation of a CT neck this will be discussed at her next primary care office visit  Healthcare maintenance High-dose flu vaccine today     Coral CeoBrian P , NP 03/12/2018   This appointment was 28 minutes long with over 50% of the time in direct face-to-face patient  care, assessment, plan of care, and follow-up.

## 2018-03-12 NOTE — Telephone Encounter (Signed)
Called and spoke with patients Primary Caregiver, Carmel Sacramento, NP to let them aware of the CT she had in the Emergency room recently. Patient has an appt with Carmel Sacramento NP on 03/22/18. Wanting to make sure this was addressed the patient needs a Neck CT with contrast.  Also that the notes from today's visit would be routed to him. Primary Care made a note and will address at patient's appointment on 03/22/18.  Nothing further needed.

## 2018-03-12 NOTE — Assessment & Plan Note (Signed)
Assessment: Patient currently stable and managed well on Eliquis  Plan: Continue Eliquis as prescribed 90-day prescription sent into pharmacy Follow-up in 3 months with Dr. Sood 

## 2018-03-12 NOTE — Assessment & Plan Note (Addendum)
Assessment: Significant improvement following last office visit after treatment of IV antibiotics and discharged on p.o. antibiotics  Plan: -Continue follow-up with tooth being pulled on 03/14/2018 -Follow-up with primary care regarding her need for neck CT with contrast based off of radiology's recommendations with your CT maxillofacial performed in the emergency room -We have contacted patient's primary care office to notify them of the recommendation of a CT neck this will be discussed at her next primary care office visit

## 2018-03-12 NOTE — Assessment & Plan Note (Deleted)
Assessment: Patient currently stable and managed well on Eliquis  Plan: Continue Eliquis as prescribed 90-day prescription sent into pharmacy Follow-up in 3 months with Dr. Craige Cotta

## 2018-03-12 NOTE — Progress Notes (Signed)
Reviewed and agree with assessment/plan.   Takari Lundahl, MD [facility] 02/24/2016, 12:24 PM Pager:  [phone]  

## 2018-06-18 ENCOUNTER — Ambulatory Visit: Payer: Medicare Other | Admitting: Pulmonary Disease

## 2018-06-18 IMAGING — DX DG CHEST 2V
2 series · 2 of 2 positions shown · non-contrast
Comparison: 01/11/2016

CLINICAL DATA: Pleural effusion, thoracentesis, pulmonary embolism

EXAM:
CHEST  2 VIEW

[chest pa]
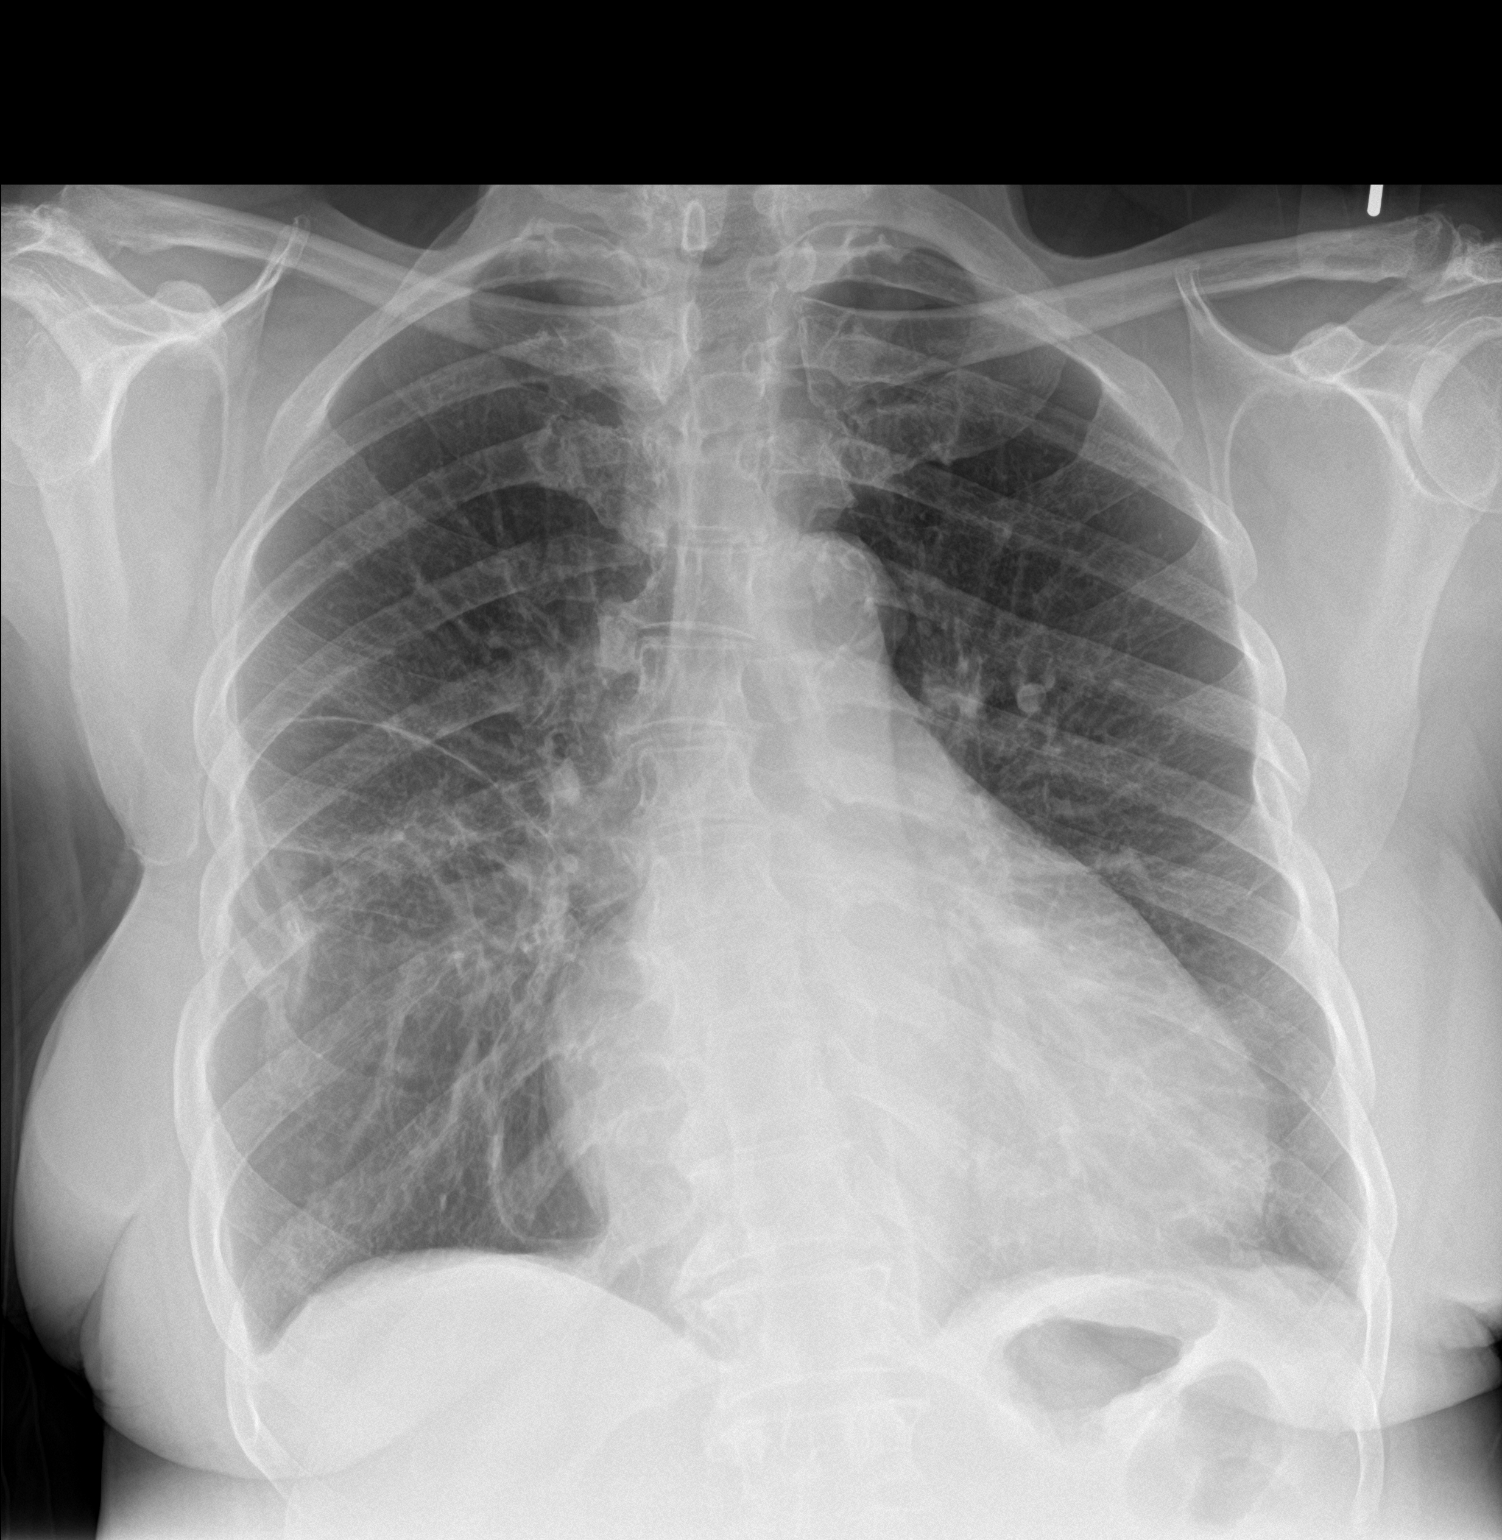

[chest lat]
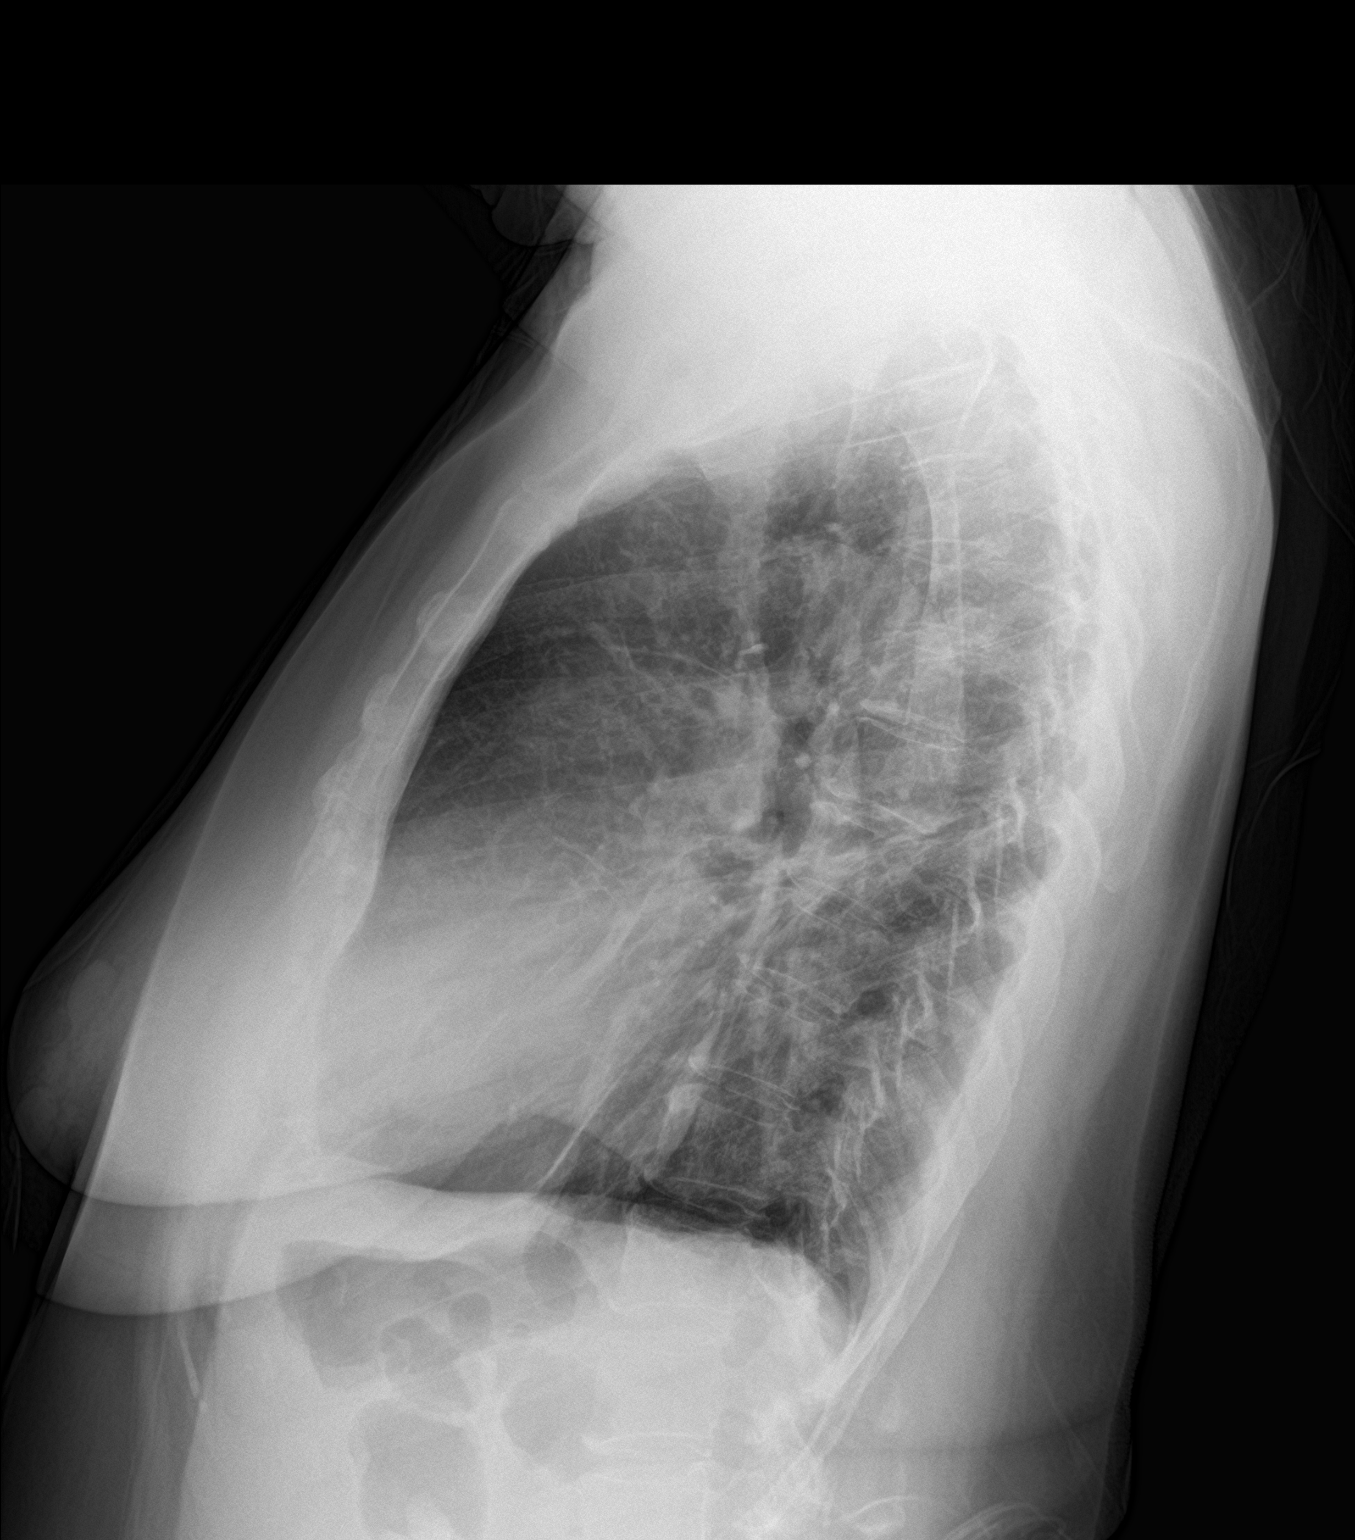

[2 of 2 positions shown; findings below may reference images not displayed]

FINDINGS: Enlargement of cardiac silhouette.

Atherosclerotic calcification aorta.

Mediastinal contours and pulmonary vascularity normal.

Scarring and question postsurgical changes RIGHT mid lung stable.

Near complete resolution of previously seen RIGHT pleural effusion.

No definite acute infiltrate, LEFT pleural effusion, or
pneumothorax.
IMPRESSION: Near complete resolution of RIGHT pleural effusion post
thoracentesis without evidence of pneumothorax.

RIGHT lung scarring.

Aortic atherosclerosis.

Enlargement of cardiac silhouette.

## 2018-07-10 ENCOUNTER — Ambulatory Visit: Payer: Medicare Other | Admitting: Pulmonary Disease

## 2018-07-10 ENCOUNTER — Ambulatory Visit (INDEPENDENT_AMBULATORY_CARE_PROVIDER_SITE_OTHER): Payer: Medicare Other | Admitting: Pulmonary Disease

## 2018-07-10 ENCOUNTER — Other Ambulatory Visit: Payer: Self-pay

## 2018-07-10 ENCOUNTER — Encounter: Payer: Self-pay | Admitting: Pulmonary Disease

## 2018-07-10 VITALS — BP 118/58 | HR 83 | Temp 97.9°F | Ht 62.0 in | Wt 152.2 lb

## 2018-07-10 DIAGNOSIS — Z79899 Other long term (current) drug therapy: Secondary | ICD-10-CM

## 2018-07-10 DIAGNOSIS — I2602 Saddle embolus of pulmonary artery with acute cor pulmonale: Secondary | ICD-10-CM

## 2018-07-10 DIAGNOSIS — R9389 Abnormal findings on diagnostic imaging of other specified body structures: Secondary | ICD-10-CM

## 2018-07-10 NOTE — Patient Instructions (Addendum)
Continue the Eliquis as prescribed  Please also fill out patient assistance paperwork for Eliquis to see if we can get this for you at a lower cost  Please follow-up with your primary care provider regarding your CT that was performed in the emergency room >>> Radiology is recommending you receive a neck CT with contrast following the resolution of your cellulitis I believe best for your PCP to order this to follow   Return in about 6 months (around 01/10/2019), or if symptoms worsen or fail to improve, for Follow up with Dr. Craige CottaSood.   Coronavirus (COVID-19) Are you at risk?  Are you at risk for the Coronavirus (COVID-19)?  To be considered HIGH RISK for Coronavirus (COVID-19), you have to meet the following criteria:  . Traveled to [place], [place], [place], [place] or [place]; or in the [place] to [place], [place], [place], or [place]; and have fever, cough, and shortness of breath within the last 2 weeks of travel OR . Been in close contact with a person diagnosed with COVID-19 within the last 2 weeks and have fever, cough, and shortness of breath . IF YOU DO NOT MEET THESE CRITERIA, YOU ARE CONSIDERED LOW RISK FOR COVID-19.  What to do if you are HIGH RISK for COVID-19?  Marland Kitchen. If you are having a medical emergency, call 911. . Seek medical care right away. Before you go to a doctor's office, urgent care or emergency department, call ahead and tell them about your recent travel, contact with someone diagnosed with COVID-19, and your symptoms. You should receive instructions from your physician's office regarding next steps of care.  . When you arrive at healthcare provider, tell the healthcare staff immediately you have returned from visiting [place], [place], [place], [place] or [place]; or traveled in the [place] to [place], [place], [place], or [place]; in the last two weeks or you have been in close contact with a person diagnosed with COVID-19 in the last 2  weeks.   . Tell the health care staff about your symptoms: fever, cough and shortness of breath. . After you have been seen by a medical provider, you will be either: o Tested for (COVID-19) and discharged home on quarantine except to seek medical care if symptoms worsen, and asked to  - Stay home and avoid contact with others until you get your results (4-5 days)  - Avoid travel on public transportation if possible (such as bus, train, or airplane) or o Sent to the Emergency Department by EMS for evaluation, COVID-19 testing, and possible admission depending on your condition and test results.  What to do if you are LOW RISK for COVID-19?  Reduce your risk of any infection by using the same precautions used for avoiding the common cold or flu:  Marland Kitchen. Wash your hands often with soap and warm water for at least 20 seconds.  If soap and water are not readily available, use an alcohol-based hand sanitizer with at least 60% alcohol.  . If coughing or sneezing, cover your mouth and nose by coughing or sneezing into the elbow areas of your shirt or coat, into a tissue or into your sleeve (not your hands). . Avoid shaking hands with others and consider head nods or verbal greetings only. . Avoid touching your eyes, nose, or mouth with unwashed hands.  . Avoid close contact with people who are sick. . Avoid places or events with large numbers of people in one location, like concerts  or sporting events. . Carefully consider travel plans you have or are making. . If you are planning any travel outside or inside the [place], visit the CDC's Travelers' Health webpage for the latest health notices. . If you have some symptoms but not all symptoms, continue to monitor at home and seek medical attention if your symptoms worsen. . If you are having a medical emergency, call 911.   ADDITIONAL HEALTHCARE OPTIONS FOR PATIENTS  [facility] / e-Visit: [email or web address]          [facility]: [phone]  [facility]: [phone]                   [facility]: [phone]           It is flu season:   >>> Best ways to protect herself from the flu: Receive the yearly flu vaccine, practice good hand hygiene washing with soap and also using hand sanitizer when available, eat a nutritious meals, get adequate rest, hydrate appropriately   Please contact the office if your symptoms worsen or you have concerns that you are not improving.   Thank you for choosing [facility] for your healthcare, and for allowing [place] to partner with you on your healthcare journey. I am thankful to be able to provide care to you today.   Elisha Headland FNP-C

## 2018-07-10 NOTE — Assessment & Plan Note (Signed)
Assessment: Patient struggling with cost of Eliquis Patient reports that due to her insurance changing in 2020 the cost went up from $90 for 90-day supply $241 for 90-day supply  Plan: Patient assistance forms printed for patient today Patient to apply for patient assistance program

## 2018-07-10 NOTE — Progress Notes (Deleted)
@Patient  ID: Glenda Blankenship, female    DOB: Jul 15, 1940, 78 y.o.   MRN: [id number]  No chief complaint on file.   Referring provider: Carmel Sacramento, NP  HPI: 78 year old female, former smoker. PMH saddle PE, DVT (on Eliquis), type 2 diabetes, NSTEMI, HTN, CKD stage 3. Patient of Dr. Craige Cotta, last seen by pulmonary NP on 03/12/18.   07/10/2018 Patient presents today for 3 months follow-up.     No Known Allergies  Immunization History  Administered Date(s) Administered  . Influenza, High Dose Seasonal PF 03/12/2018  . Influenza-Unspecified 12/29/2012, 12/07/2015    Past Medical History:  Diagnosis Date  . Diabetes (HCC)   . DVT (deep vein thrombosis) in pregnancy   . Hypertension   . Pneumonia   . Renal disorder     Tobacco History: Social History   Tobacco Use  Smoking Status Former Smoker  . Types: Cigarettes  Smokeless Tobacco Never Used  Tobacco Comment   quit "so loing ago" not give me a date   Counseling given: Not Answered Comment: quit "so loing ago" not give me a date   Outpatient Medications Prior to Visit  Medication Sig Dispense Refill  . acetaminophen (TYLENOL) 325 MG tablet Take 2 tablets (650 mg total) by mouth every 6 (six) hours as needed for mild pain, fever or headache. (Patient not taking: Reported on 03/12/2018)    . apixaban (ELIQUIS) 5 MG TABS tablet Take 1 tablet (5 mg total) by mouth 2 (two) times daily. 180 tablet 2  . docusate sodium (COLACE) 100 MG capsule Take 1 capsule (100 mg total) by mouth 2 (two) times daily. (Patient not taking: Reported on 02/19/2018) 30 capsule 0  . ferrous sulfate 325 (65 FE) MG tablet Take 1 tablet (325 mg total) by mouth 2 (two) times daily with a meal. (Patient not taking: Reported on 03/12/2018) 60 tablet 0  . hydrocerin (EUCERIN) CREA Apply 1 application topically daily. Apply to areas of skin rash on face, neck, arm. (Patient not taking: Reported on 02/19/2018) 113 g 0   No facility-administered medications  prior to visit.       Review of Systems  Review of Systems   Physical Exam  LMP  (LMP Unknown)  Physical Exam   Lab Results:  CBC    Component Value Date/Time   WBC 8.0 02/19/2018 1842   RBC 3.50 (L) 02/19/2018 1842   HGB 9.3 (L) 02/19/2018 1842   HCT 32.0 (L) 02/19/2018 1842   PLT 335 02/19/2018 1842   MCV 91.4 02/19/2018 1842   MCH 26.6 02/19/2018 1842   MCHC 29.1 (L) 02/19/2018 1842   RDW 17.0 (H) 02/19/2018 1842   LYMPHSABS 2.0 02/19/2018 1842   MONOABS 0.9 02/19/2018 1842   EOSABS 0.0 02/19/2018 1842   BASOSABS 0.0 02/19/2018 1842    BMET    Component Value Date/Time   NA 138 02/19/2018 1842   K 4.1 02/19/2018 1842   CL 101 02/19/2018 1842   CO2 28 02/19/2018 1842   GLUCOSE 104 (H) 02/19/2018 1842   BUN 12 02/19/2018 1842   CREATININE 0.96 02/19/2018 1842   CALCIUM 9.3 02/19/2018 1842   GFRNONAA 57 (L) 02/19/2018 1842   GFRAA >60 02/19/2018 1842    BNP No results found for: BNP  ProBNP No results found for: PROBNP  Imaging: No results found.   Assessment & Plan:   No problem-specific Assessment & Plan notes found for this encounter.     Glenford Bayley, NP  07/10/2018  

## 2018-07-10 NOTE — Telephone Encounter (Signed)
07/10/2018 1218  Glenda Blankenship can we please contact patient's primary care provider.  To let them know the patient still needs to complete the follow-up CT that was recommended based off of December/2019 maxillofacial CT.  I let the patient know that she probably needs a follow-up appointment with her PCP as well as lab work prior to getting this completed as the CT recommended by radiology does require contrast.  Patient's facial cellulitis is completely resolved she has had a tooth removed.  Elisha Headland, FNP

## 2018-07-10 NOTE — Assessment & Plan Note (Signed)
Assessment: December/2019 CT maxillofacial recommended follow-up CT with IV contrast to follow cystic mass Patient still has not completed this Primary care has not followed up with [place] despite being notified in January/2020  Plan: Patient to schedule appointment with primary care Patient to follow-up with primary care and ensure that she receives follow-up neck CT with IV contrast Follow-up with our office in 6 months

## 2018-07-10 NOTE — Assessment & Plan Note (Signed)
Assessment: Patient currently stable and managed well on Eliquis  Plan: Continue Eliquis as prescribed Patient assistance paperwork provided for Eliquis Follow-up with our office in 6 months

## 2018-07-10 NOTE — Progress Notes (Signed)
@Patient  ID: Glenda Blankenship, female    DOB: 1940/08/01, 78 y.o.   MRN: [id number]  Chief Complaint  Patient presents with  . Follow-up    3 mo follow up / hx of PE     Referring provider: Carmel Sacramento, NP  HPI:  78 year old female former smoker followed in our office for PE and DVT managed on Eliquis  PMH: Type 2 diabetes, NSTEMI/type II, demand ischemia in the setting of saddle PE and right ventricular strain, HTN, stage III chronic kidney disease, gout, Smoker/ Smoking History: Former Smoker  Maintenance: Eliquis for prevention of PE and DVTs Pt of: Dr. Craige Cotta   07/10/2018  - Visit   78 year old female former smoker followed in our office for history of PE.  Patient also also had history of DVTs.  She is managed on Eliquis.  Patient is followed by Dr. Craige Cotta.  Patient last seen in our office in January/2020 after being routed to the emergency room in December/2019 for a dental abscess.  Since January/2020 office visit patient has had a tooth removed.  Patient presents office today with daughter who reports that patient's breathing has been stable.  Denies hematuria, hemoptysis, blood in stool.  Patient denies any sort of acute worsening shortness of breath or wheezing.  She has no acute complaints at this time.  December/2019 maxillofacial CT did recommend follow-up CT with contrast.  This has not been completed yet.  Patient has not followed up with primary care.  Primary care office was notified in January/2020.  Patient reports she continues to take Eliquis.  Unfortunately the cost of her Eliquis has gone up.  He went from being around $90 for a 90-day supply to $141 for a 90-day supply.  Patient is wondering if there is any other way to get this medication and more affordable price.   Tests:   CT abd/pelvis 01/08/16 >> small b/l effusions, atherosclerosis Echo 01/11/16>> EF 65 to 70%, grade 1 DD, mod RV dilation, severe TR, PAS 52 mmHg, mild/mod pericardial effusion Lt thoracentesis  01/11/16 >> glucose 155, protein < 3, LDH 138, WBC 758 (50L, 42M, 11E) CTA Chest 01/12/16>> acute saddle PE with evidence of RV strain (ratio 1:1), small pleural effusions, partially loculated on R, small pericardial effusion B/l LE Duplex 01/13/16 >> DVT Rt popliteal, posterior tibial veins Echo 04/27/16 >> EF 60 to 65%, PAS 35 mmHg Hypercoagulable panel 08/15/16 >> Beta 2 Glycoprotein IgA 34, anticardiolipin IgG 21, anticardiolipin IgM 36 Lower extremity doppler 08/25/16 >> no DVT  02/19/2018-CT maxillofacial with contrast- odontogenic left facial cellulitis suspected with multifocal bilateral carious dentition but-discrete dental source identified, reactive level 1 and level 2 lymphadenopathy, confluent soft tissue edema but no abscess or drainable fluid collection, secondary inflammation of the left parapharyngeal and submandibular spaces, abnormal left parotid gland is favored to be unrelated, 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  Imaging: No results found.    Specialty Problems      Pulmonary Problems   Pleural effusion, right      No Known Allergies  Immunization History  Administered Date(s) Administered  . Influenza, High Dose Seasonal PF 03/12/2018  . Influenza-Unspecified 12/29/2012, 12/07/2015    Past Medical History:  Diagnosis Date  . Diabetes (HCC)   . DVT (deep vein thrombosis)  in pregnancy   . Hypertension   . Pneumonia   . Renal disorder     Tobacco History: Social History   Tobacco Use  Smoking Status Former Smoker  . Packs/day: 0.50  . Types: Cigarettes  . Start date: 07/09/1956  Smokeless Tobacco Never Used  Tobacco Comment   quit "so loing ago" not give me a date   Counseling given: Yes Comment: quit "so loing ago" not give  me a date   Outpatient Encounter Medications as of 07/10/2018  Medication Sig  . acetaminophen (TYLENOL) 325 MG tablet Take 2 tablets (650 mg total) by mouth every 6 (six) hours as needed for mild pain, fever or headache.  Marland Kitchen apixaban (ELIQUIS) 5 MG TABS tablet Take 1 tablet (5 mg total) by mouth 2 (two) times daily.  Marland Kitchen docusate sodium (COLACE) 100 MG capsule Take 1 capsule (100 mg total) by mouth 2 (two) times daily.  . ferrous sulfate 325 (65 FE) MG tablet Take 1 tablet (325 mg total) by mouth 2 (two) times daily with a meal.  . hydrocerin (EUCERIN) CREA Apply 1 application topically daily. Apply to areas of skin rash on face, neck, arm.   No facility-administered encounter medications on file as of 07/10/2018.     Review of Systems  Review of Systems  Constitutional: Negative for chills, fatigue, fever and unexpected weight change.  HENT: Negative for congestion, ear pain, postnasal drip, sinus pressure and sinus pain.   Respiratory: Negative for cough, chest tightness, shortness of breath and wheezing.   Cardiovascular: Negative for chest pain and palpitations.  Gastrointestinal: Negative for blood in stool, diarrhea, nausea and vomiting.  Genitourinary: Negative for hematuria and urgency.  Musculoskeletal: Negative for arthralgias.  Skin: Negative for color change.  Allergic/Immunologic: Negative for environmental allergies and food allergies.  Neurological: Negative for dizziness, light-headedness and headaches.  Psychiatric/Behavioral: Negative for dysphoric mood. The patient is not nervous/anxious.   All other systems reviewed and are negative.    Physical Exam  BP (!) 118/58 (BP Location: Left Arm, Cuff Size: Normal)   Pulse 83   Temp 97.9 F (36.6 C) (Oral)   Ht  (1.575 m)   Wt 152 lb 3.2 oz (69 kg)   LMP  (LMP Unknown)   SpO2 96%   BMI 27.84 kg/m   Wt Readings from Last 5 Encounters:  07/10/18 152 lb 3.2 oz (69 kg)  03/12/18 142 lb 3.2 oz (64.5 kg)   02/19/18 139 lb 9.6 oz (63.3 kg)  01/26/18 140 lb (63.5 kg)  12/15/16 155 lb (70.3 kg)     Physical Exam  Constitutional: She is oriented to person, place, and time and well-developed, well-nourished, and in no distress. No distress.  HENT:  Head: Normocephalic and atraumatic.  Right Ear: Hearing, tympanic membrane, external ear and ear canal normal.  Left Ear: Hearing, tympanic membrane, external ear and ear canal normal.  Nose: Nose normal.  Mouth/Throat: Uvula is midline and oropharynx is clear and moist. No oropharyngeal exudate.  Poor dentition  Eyes: Pupils are equal, round, and reactive to light.  Neck: Normal range of motion. Neck supple.  Cardiovascular: Normal rate, regular rhythm and normal heart sounds.  Pulmonary/Chest: Effort normal and breath sounds normal. No accessory muscle usage. No respiratory distress. She has no decreased breath sounds. She has no wheezes. She has no rhonchi.  Abdominal: Soft. Bowel sounds are normal. She exhibits no distension. There is no abdominal tenderness.  Musculoskeletal: Normal range of motion.  General: No edema.  Lymphadenopathy:    She has no cervical adenopathy.  Neurological: She is alert and oriented to person, place, and time. Gait normal.  Skin: Skin is warm and dry. She is not diaphoretic. No erythema.  Psychiatric: Mood, memory, affect and judgment normal.  Nursing note and vitals reviewed.     Lab Results:  CBC    Component Value Date/Time   WBC 8.0 02/19/2018 1842   RBC 3.50 (L) 02/19/2018 1842   HGB 9.3 (L) 02/19/2018 1842   HCT 32.0 (L) 02/19/2018 1842   PLT 335 02/19/2018 1842   MCV 91.4 02/19/2018 1842   MCH 26.6 02/19/2018 1842   MCHC 29.1 (L) 02/19/2018 1842   RDW 17.0 (H) 02/19/2018 1842   LYMPHSABS 2.0 02/19/2018 1842   MONOABS 0.9 02/19/2018 1842   EOSABS 0.0 02/19/2018 1842   BASOSABS 0.0 02/19/2018 1842    BMET    Component Value Date/Time   NA 138 02/19/2018 1842   K 4.1 02/19/2018  1842   CL 101 02/19/2018 1842   CO2 28 02/19/2018 1842   GLUCOSE 104 (H) 02/19/2018 1842   BUN 12 02/19/2018 1842   CREATININE 0.96 02/19/2018 1842   CALCIUM 9.3 02/19/2018 1842   GFRNONAA 57 (L) 02/19/2018 1842   GFRAA >60 02/19/2018 1842    BNP No results found for: BNP  ProBNP No results found for: PROBNP    Assessment & Plan:     Acute saddle pulmonary embolism with acute cor pulmonale (HCC) Assessment: Patient currently stable and managed well on Eliquis  Plan: Continue Eliquis as prescribed Patient assistance paperwork provided for Eliquis Follow-up with our office in 6 months  Abnormal CT scan Assessment: December/2019 CT maxillofacial recommended follow-up CT with IV contrast to follow cystic mass Patient still has not completed this Primary care has not followed up with us despite being notified in January/2020  Plan: Patient to schedule appointment with primary care Patient to follow-up with primary care and ensure that she receives follow-up neck CT with IV contrast Follow-up with our office in 6 months  Medication management Assessment: Patient struggling with cost of Eliquis Patient reports that due to her insurance changing in 2020 the cost went up from $90 for 90-day supply $241 for 90-day supply  Plan: Patient assistance forms printed for patient today Patient to apply for patient assistance program     Coral CeoBrian P , NP 07/10/2018   This appointment was 25 min long with over 50% of the time in direct face-to-face patient care, assessment, plan of care, and follow-up.

## 2018-07-13 NOTE — Telephone Encounter (Signed)
Called PCP office in regards to CT still not done as a follow up from the CT patient had in December 2019. CMA at office states PCP is in office all evening and she would send message to him to follow up on.   Nothing further needed

## 2018-10-09 NOTE — Progress Notes (Signed)
Reviewed and agree with assessment/plan.   Maimouna Rondeau, MD [facility] 02/24/2016, 12:24 PM Pager:  [phone]  

## 2018-10-16 LAB — HM DIABETES EYE EXAM

## 2018-10-25 ENCOUNTER — Encounter: Payer: Self-pay | Admitting: Nurse Practitioner

## 2018-11-08 ENCOUNTER — Encounter: Payer: Self-pay | Admitting: Internal Medicine

## 2018-12-31 ENCOUNTER — Other Ambulatory Visit: Payer: Self-pay | Admitting: Pulmonary Disease

## 2018-12-31 DIAGNOSIS — I2602 Saddle embolus of pulmonary artery with acute cor pulmonale: Secondary | ICD-10-CM

## 2019-04-29 ENCOUNTER — Other Ambulatory Visit: Payer: Self-pay | Admitting: Pulmonary Disease

## 2019-04-29 DIAGNOSIS — I2602 Saddle embolus of pulmonary artery with acute cor pulmonale: Secondary | ICD-10-CM

## 2019-04-29 NOTE — Telephone Encounter (Signed)
Patient was last seen back on 07/10/2018 by Arlys John and was advised to follow up in 6 months (around 01/10/2019). She did not follow up but requested a refill on Eliquis back in November 2020 for 3 months.   She is seeking another refill. I attempted to call the patient to get her scheduled for a follow up but she did not answer. Her VM is not setup so I could not leave a message.   Will attempt to call her back later.

## 2019-05-30 DIAGNOSIS — R569 Unspecified convulsions: Secondary | ICD-10-CM

## 2019-05-30 HISTORY — DX: Unspecified convulsions: R56.9

## 2019-06-01 ENCOUNTER — Other Ambulatory Visit: Payer: Self-pay | Admitting: Pulmonary Disease

## 2019-06-01 DIAGNOSIS — I2602 Saddle embolus of pulmonary artery with acute cor pulmonale: Secondary | ICD-10-CM

## 2019-06-17 ENCOUNTER — Emergency Department (HOSPITAL_COMMUNITY): Payer: Medicare Other

## 2019-06-17 ENCOUNTER — Other Ambulatory Visit: Payer: Self-pay

## 2019-06-17 ENCOUNTER — Inpatient Hospital Stay (HOSPITAL_COMMUNITY)
Admission: EM | Admit: 2019-06-17 | Discharge: 2019-06-20 | DRG: 100 | Disposition: A | Discharge status: Home Health | Payer: Medicare Other | Attending: Internal Medicine | Admitting: Internal Medicine

## 2019-06-17 DIAGNOSIS — Z86711 Personal history of pulmonary embolism: Secondary | ICD-10-CM

## 2019-06-17 DIAGNOSIS — I6783 Posterior reversible encephalopathy syndrome: Secondary | ICD-10-CM | POA: Diagnosis present

## 2019-06-17 DIAGNOSIS — Z20822 Contact with and (suspected) exposure to covid-19: Secondary | ICD-10-CM | POA: Diagnosis present

## 2019-06-17 DIAGNOSIS — R269 Unspecified abnormalities of gait and mobility: Secondary | ICD-10-CM | POA: Diagnosis present

## 2019-06-17 DIAGNOSIS — N183 Chronic kidney disease, stage 3 unspecified: Secondary | ICD-10-CM | POA: Diagnosis present

## 2019-06-17 DIAGNOSIS — Z91138 Patient's unintentional underdosing of medication regimen for other reason: Secondary | ICD-10-CM | POA: Diagnosis not present

## 2019-06-17 DIAGNOSIS — E1165 Type 2 diabetes mellitus with hyperglycemia: Secondary | ICD-10-CM | POA: Diagnosis not present

## 2019-06-17 DIAGNOSIS — I10 Essential (primary) hypertension: Secondary | ICD-10-CM | POA: Diagnosis not present

## 2019-06-17 DIAGNOSIS — E1122 Type 2 diabetes mellitus with diabetic chronic kidney disease: Secondary | ICD-10-CM | POA: Diagnosis present

## 2019-06-17 DIAGNOSIS — N1831 Chronic kidney disease, stage 3a: Secondary | ICD-10-CM

## 2019-06-17 DIAGNOSIS — T464X6A Underdosing of angiotensin-converting-enzyme inhibitors, initial encounter: Secondary | ICD-10-CM | POA: Diagnosis present

## 2019-06-17 DIAGNOSIS — I161 Hypertensive emergency: Secondary | ICD-10-CM | POA: Diagnosis present

## 2019-06-17 DIAGNOSIS — Z87891 Personal history of nicotine dependence: Secondary | ICD-10-CM | POA: Diagnosis not present

## 2019-06-17 DIAGNOSIS — D649 Anemia, unspecified: Secondary | ICD-10-CM | POA: Diagnosis present

## 2019-06-17 DIAGNOSIS — Z86718 Personal history of other venous thrombosis and embolism: Secondary | ICD-10-CM | POA: Diagnosis not present

## 2019-06-17 DIAGNOSIS — G40901 Epilepsy, unspecified, not intractable, with status epilepticus: Secondary | ICD-10-CM | POA: Diagnosis present

## 2019-06-17 DIAGNOSIS — H47619 Cortical blindness, unspecified side of brain: Secondary | ICD-10-CM | POA: Diagnosis present

## 2019-06-17 DIAGNOSIS — E8809 Other disorders of plasma-protein metabolism, not elsewhere classified: Secondary | ICD-10-CM | POA: Diagnosis present

## 2019-06-17 DIAGNOSIS — Z7901 Long term (current) use of anticoagulants: Secondary | ICD-10-CM

## 2019-06-17 DIAGNOSIS — E119 Type 2 diabetes mellitus without complications: Secondary | ICD-10-CM

## 2019-06-17 DIAGNOSIS — R569 Unspecified convulsions: Secondary | ICD-10-CM

## 2019-06-17 DIAGNOSIS — G40801 Other epilepsy, not intractable, with status epilepticus: Principal | ICD-10-CM | POA: Diagnosis present

## 2019-06-17 DIAGNOSIS — I129 Hypertensive chronic kidney disease with stage 1 through stage 4 chronic kidney disease, or unspecified chronic kidney disease: Secondary | ICD-10-CM | POA: Diagnosis present

## 2019-06-17 DIAGNOSIS — I2699 Other pulmonary embolism without acute cor pulmonale: Secondary | ICD-10-CM | POA: Diagnosis present

## 2019-06-17 LAB — COMPREHENSIVE METABOLIC PANEL
ALT: 18 U/L (ref 0–44)
AST: 31 U/L (ref 15–41)
Albumin: 3.4 g/dL — ABNORMAL LOW (ref 3.5–5.0)
Alkaline Phosphatase: 55 U/L (ref 38–126)
Anion gap: 10 (ref 5–15)
BUN: 12 mg/dL (ref 8–23)
CO2: 23 mmol/L (ref 22–32)
Calcium: 8.7 mg/dL — ABNORMAL LOW (ref 8.9–10.3)
Chloride: 105 mmol/L (ref 98–111)
Creatinine, Ser: 0.84 mg/dL (ref 0.44–1.00)
GFR calc Af Amer: >60 mL/min (ref >60)
GFR calc non Af Amer: >60 mL/min (ref >60)
Glucose, Bld: 103 mg/dL — ABNORMAL HIGH (ref 70–99)
Potassium: 4.2 mmol/L (ref 3.5–5.1)
Sodium: 138 mmol/L (ref 135–145)
Total Bilirubin: 0.2 mg/dL — ABNORMAL LOW (ref 0.3–1.2)
Total Protein: 7 g/dL (ref 6.5–8.1)

## 2019-06-17 LAB — I-STAT CHEM 8, ED
BUN: 15 mg/dL (ref 8–23)
Calcium, Ion: 1.1 mmol/L — ABNORMAL LOW (ref 1.15–1.40)
Chloride: 104 mmol/L (ref 98–111)
Creatinine, Ser: 0.8 mg/dL (ref 0.44–1.00)
Glucose, Bld: 99 mg/dL (ref 70–99)
HCT: 34 % — ABNORMAL LOW (ref 36.0–46.0)
Hemoglobin: 11.6 g/dL — ABNORMAL LOW (ref 12.0–15.0)
Potassium: 4.2 mmol/L (ref 3.5–5.1)
Sodium: 139 mmol/L (ref 135–145)
TCO2: 29 mmol/L (ref 22–32)

## 2019-06-17 LAB — CBC WITH DIFFERENTIAL/PLATELET
Abs Immature Granulocytes: 0.06 10*3/uL (ref 0.00–0.07)
Basophils Absolute: 0 10*3/uL (ref 0.0–0.1)
Basophils Relative: 0 %
Eosinophils Absolute: 0.1 10*3/uL (ref 0.0–0.5)
Eosinophils Relative: 2 %
HCT: 36.3 % (ref 36.0–46.0)
Hemoglobin: 10.9 g/dL — ABNORMAL LOW (ref 12.0–15.0)
Immature Granulocytes: 1 %
Lymphocytes Relative: 23 %
Lymphs Abs: 1.5 10*3/uL (ref 0.7–4.0)
MCH: 27 pg (ref 26.0–34.0)
MCHC: 30 g/dL (ref 30.0–36.0)
MCV: 89.9 fL (ref 80.0–100.0)
Monocytes Absolute: 0.4 10*3/uL (ref 0.1–1.0)
Monocytes Relative: 6 %
Neutro Abs: 4.3 10*3/uL (ref 1.7–7.7)
Neutrophils Relative %: 68 %
Platelets: 248 10*3/uL (ref 150–400)
RBC: 4.04 MIL/uL (ref 3.87–5.11)
RDW: 14.3 % (ref 11.5–15.5)
WBC: 6.4 10*3/uL (ref 4.0–10.5)
nRBC: 0 % (ref 0.0–0.2)

## 2019-06-17 LAB — VITAMIN B12: Vitamin B-12: 235 pg/mL (ref 180–914)

## 2019-06-17 LAB — FOLATE: Folate: 14.2 ng/mL (ref >5.9)

## 2019-06-17 LAB — C-REACTIVE PROTEIN: CRP: <0.5 mg/dL (ref <1.0)

## 2019-06-17 MED ORDER — ACETAMINOPHEN 500 MG PO TABS
500.0000 mg | ORAL_TABLET | Freq: Once | ORAL | Status: AC
  Administered 2019-06-17: 500 mg via ORAL
  Filled 2019-06-17: qty 1

## 2019-06-17 MED ORDER — LORAZEPAM BOLUS VIA INFUSION: 2.0000 mg | INTRAVENOUS | Status: DC | PRN

## 2019-06-17 MED ORDER — LORAZEPAM 2 MG/ML IJ SOLN
INTRAMUSCULAR | Status: AC
  Filled 2019-06-17: qty 2

## 2019-06-17 MED ORDER — LORAZEPAM 2 MG/ML IJ SOLN
INTRAMUSCULAR | Status: AC
  Administered 2019-06-17: 2 mg
  Filled 2019-06-17: qty 1

## 2019-06-17 MED ORDER — LORAZEPAM 2 MG/ML IJ SOLN: 2.0000 mg | Freq: Once | INTRAMUSCULAR | Status: DC

## 2019-06-17 MED ORDER — LORAZEPAM 2 MG/ML IJ SOLN
1.0000 mg | Freq: Once | INTRAMUSCULAR | Status: AC
  Administered 2019-06-17: 18:00:00 1 mg via INTRAVENOUS
  Filled 2019-06-17: qty 1

## 2019-06-17 MED ORDER — LEVETIRACETAM IN NACL 500 MG/100ML IV SOLN
500.0000 mg | Freq: Two times a day (BID) | INTRAVENOUS | Status: DC
  Administered 2019-06-18 – 2019-06-19 (×3): 500 mg via INTRAVENOUS
  Filled 2019-06-17 (×4): qty 100

## 2019-06-17 MED ORDER — SODIUM CHLORIDE 0.9 % IV SOLN
2000.0000 mg | Freq: Once | INTRAVENOUS | Status: AC
  Administered 2019-06-17: 2000 mg via INTRAVENOUS
  Filled 2019-06-17: qty 20

## 2019-06-17 MED ORDER — LORAZEPAM 2 MG/ML IJ SOLN: 2.0000 mg | INTRAMUSCULAR | Status: DC | PRN

## 2019-06-17 MED ORDER — LORAZEPAM 2 MG/ML IJ SOLN
2.0000 mg | Freq: Once | INTRAMUSCULAR | Status: AC
  Administered 2019-06-17: 21:00:00 2 mg via INTRAVENOUS
  Filled 2019-06-17: qty 1

## 2019-06-17 MED ORDER — LABETALOL HCL 5 MG/ML IV SOLN
10.0000 mg | Freq: Once | INTRAVENOUS | Status: AC
  Administered 2019-06-17: 10 mg via INTRAVENOUS
  Filled 2019-06-17: qty 4

## 2019-06-17 NOTE — ED Notes (Signed)
Lab called, blue top hemolyzed, need to re-draw, lab put in new order.   Also, sent gold top tubes x 2 down to lab.

## 2019-06-17 NOTE — ED Notes (Signed)
During EEG, patient experienced a seizure witnessed by the family and the EEG tech. Reported lower extremity movement. Neuro and ED MD notified, ativan ordered, labetalol, and keppra ordered STAT.

## 2019-06-17 NOTE — Procedures (Addendum)
Patient Name: Glenda Blankenship  MRN: [id number]  Epilepsy Attending: Charlsie Quest  Referring Physician/Provider: Dr Caryl Pina Date: 06/17/2019 Duration: 37.49 mins  Patient history: 79yo F presented with ams and seizure. EEG to evaluate for status epilepticus.   Level of alertness: lethargic  AEDs during EEG study: Ativan, Keppra  Technical aspects: This EEG study was done with scalp electrodes positioned according to the 10-20 International system of electrode placement. Electrical activity was acquired at a sampling rate of 500Hz  and reviewed with a high frequency filter of 70Hz  and a low frequency filter of 1Hz . EEG data were recorded continuously and digitally stored.   DESCRIPTION: At the beginning of the study, eeg showed continuous generalized and maximal left posterior quadrant 3-5hz  rhythmic theta -delta slowing with overriding 13-15hz  beta activity. Sharp waves were also seen in bilateral, left>right posterior quadrant at 2-2.5hz . Clinically patient was noted to have bilateral leg jerking as well as gaze deviation to left and then right. IV Ativan 2mg  was administered at 2119. After IV ativan , the rhythmic slowing slowly resolved by around 2134 and was replaced by low amplitude generalized 2-3 Hz delta slowing admixed with 13-15hz  beta activity. Hyperventilation and photic stimulation were not performed.  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 After IV ativan, seizure stopped at around 2134 and EEG was suggestive of severe diffuse encephalopathy, non specific to etiology but likely secondary to sedation.   Dr was notified.   Glenda Blankenship [id number]

## 2019-06-17 NOTE — ED Notes (Signed)
Pt incontinent of large amount of urine.  Bed sheets and gown changed.  Purewick applied, advised Dr. Stevie Kern.

## 2019-06-17 NOTE — Progress Notes (Signed)
Pt had NIF of greater than -40 X 3.

## 2019-06-17 NOTE — H&P (Addendum)
History and Physical   Glenda Blankenship:[id number] DOB: Sep 05, 1940 DOA: 06/17/2019  Referring MD/NP/PA: Dr. Lenna Gilford  PCP: Carmel Sacramento, NP   Outpatient Specialists: Dr. Coralyn Helling, pulmonary  Patient coming from: Home  Chief Complaint: Seizures  HPI: Glenda Blankenship is a 79 y.o. female with medical history significant of pulmonary embolism with saddle embolus in May of last year, diabetes, hypertension, chronic kidney disease stage III, previous pneumonia presenting with progressive bilateral lower extremity numbness which progressed to full body tremors and seizure-like activities.  Symptoms started today at home.  Was progressive on her home couch.  She was brought to the ER where she had multiple other episodes.  Blood pressure was noted to be high at 170/100 by EMS.  In the ER it continues to stay high.  Patient was treated for seizure including Keppra.  She had EEG as well as head CT and MRI of the brain.  Neurology was consulted also.  Her tremors were controlled with Versed in the ER.  At this point patient appears to have either status epilepticus or PRES syndrome.  She is being admitted to the hospital with neurology follow-up.  She is fully awake and alert at the moment.  Symptoms have largely resolved.  ED Course: Temperature 98.6 blood pressure 211/91 pulse 104 respirate of 25 oxygen sats 91% on room air.  White count is 6.4 hemoglobin 11.6 platelets 248.  Chemistry largely within normal.  CRP is negative B12 and folic acid were within normal.  Head CT without contrast showed no acute abnormalities.  EEG showed slowing which could be acute ongoing seizure in the moment.  MRI of the brain showed multifocal abnormalities consistent with either status epilepticus or PR ES syndrome.  Patient is evaluated by neurology and will be admitted for further work-up.  Review of Systems: As per HPI otherwise 10 point review of systems negative.    Past Medical History:  Diagnosis Date  .  Diabetes (HCC)   . DVT (deep vein thrombosis) in pregnancy   . Hypertension   . Pneumonia   . Renal disorder     Past Surgical History:  Procedure Laterality Date  . kidney stone removal       reports that she has quit smoking. Her smoking use included cigarettes. She started smoking about 62 years ago. She smoked 0.50 packs per day. She has never used smokeless tobacco. She reports that she does not drink alcohol or use drugs.  No Known Allergies  No family history on file.   Prior to Admission medications   Medication Sig Start Date End Date Taking? Authorizing Provider  acetaminophen (TYLENOL) 325 MG tablet Take 2 tablets (650 mg total) by mouth every 6 (six) hours as needed for mild pain, fever or headache. 01/29/18   Hongalgi, Maximino Greenland, MD  docusate sodium (COLACE) 100 MG capsule Take 1 capsule (100 mg total) by mouth 2 (two) times daily. 01/29/18   Hongalgi, Maximino Greenland, MD  ELIQUIS 5 MG TABS tablet TAKE 1 TABLET BY MOUTH TWICE DAILY *( NEEDS OFFICE VISIT FOR FUTURE REFILLS )* ( NO MORE REFILLS UNTIL PATIENT HAS AN APPOINTMENT ) * 06/03/19   Coralyn Helling, MD  ferrous sulfate 325 (65 FE) MG tablet Take 1 tablet (325 mg total) by mouth 2 (two) times daily with a meal. 01/29/18   Hongalgi, Maximino Greenland, MD  hydrocerin (EUCERIN) CREA Apply 1 application topically daily. Apply to areas of skin rash on face, neck, arm. 01/29/18   Hongalgi,  Lenis Dickinson, MD    Physical Exam: Vitals:   06/17/19 2030 06/17/19 2100 06/17/19 2130 06/17/19 2154  BP: (!) 211/91 (!) 174/115 (!) 144/71   Pulse: 93 (!) 104 90   Resp: 20 (!) 25 18   Temp:    98.6 F (37 C)  TempSrc:    Oral  SpO2: 91% 94% 100%   Weight:      Height:          Constitutional: Awake and alert, slightly drowsy Vitals:   06/17/19 2030 06/17/19 2100 06/17/19 2130 06/17/19 2154  BP: (!) 211/91 (!) 174/115 (!) 144/71   Pulse: 93 (!) 104 90   Resp: 20 (!) 25 18   Temp:    98.6 F (37 C)  TempSrc:    Oral  SpO2: 91% 94% 100%   Weight:       Height:       Eyes: PERRL, lids and conjunctivae normal ENMT: Mucous membranes are moist. Posterior pharynx clear of any exudate or lesions.Normal dentition.  Neck: normal, supple, no masses, no thyromegaly Respiratory: clear to auscultation bilaterally, no wheezing, no crackles. Normal respiratory effort. No accessory muscle use.  Cardiovascular: Sinus tachycardia, no murmurs / rubs / gallops. No extremity edema. 2+ pedal pulses. No carotid bruits.  Abdomen: no tenderness, no masses palpated. No hepatosplenomegaly. Bowel sounds positive.  Musculoskeletal: no clubbing / cyanosis. No joint deformity upper and lower extremities. Good ROM, no contractures. Normal muscle tone.  Skin: no rashes, lesions, ulcers. No induration Neurologic: CN 2-12 grossly intact. Sensation intact, DTR normal. Strength 5/5 in all 4.  Psychiatric: Drowsy but arousable.     Labs on Admission: I have personally reviewed following labs and imaging studies  CBC: Recent Labs  Lab 06/17/19 1825 06/17/19 1834  WBC 6.4  --   NEUTROABS 4.3  --   HGB 10.9* 11.6*  HCT 36.3 34.0*  MCV 89.9  --   PLT 248  --    Basic Metabolic Panel: Recent Labs  Lab 06/17/19 1825 06/17/19 1834  NA 138 139  K 4.2 4.2  CL 105 104  CO2 23  --   GLUCOSE 103* 99  BUN 12 15  CREATININE 0.84 0.80  CALCIUM 8.7*  --    GFR: Estimated Creatinine Clearance: 52.4 mL/min (by C-G formula based on SCr of 0.8 mg/dL). Liver Function Tests: Recent Labs  Lab 06/17/19 1825  AST 31  ALT 18  ALKPHOS 55  BILITOT 0.2*  PROT 7.0  ALBUMIN 3.4*   No results for input(s): LIPASE, AMYLASE in the last 168 hours. No results for input(s): AMMONIA in the last 168 hours. Coagulation Profile: No results for input(s): INR, PROTIME in the last 168 hours. Cardiac Enzymes: No results for input(s): CKTOTAL, CKMB, CKMBINDEX, TROPONINI in the last 168 hours. BNP (last 3 results) No results for input(s): PROBNP in the last 8760 hours. HbA1C: No  results for input(s): HGBA1C in the last 72 hours. CBG: No results for input(s): GLUCAP in the last 168 hours. Lipid Profile: No results for input(s): CHOL, HDL, LDLCALC, TRIG, CHOLHDL, LDLDIRECT in the last 72 hours. Thyroid Function Tests: No results for input(s): TSH, T4TOTAL, FREET4, T3FREE, THYROIDAB in the last 72 hours. Anemia Panel: Recent Labs    06/17/19 1858  VITAMINB12 235  FOLATE 14.2   Urine analysis: No results found for: COLORURINE, APPEARANCEUR, LABSPEC, PHURINE, GLUCOSEU, HGBUR, BILIRUBINUR, KETONESUR, PROTEINUR, UROBILINOGEN, NITRITE, LEUKOCYTESUR Sepsis Labs: @LABRCNTIP (procalcitonin:4,lacticidven:4) )No results found for this or any previous visit (from the  past 240 hour(s)).   Radiological Exams on Admission: EEG  Result Date: 06/17/2019 Charlsie Quest, MD     06/17/2019  9:56 PM Patient Name: DAMIEN CISAR MRN: [id number] Epilepsy Attending: Charlsie Quest Referring Physician/Provider: Dr Sindy Messing Date: 06/17/2019 Duration: 37.49 mins Patient history: 79yo F presented with ams and seizure. EEG to evaluate for status epilepticus. Level of alertness: lethargic AEDs during EEG study: Ativan, Keppra Technical aspects: This EEG study was done with scalp electrodes positioned according to the 10-20 International system of electrode placement. Electrical activity was acquired at a sampling rate of 500Hz  and reviewed with a high frequency filter of 70Hz  and a low frequency filter of 1Hz . EEG data were recorded continuously and digitally stored. DESCRIPTION: At the beginning of the study, eeg showed continuous generalized and maximal left posterior quadrant 3-5hz  rhythmic theta -delta slowing with overriding 13-15hz  beta activity. Sharp waves were also seen in bilateral, left>right posterior quadrant at 2-2.5hz . Clinically patient was noted to have bilateral leg jerking as well as gaze deviation to left and then right. IV Ativan 2mg  was administered at 2119. After IV ativan  , the rhythmic slowing slowly resolved by around 2134 and was replaced by low amplitude generalized 2-3 Hz delta slowing admixed with 13-15hz  beta activity. Hyperventilation and photic stimulation were not performed. ABNORMALITY - Multifocal convulsive status epilepticus, bilateral (Left>Right) posterior quadrant - Continuous slow, generalized - Beta activity, generalized IMPRESSION: This study initially showed evidence of convulsive status epilepticus arising from bilateral posterior quadrant, left>right. After IV ativan, seizure stopped at around 2134 and EEG was suggestive of severe diffuse encephalopathy, non specific to etiology but likely secondary to sedation. Dr was notified. 2120   CT Head Wo Contrast  Result Date: 06/17/2019 CLINICAL DATA:  New onset seizure EXAM: CT HEAD WITHOUT CONTRAST TECHNIQUE: Contiguous axial images were obtained from the base of the skull through the vertex without intravenous contrast. COMPARISON:  02/19/2018 FINDINGS: Brain: No acute intracranial abnormality. Specifically, no hemorrhage, hydrocephalus, mass lesion, acute infarction, or significant intracranial injury. Vascular: No hyperdense vessel or unexpected calcification. Skull: No acute calvarial abnormality. Sinuses/Orbits: Visualized paranasal sinuses and mastoids clear. Orbital soft tissues unremarkable. Other: None IMPRESSION: No acute intracranial abnormality. Electronically Signed   By: Otelia Limes M.D.   On: 06/17/2019 19:28   MR BRAIN WO CONTRAST  Result Date: 06/17/2019 CLINICAL DATA:  Initial evaluation for acute seizure. EXAM: MRI HEAD WITHOUT CONTRAST TECHNIQUE: Multiplanar, multiecho pulse sequences of the brain and surrounding structures were obtained without intravenous contrast. COMPARISON:  Prior head CT from earlier the same day. FINDINGS: Brain: Cerebral volume within normal limits for age. Multifocal areas of confluent T2/FLAIR hyperintensity seen involving the cortical and  subcortical aspect of the parasagittal frontal lobes as well as the parieto-occipital regions bilaterally. Involvement is fairly symmetric in appearance. Patchy involvement of the left greater than right cerebellar hemispheres present as well. Overall, appearance is most characteristic of PRES. No associated hemorrhage or mass effect. No evidence for acute infarct. Gray-white matter differentiation maintained with no other areas of remote cortical infarction. No foci of susceptibility artifact to suggest acute or chronic intracranial hemorrhage. No mass lesion, mass effect, or midline shift. Ventricles normal size without hydrocephalus. No extra-axial fluid collection. Pituitary gland suprasellar region normal. Midline structures intact. No intrinsic temporal lobe abnormality. Vascular: Major intracranial vascular flow voids are maintained. Skull and upper cervical spine: Craniocervical junction within normal limits. Moderate multilevel cervical spondylosis noted within the visualized upper cervical  spine with associated mild diffuse spinal stenosis. Bone marrow signal intensity within normal limits. No acute scalp soft tissue abnormality. Sinuses/Orbits: Patient status post bilateral ocular lens replacement. Paranasal sinuses are largely clear. No significant mastoid effusion. Inner ear structures grossly normal. Other: None. IMPRESSION: 1. Multifocal confluent T2/FLAIR signal abnormality involving the bilateral cerebral and cerebellar hemispheres as above. Given history of seizure, findings most characteristic for PRES and/or status epilepticus. 2. Otherwise normal brain MRI for age. Electronically Signed   By: Rise Mu M.D.   On: 06/17/2019 23:36      Assessment/Plan Principal Problem:   Seizure (HCC) Active Problems:   Acute pulmonary embolism (HCC)   Type 2 diabetes mellitus (HCC)   HTN (hypertension)   CKD (chronic kidney disease) stage 3, GFR 30-59 ml/min   PRES (posterior reversible  encephalopathy syndrome)     #1 seizure versus PR ES: Patient will be admitted.  Will follow neurology recommendations.  Tight blood pressure control.  Blood pressure has improved with treatment.  As needed hydralazine added.  Continue with antiseizure medications per neurology.  Continuous EEG ordered.  #2 history of saddle pulmonary embolus: Patient on chronic anticoagulation.  We will continue with that.  #3 malignant hypertension: May have contributed to patient's symptoms.  Resume full home regimen and add as needed hydralazine.  #4 chronic kidney disease stage III: Appears to be at baseline.  We will continue to monitor.  #5 diabetes: Blood sugar appears well controlled.  Add sliding scale insulin     DVT prophylaxis: Eliquis Code Status: Full code Family Communication: Son Disposition Plan: Home Consults called: Dr. Otelia Limes, neurology Admission status: Inpatient  Severity of Illness: The appropriate patient status for this patient is INPATIENT. Inpatient status is judged to be reasonable and necessary in order to provide the required intensity of service to ensure the patient's safety. The patient's presenting symptoms, physical exam findings, and initial radiographic and laboratory data in the context of their chronic comorbidities is felt to place them at high risk for further clinical deterioration. Furthermore, it is not anticipated that the patient will be medically stable for discharge from the hospital within 2 midnights of admission. The following factors support the patient status of inpatient.   " The patient's presenting symptoms include seizures. " The worrisome physical exam findings include mild confusion. " The initial radiographic and laboratory data are worrisome because of MRI suggestive of status epilepticus. " The chronic co-morbidities include seizure disorder.   * I certify that at the point of admission it is my clinical judgment that the patient will  require inpatient hospital care spanning beyond 2 midnights from the point of admission due to high intensity of service, high risk for further deterioration and high frequency of surveillance required.Lonia Blood MD [facility] Pager [phone]  If 7PM-7AM, please contact night-coverage [email or web address] Password [facility]  06/17/2019, 11:49 PM

## 2019-06-17 NOTE — Progress Notes (Signed)
EEG complete - results pending 

## 2019-06-17 NOTE — Consult Note (Addendum)
NEURO HOSPITALIST CONSULT NOTE   Requestig physician: Dr. Roslynn Amble  Reason for Consult: AMS and tremors  History obtained from:  Patient, Son and Chart     HPI:                                                                                                                                          Glenda Blankenship is an 79 y.o. female with a PMHx of DM, HTN and pneumonia, who presents with progressive BLE numbness progressing to full-body tremors. Symptom onset was 2 PM today while she was sitting on her couch. Initial symptom was RLE numbness followed by LLE numbness and then bilateral lower extremity tremors that progressed to also involve the arms and face. On EMS arrival the patient was alert and oriented x 4. Tremors progressed at about the time EMS got the patient into the ambulance. She was then administered 5 mg Versed with decrease in the tremors. She opened her eyes but was incontinent of urine. Her BP per EMS was 170/100, O2 sat 100% on 2L [place], temp 99.5 and CBG 98. While in the ED she again was incontinent of urine.   Na, K, BUN, Cr, corrected calcium and transaminases are normal. She has a normal white count. Hgb mildly decreased at 11.6.   CT head shows no acute intracranial abnormality.   Eliquis is listed as one of her home medications in Epic. Her son states that she is compliant with this medication and has not missed any doses. She is taking it for a "blood clot" per her son.   Of note, she has missed her last 2 weeks' worth of her antihypertensive medication due to issues with obtaining a prescription renewal.   Past Medical History:  Diagnosis Date  . Diabetes ([place])   . DVT (deep vein thrombosis) in pregnancy   . Hypertension   . Pneumonia   . Renal disorder     Past Surgical History:  Procedure Laterality Date  . kidney stone removal      No family history on file.           Social History:  reports that she has quit smoking. Her smoking use  included cigarettes. She started smoking about 62 years ago. She smoked 0.50 packs per day. She has never used smokeless tobacco. She reports that she does not drink alcohol or use drugs.  No Known Allergies  HOME MEDICATIONS:  No current facility-administered medications on file prior to encounter.   Current Outpatient Medications on File Prior to Encounter  Medication Sig Dispense Refill  . acetaminophen (TYLENOL) 325 MG tablet Take 2 tablets (650 mg total) by mouth every 6 (six) hours as needed for mild pain, fever or headache.    . docusate sodium (COLACE) 100 MG capsule Take 1 capsule (100 mg total) by mouth 2 (two) times daily. 30 capsule 0  . ELIQUIS 5 MG TABS tablet TAKE 1 TABLET BY MOUTH TWICE DAILY *( NEEDS OFFICE VISIT FOR FUTURE REFILLS )* ( NO MORE REFILLS UNTIL PATIENT HAS AN APPOINTMENT ) * 60 tablet 0  . ferrous sulfate 325 (65 FE) MG tablet Take 1 tablet (325 mg total) by mouth 2 (two) times daily with a meal. 60 tablet 0  . hydrocerin (EUCERIN) CREA Apply 1 application topically daily. Apply to areas of skin rash on face, neck, arm. 113 g 0     ROS:                                                                                                                                       As per HPI. More detailed ROS unobtainable due to confusion.    Blood pressure (!) 196/86, pulse 88, resp. rate 19, height 5\' 2"  (1.575 m), weight 70.3 kg, SpO2 94 %.   General Examination:                                                                                                       Physical Exam  HEENT-  [place]/AT Lungs- Mild SOB Extremities- Warm and well perfused  Neurological Examination Mental Status: While legs are jerking continuously, she is awake and alert but with some disorientation and confusion. Able to correctly state the day of the week but not the month or  the year. Speech is fluent with intact comprehension for basic commands. Cranial Nerves: II: PERRL. Does not fixate on visual stimuli held up in front of her, while claiming that she is looking at them. She can correctly state when a penlight is being shined in front of her, but does not fixate on examiner's face when asked to, stating, "I am looking at your face"  III,IV, VI: No ptosis. Eyes initially deviated to the left. Subsequently deviation switched to the right side. No nystagmus.  V,VII: Face symmetric, facial temp sensation subjectively equal bilaterally VIII: hearing intact to voice IX,X: no hypophonia XI: Head rotated to the left.  After several minutes head becomes rotated to the right.  XII: midline tongue extension Motor: BUE 4+/5 proximally and distally without jerking.  BLE with continuous semi-rhythmic jerking. Unable to volitionally move and BLE do not withdraw to noxious.  Sensory: Temp and FT intact to BUE. Able to perceive cold temp to BLE, but not FT or pressure.  Deep Tendon Reflexes: 1+ bilateral brachioradialis and biceps. 2+ bilateral patellae. Toes mute bilaterally  Cerebellar: Unable to perform FNF. Appears unable to locate position of examiner's hand visually.  Gait: Unable to perform   Lab Results: Basic Metabolic Panel: Recent Labs  Lab 06/17/19 1825 06/17/19 1834  NA 138 139  K 4.2 4.2  CL 105 104  CO2 23  --   GLUCOSE 103* 99  BUN 12 15  CREATININE 0.84 0.80  CALCIUM 8.7*  --     CBC: Recent Labs  Lab 06/17/19 1825 06/17/19 1834  WBC 6.4  --   NEUTROABS 4.3  --   HGB 10.9* 11.6*  HCT 36.3 34.0*  MCV 89.9  --   PLT 248  --     Cardiac Enzymes: No results for input(s): CKTOTAL, CKMB, CKMBINDEX, TROPONINI in the last 168 hours.  Lipid Panel: No results for input(s): CHOL, TRIG, HDL, CHOLHDL, VLDL, LDLCALC in the last 168 hours.  Imaging: No results found.   Assessment: 79 year old female with new onset of tremors, urinary  incontinence and AMS 1. DDx includes new onset seizure. Electrolytes do not reveal an explanation for either her AMS or seizure.  2. CT head shows no acute abnormality. No significant atrophy for her age.  3. Exam shows ongoing asynchronous, semirhythmic jerking of BLE, which she is unable to volitionally move. 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   4. Na, K, BUN, Cr, corrected calcium and transaminases are normal. She has a normal white count. Hgb mildly decreased at 11.6.  5. CT head shows no acute intracranial abnormality.  6. Eliquis is listed as one of her home medications in Epic. Her son states that she is compliant with this medication and has not missed any doses. She is taking it for a "blood clot" per her son.  7. Overall clinical picture is suspicious for PRES with status epilepticus. She has been off of her antihypertensive medication for the past 2 weeks. Presentation is unusual as she maintains consciousness during ongoing motor activity involving her BLE. The disorientation could be due to seizure activity versus hypertensive encephalopathy. 8. STAT EEG at the bedside revealed sharply contoured generalized rhythmic discharges, electrographic DDx being atypical generalized seizure discharges versus severe encephalopathy. However, after 2 mg IV Ativan the discharges gradually decreased in amplitude and were less sharp, progressing to resolution with a low amplitude EEG pattern after about 10 minutes.   Recommendations: 1. STAT MRI brain without contrast (ordered) 2. Will hook up to LTM after MRI brain.  3. Acute BP management for malignant HTN. From a neurological standpoint, her immediate SBP goal should be in the range of 120 - 150. 4. IV Keppra 2000 mg load is currently infusing. Will start scheduled Keppra at 500 mg IV BID (ordered).  5. Vitamin B12  is low by neurological standards at 235. Will need to be started on SQ B12 1000 microgram qd x 7 days, then switched to PO 2000 microgram qd indefinitely.   60 minutes spent  in the emergent neurological evaluation and management of this critically ill patient in status epilepticus.     Electronically signed: Dr. Caryl Pina 06/17/2019, 7:25 PM

## 2019-06-17 NOTE — ED Provider Notes (Signed)
[facility] EMERGENCY DEPARTMENT Provider Note   CSN: [id number] Arrival date & time: 06/17/19  1742     History Chief Complaint  Patient presents with  . Seizures    JERMANI PUND is a 79 y.o. female.  Presents to ER with concern for seizure.  Around 2:00 today, patient reports that she started having bilateral lower leg numbness that was getting progressively worse, then had some weakness as well as leg tremoring.  Tremors moved and progressed to her arms had been to her face.  EMS reported that tremors worsened once they got to the truck and had a generalized tonic-clonic seizure, decreased responsiveness, urinary incontinence.  Provided Versed and tremors decreased.  Patient has had mild dull headache.  Denies vision changes, speech changes.  Of note, has not been taking her blood pressure medicine recently.  Past medical history DVT, on Eliquis.  Diabetes, hypertension.  History obtained via patient, son at bedside.   HPI     Past Medical History:  Diagnosis Date  . Diabetes (HCC)   . DVT (deep vein thrombosis) in pregnancy   . Hypertension   . Pneumonia   . Renal disorder     Patient Active Problem List   Diagnosis Date Noted  . Seizure (HCC) 06/17/2019  . Abnormal CT scan 07/10/2018  . Medication management 07/10/2018  . Healthcare maintenance 03/12/2018  . Dental abscess 02/19/2018  . Skin rash 01/27/2018  . Chronic anemia 01/27/2018  . Type 2 diabetes mellitus (HCC) 01/27/2018  . HTN (hypertension) 01/27/2018  . CKD (chronic kidney disease) stage 3, GFR 30-59 ml/min 01/27/2018  . Acute pulmonary embolism (HCC) 01/26/2018  . Acute saddle pulmonary embolism with acute cor pulmonale (HCC)   . Deep venous thrombosis (HCC)   . Septic shock (HCC)   . Hyperkalemia   . Gastroenteritis   . Pleural effusion, right   . Hypovolemic shock (HCC) 01/08/2016  . Shock (HCC) 01/08/2016  . Acute kidney injury (HCC)   . Metabolic acidosis   . Lactic  acidosis     Past Surgical History:  Procedure Laterality Date  . kidney stone removal       OB History   No obstetric history on file.     No family history on file.  Social History   Tobacco Use  . Smoking status: Former Smoker    Packs/day: 0.50    Types: Cigarettes    Start date: 07/09/1956  . Smokeless tobacco: Never Used  . Tobacco comment: quit "so loing ago" not give me a date  Substance Use Topics  . Alcohol use: No  . Drug use: No    Home Medications Prior to Admission medications   Medication Sig Start Date End Date Taking? Authorizing Provider  acetaminophen (TYLENOL) 325 MG tablet Take 2 tablets (650 mg total) by mouth every 6 (six) hours as needed for mild pain, fever or headache. 01/29/18   Hongalgi, Maximino Greenland, MD  docusate sodium (COLACE) 100 MG capsule Take 1 capsule (100 mg total) by mouth 2 (two) times daily. 01/29/18   Hongalgi, Maximino Greenland, MD  ELIQUIS 5 MG TABS tablet TAKE 1 TABLET BY MOUTH TWICE DAILY *( NEEDS OFFICE VISIT FOR FUTURE REFILLS )* ( NO MORE REFILLS UNTIL PATIENT HAS AN APPOINTMENT ) * 06/03/19   Coralyn Helling, MD  ferrous sulfate 325 (65 FE) MG tablet Take 1 tablet (325 mg total) by mouth 2 (two) times daily with a meal. 01/29/18   Hongalgi, Maximino Greenland, MD  hydrocerin (EUCERIN) CREA Apply 1 application topically daily. Apply to areas of skin rash on face, neck, arm. 01/29/18   Hongalgi, Lenis Dickinson, MD    Allergies    Patient has no known allergies.  Review of Systems   Review of Systems  Constitutional: Negative for chills and fever.  HENT: Negative for ear pain and sore throat.   Eyes: Negative for pain and visual disturbance.  Respiratory: Negative for cough and shortness of breath.   Cardiovascular: Negative for chest pain and palpitations.  Gastrointestinal: Negative for abdominal pain and vomiting.  Genitourinary: Negative for dysuria and hematuria.  Musculoskeletal: Negative for arthralgias and back pain.  Skin: Negative for color change and  rash.  Neurological: Positive for seizures and syncope.  All other systems reviewed and are negative.   Physical Exam Updated Vital Signs BP (!) 144/71   Pulse 90   Temp 98.6 F (37 C) (Oral)   Resp 18   Ht 5\' 2"  (1.575 m)   Wt 70.3 kg   LMP  (LMP Unknown)   SpO2 100%   BMI 28.35 kg/m   Physical Exam Vitals and nursing note reviewed.  Constitutional:      General: She is not in acute distress.    Appearance: She is well-developed.  HENT:     Head: Normocephalic and atraumatic.  Eyes:     Conjunctiva/sclera: Conjunctivae normal.  Cardiovascular:     Rate and Rhythm: Normal rate and regular rhythm.     Heart sounds: No murmur.  Pulmonary:     Effort: Pulmonary effort is normal. No respiratory distress.     Breath sounds: Normal breath sounds.  Abdominal:     Palpations: Abdomen is soft.     Tenderness: There is no abdominal tenderness.  Musculoskeletal:     Cervical back: Neck supple.  Skin:    General: Skin is warm and dry.  Neurological:     Mental Status: She is alert.     Comments: Patient is alert, oriented x3, mildly confused but answers most questions appropriately CN II-XII intact 5 out of 5 strength in bilateral upper extremities, 0 out of 5 strength in bilateral lower extremities Sensation to light touch intact in all 4 extremities  Brisk patellar reflexes b/l, nonsustained clonus b/l  Rhythmic jerking movement of right lower extremity     ED Results / Procedures / Treatments   Labs (all labs ordered are listed, but only abnormal results are displayed) Labs Reviewed  CBC WITH DIFFERENTIAL/PLATELET - Abnormal; Notable for the following components:      Result Value   Hemoglobin 10.9 (*)    All other components within normal limits  COMPREHENSIVE METABOLIC PANEL - Abnormal; Notable for the following components:   Glucose, Bld 103 (*)    Calcium 8.7 (*)    Albumin 3.4 (*)    Total Bilirubin 0.2 (*)    All other components within normal limits    I-STAT CHEM 8, ED - Abnormal; Notable for the following components:   Calcium, Ion 1.10 (*)    Hemoglobin 11.6 (*)    HCT 34.0 (*)    All other components within normal limits  SARS CORONAVIRUS 2 (TAT 6-24 HRS)  VITAMIN B12  C-REACTIVE PROTEIN  FOLATE  VITAMIN B1    EKG EKG Interpretation  Date/Time:  Monday June 17 2019 18:14:11 EDT Ventricular Rate:  91 PR Interval:    QRS Duration: 80 QT Interval:  379 QTC Calculation: 467 R Axis:   49 Text  Interpretation: Sinus rhythm Probable left atrial enlargement Probable left ventricular hypertrophy Confirmed by Marianna Fuss ([id number]) on 06/17/2019 7:09:35 PM   Radiology EEG  Result Date: 06/17/2019 Charlsie Quest, MD     06/17/2019  9:56 PM Patient Name: TAJANA CROTTEAU MRN: [id number] Epilepsy Attending: Charlsie Quest Referring Physician/Provider: Dr Sindy Messing Date: 06/17/2019 Duration: 37.49 mins Patient history: 79yo F presented with ams and seizure. EEG to evaluate for status epilepticus. Level of alertness: lethargic AEDs during EEG study: Ativan, Keppra Technical aspects: This EEG study was done with scalp electrodes positioned according to the 10-20 International system of electrode placement. Electrical activity was acquired at a sampling rate of 500Hz  and reviewed with a high frequency filter of 70Hz  and a low frequency filter of 1Hz . EEG data were recorded continuously and digitally stored. DESCRIPTION: At the beginning of the study, eeg showed continuous generalized and maximal left posterior quadrant 3-5hz  rhythmic theta -delta slowing with overriding 13-15hz  beta activity. Sharp waves were also seen in bilateral, left>right posterior quadrant at 2-2.5hz . Clinically patient was noted to have bilateral leg jerking as well as gaze deviation to left and then right. IV Ativan 2mg  was administered at 2119. After IV ativan , the rhythmic slowing slowly resolved by around 2134 and was replaced by low amplitude generalized 2-3 Hz  delta slowing admixed with 13-15hz  beta activity. Hyperventilation and photic stimulation were not performed. ABNORMALITY - Multifocal convulsive status epilepticus, bilateral (Left>Right) posterior quadrant - Continuous slow, generalized - Beta activity, generalized IMPRESSION: This study initially showed evidence of convulsive status epilepticus arising from bilateral posterior quadrant, left>right. After IV ativan, seizure stopped at around 2134 and EEG was suggestive of severe diffuse encephalopathy, non specific to etiology but likely secondary to sedation. Dr was notified. 2120   CT Head Wo Contrast  Result Date: 06/17/2019 CLINICAL DATA:  New onset seizure EXAM: CT HEAD WITHOUT CONTRAST TECHNIQUE: Contiguous axial images were obtained from the base of the skull through the vertex without intravenous contrast. COMPARISON:  02/19/2018 FINDINGS: Brain: No acute intracranial abnormality. Specifically, no hemorrhage, hydrocephalus, mass lesion, acute infarction, or significant intracranial injury. Vascular: No hyperdense vessel or unexpected calcification. Skull: No acute calvarial abnormality. Sinuses/Orbits: Visualized paranasal sinuses and mastoids clear. Orbital soft tissues unremarkable. Other: None IMPRESSION: No acute intracranial abnormality. Electronically Signed   By: Otelia Limes M.D.   On: 06/17/2019 19:28    Procedures .Critical Care Performed by: 06/19/2019, MD Authorized by: 02/21/2018, MD   Critical care provider statement:    Critical care time (minutes):  72   Critical care was necessary to treat or prevent imminent or life-threatening deterioration of the following conditions:  CNS failure or compromise   Critical care was time spent personally by me on the following activities:  Discussions with consultants, evaluation of patient's response to treatment, examination of patient, ordering and performing treatments and interventions, ordering and  review of laboratory studies, ordering and review of radiographic studies, pulse oximetry, re-evaluation of patient's condition, obtaining history from patient or surrogate and review of old charts   (including critical care time)  Medications Ordered in ED Medications  LORazepam (ATIVAN) injection 2 mg (has no administration in time range)  LORazepam (ATIVAN) injection 2 mg (has no administration in time range)  levETIRAcetam (KEPPRA) IVPB 500 mg/100 mL premix (has no administration in time range)  LORazepam (ATIVAN) injection 1 mg (1 mg Intravenous Given 06/17/19 1823)  acetaminophen (TYLENOL) tablet 500 mg (500 mg  Oral Given 06/17/19 2020)  levETIRAcetam (KEPPRA) 2,000 mg in sodium chloride 0.9 % 250 mL IVPB (0 mg Intravenous Stopped 06/17/19 2154)  LORazepam (ATIVAN) injection 2 mg (2 mg Intravenous Given 06/17/19 2125)  labetalol (NORMODYNE) injection 10 mg (10 mg Intravenous Given 06/17/19 2125)  LORazepam (ATIVAN) 2 MG/ML injection (2 mg  Given 06/17/19 2136)    ED Course  I have reviewed the triage vital signs and the nursing notes.  Pertinent labs & imaging results that were available during my care of the patient were reviewed by me and considered in my medical decision making (see chart for details).  Clinical Course as of Jun 17 2247  Mon Jun 17, 2019  1805 At bedside soon after arrival, seizure with ems stopped but now having tremor vs partial seizure in RLE and RUE, will give additional ativan, will discuss with neurology   [RD]  1813 D/w [place], someone from their team will come evaluate and give further recommendations, agrees with holding off on calling code stroke   [RD]  1909 Carla chaperone rectal - mildly decreased rectal tone; no ongoing tremors   [RD]  2042 EEG tech at bedside setting up   [RD]  2047 Lindzen here to see   [RD]  2123 Lindzen rec keppra, ativan load after finishing a preliminary EEG; pt however went into generalized tonic-clonic seizure - 3 mins  total, placed on NRB, stopped before getting the ativan, proceeded with keppra and ativan   [RD]    Clinical Course User Index [RD] Milagros Loll, MD   MDM Rules/Calculators/A&P                      79 year old lady with history of hypertension, DVT/PE on Eliquis presenting to ER with concern for new onset seizures.  On arrival here, her generalized seizure had resolved but she had some ongoing right leg tremor versus focal seizure.  Improved with Ativan.  After completing my initial assessment, consulted neurology given current clinical picture. Neuro came to bedside. Obtained stat EEG.  On EEG, patient had another generalized tonic-clonic seizure.  Resolved after total about 3 minutes.  Per neuro recs, loaded with Ativan and Keppra.  Neurology concern for posterior reversible encephalopathy syndrome.  Stat MRI per neuro.  BP controlled with a single dose of labetalol.  Admitted to hospitalist service for further management, neurology to continue following closely.  Son at bedside and updated frequently throughout stay.   Final Clinical Impression(s) / ED Diagnoses Final diagnoses:  Status epilepticus (HCC)  Posterior reversible encephalopathy syndrome    Rx / DC Orders ED Discharge Orders    None       Milagros Loll, MD 06/17/19 2250

## 2019-06-17 NOTE — ED Triage Notes (Signed)
To room via EMS.  Onset 2pm today pt was sitting on cough and right leg started getting numb, then left leg started getting numb.  Legs started to get tremors, then tremors to arms, then to face.  EMS got to home and pt was A&Ox4.  Denied headache, dizziness.  When EMS got pt in truck tremors progressed, pt was given Versed 5mg , tremors decreased, pt would open eyes, incontinent of urine.   Pt hasn't been taking BP medication, Lisinopril.  Pharmacy has faxed to office several times.   EMS BP 170/100 SpO2 100% on oxygen 2L via [place]  CBG 98  Temp 99.5

## 2019-06-18 ENCOUNTER — Encounter (HOSPITAL_COMMUNITY): Payer: Medicare Other

## 2019-06-18 ENCOUNTER — Other Ambulatory Visit (HOSPITAL_COMMUNITY): Payer: Medicare Other

## 2019-06-18 DIAGNOSIS — I10 Essential (primary) hypertension: Secondary | ICD-10-CM

## 2019-06-18 DIAGNOSIS — I6783 Posterior reversible encephalopathy syndrome: Secondary | ICD-10-CM

## 2019-06-18 DIAGNOSIS — E1165 Type 2 diabetes mellitus with hyperglycemia: Secondary | ICD-10-CM

## 2019-06-18 DIAGNOSIS — R569 Unspecified convulsions: Secondary | ICD-10-CM

## 2019-06-18 LAB — COMPREHENSIVE METABOLIC PANEL
ALT: 18 U/L (ref 0–44)
AST: 24 U/L (ref 15–41)
Albumin: 3.1 g/dL — ABNORMAL LOW (ref 3.5–5.0)
Alkaline Phosphatase: 56 U/L (ref 38–126)
Anion gap: 7 (ref 5–15)
BUN: 10 mg/dL (ref 8–23)
CO2: 26 mmol/L (ref 22–32)
Calcium: 8.9 mg/dL (ref 8.9–10.3)
Chloride: 108 mmol/L (ref 98–111)
Creatinine, Ser: 0.97 mg/dL (ref 0.44–1.00)
GFR calc Af Amer: >60 mL/min (ref >60)
GFR calc non Af Amer: 56 mL/min — ABNORMAL LOW (ref >60)
Glucose, Bld: 89 mg/dL (ref 70–99)
Potassium: 3.7 mmol/L (ref 3.5–5.1)
Sodium: 141 mmol/L (ref 135–145)
Total Bilirubin: 0.4 mg/dL (ref 0.3–1.2)
Total Protein: 6.7 g/dL (ref 6.5–8.1)

## 2019-06-18 LAB — CBC
HCT: 34.7 % — ABNORMAL LOW (ref 36.0–46.0)
Hemoglobin: 10.4 g/dL — ABNORMAL LOW (ref 12.0–15.0)
MCH: 26.7 pg (ref 26.0–34.0)
MCHC: 30 g/dL (ref 30.0–36.0)
MCV: 89.2 fL (ref 80.0–100.0)
Platelets: 253 10*3/uL (ref 150–400)
RBC: 3.89 MIL/uL (ref 3.87–5.11)
RDW: 14.5 % (ref 11.5–15.5)
WBC: 5.2 10*3/uL (ref 4.0–10.5)
nRBC: 0 % (ref 0.0–0.2)

## 2019-06-18 LAB — GLUCOSE, CAPILLARY: Glucose-Capillary: 107 mg/dL — ABNORMAL HIGH (ref 70–99)

## 2019-06-18 LAB — TSH: TSH: 1.555 u[IU]/mL (ref 0.350–4.500)

## 2019-06-18 LAB — SARS CORONAVIRUS 2 (TAT 6-24 HRS): SARS Coronavirus 2: NEGATIVE

## 2019-06-18 MED ORDER — ACETAMINOPHEN 325 MG PO TABS
650.0000 mg | ORAL_TABLET | Freq: Four times a day (QID) | ORAL | Status: DC | PRN
  Administered 2019-06-18 – 2019-06-19 (×2): 650 mg via ORAL
  Filled 2019-06-18 (×4): qty 2

## 2019-06-18 MED ORDER — ONDANSETRON HCL 4 MG/2ML IJ SOLN: 4.0000 mg | Freq: Four times a day (QID) | INTRAMUSCULAR | Status: DC | PRN

## 2019-06-18 MED ORDER — HYDRALAZINE HCL 20 MG/ML IJ SOLN: 10.0000 mg | Freq: Four times a day (QID) | INTRAMUSCULAR | Status: DC | PRN

## 2019-06-18 MED ORDER — LISINOPRIL-HYDROCHLOROTHIAZIDE 20-25 MG PO TABS: 1.0000 | ORAL_TABLET | Freq: Every day | ORAL | Status: DC

## 2019-06-18 MED ORDER — SODIUM CHLORIDE 0.45 % IV SOLN: INTRAVENOUS | Status: DC

## 2019-06-18 MED ORDER — APIXABAN 2.5 MG PO TABS
2.5000 mg | ORAL_TABLET | Freq: Two times a day (BID) | ORAL | Status: DC
  Administered 2019-06-18 – 2019-06-19 (×3): 2.5 mg via ORAL
  Filled 2019-06-18 (×4): qty 1

## 2019-06-18 MED ORDER — INSULIN ASPART 100 UNIT/ML ~~LOC~~ SOLN: 0.0000 [IU] | Freq: Every day | SUBCUTANEOUS | Status: DC

## 2019-06-18 MED ORDER — ONDANSETRON HCL 4 MG PO TABS: 4.0000 mg | ORAL_TABLET | Freq: Four times a day (QID) | ORAL | Status: DC | PRN

## 2019-06-18 MED ORDER — INSULIN ASPART 100 UNIT/ML ~~LOC~~ SOLN
0.0000 [IU] | Freq: Three times a day (TID) | SUBCUTANEOUS | Status: DC
  Administered 2019-06-19: 17:00:00 1 [IU] via SUBCUTANEOUS

## 2019-06-18 MED ORDER — HYDROCHLOROTHIAZIDE 25 MG PO TABS
25.0000 mg | ORAL_TABLET | Freq: Every day | ORAL | Status: DC
  Administered 2019-06-18 – 2019-06-20 (×3): 25 mg via ORAL
  Filled 2019-06-18 (×3): qty 1

## 2019-06-18 MED ORDER — LABETALOL HCL 5 MG/ML IV SOLN
5.0000 mg | INTRAVENOUS | Status: DC | PRN
  Administered 2019-06-18 – 2019-06-19 (×6): 5 mg via INTRAVENOUS
  Filled 2019-06-18 (×6): qty 4

## 2019-06-18 MED ORDER — ACETAMINOPHEN 650 MG RE SUPP: 650.0000 mg | Freq: Four times a day (QID) | RECTAL | Status: DC | PRN

## 2019-06-18 MED ORDER — LISINOPRIL 20 MG PO TABS
20.0000 mg | ORAL_TABLET | Freq: Every day | ORAL | Status: DC
  Administered 2019-06-18 – 2019-06-20 (×3): 20 mg via ORAL
  Filled 2019-06-18 (×3): qty 1

## 2019-06-18 NOTE — Progress Notes (Signed)
NIF -40 

## 2019-06-18 NOTE — Progress Notes (Signed)
PROGRESS NOTE  Glenda Blankenship  VKP:[id number] DOB: Nov 19, 1940 DOA: 06/17/2019 PCP: Carmel Sacramento, NP   Brief Narrative: Glenda Blankenship is a 79 y.o. 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 EMS was contacted and the patient continued to have tremors in the ED. BP 211/91 - 170/100. Antihypertensives, keppra were given as EEG confirms convulsive status epilepticus. MRI revealed changes consistent with PRES.   Assessment & Plan: Principal Problem:   Seizure (HCC) Active Problems:   Acute pulmonary embolism (HCC)   Type 2 diabetes mellitus (HCC)   HTN (hypertension)   CKD (chronic kidney disease) stage 3, GFR 30-59 ml/min   PRES (posterior reversible encephalopathy syndrome)  PRES:  - SBP goal <149mmHg. 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 Will continue keppra 500mg  BID with plans IV > PO once tolerating po. - Seizure precautions, driving restrictions after DC. - Ativan 2mg  IV prn seizure more than 2 minutes. - Needs neurology follow up in 8-12 weeks with repeat MRI. If BP is well-controlled and imaging shows resolution, may be able to DC keppra.    Acute encephalopathy: Multifactorial due to postictal state, PRES, and sedating medications for seizure. CRP is negative B12 and folic acid were within normal.  Head CT showed no acute abnormalities, no stroke on CT or MRI. - AEDs as above   Weakness:  - Remains despite improvement in mentation. Will get PT/OT/SLP  HTN:  - Manage BP with home lisinopril/HCTZ, goal as above is SBP <146mmHg.   Stage IIIa CKD: SCr at baseline.  - Can DC IVF once taking po  History of saddle PE:  - Continue eliquis  History of T2DM:  No significant hyperglycemia since arrival, last HbA1c in 2019 was only 6.2%.  - Recheck HbA1c.   DVT prophylaxis: Eliquis Code Status: Full Family Communication: None at bedside Disposition Plan:  Status is: Inpatient  Remains inpatient appropriate because:Altered mental status, IV treatments appropriate due to intensity of illness or inability to take PO and Inpatient level of care appropriate due to severity of illness   Dispo: The patient is from: Home              Anticipated d/c is to: TBD, PT/OT consulted. Weakness currently would preclude return home (lives alone)              Anticipated d/c date is: 2 days              Patient currently is not medically stable to d/c.  Consultants:   Neurology  Procedures:   LTM EEG  Antimicrobials:  None   Subjective: Feels tired, but is oriented and recalls events leading to hospitalization. Says she still can't move her legs or arms very well. No numbness. She's hungry.  Objective: Vitals:   06/18/19 1045 06/18/19 1100 06/18/19 1115 06/18/19 1130  BP: (!) 144/67 (!) 146/71 (!) 154/69 (!) 154/68  Pulse: 92 89 89 89  Resp: 19 19 19 19   Temp:      TempSrc:      SpO2: 95% 94% 95% 93%  Weight:      Height:        Intake/Output Summary (Last 24 hours) at 06/18/2019 1205 Last data filed at 06/18/2019 1152 Gross  per 24 hour  Intake 100 ml  Output --  Net 100 ml   Filed Weights   06/17/19 1843  Weight: 70.3 kg    Gen: Tired-appearing female in no distress  Pulm: Non-labored breathing supplemental oxygen. Clear to auscultation bilaterally.  CV: Regular rate and rhythm. No murmur, rub, or gallop. No JVD, no pedal edema. GI: Abdomen soft, non-tender, non-distended, with normoactive bowel sounds. No organomegaly or masses felt. Ext: Warm, no deformities Skin: No rashes, lesions or ulcers Neuro: Alert and oriented. Can barely lift legs off bed, barely lifts arms at the elbow more than 6 inches, but is able to touch her nose  when encouraged. No nystagmus, PERRL, arcus senilis noted.  Psych: Judgement and insight appear fair. Mood & affect appropriate.   Data Reviewed: I have personally reviewed following labs and imaging studies  CBC: Recent Labs  Lab 06/17/19 1825 06/17/19 1834  WBC 6.4  --   NEUTROABS 4.3  --   HGB 10.9* 11.6*  HCT 36.3 34.0*  MCV 89.9  --   PLT 248  --    Basic Metabolic Panel: Recent Labs  Lab 06/17/19 1825 06/17/19 1834  NA 138 139  K 4.2 4.2  CL 105 104  CO2 23  --   GLUCOSE 103* 99  BUN 12 15  CREATININE 0.84 0.80  CALCIUM 8.7*  --    GFR: Estimated Creatinine Clearance: 52.4 mL/min (by C-G formula based on SCr of 0.8 mg/dL). Liver Function Tests: Recent Labs  Lab 06/17/19 1825  AST 31  ALT 18  ALKPHOS 55  BILITOT 0.2*  PROT 7.0  ALBUMIN 3.4*   No results for input(s): LIPASE, AMYLASE in the last 168 hours. No results for input(s): AMMONIA in the last 168 hours. Coagulation Profile: No results for input(s): INR, PROTIME in the last 168 hours. Cardiac Enzymes: No results for input(s): CKTOTAL, CKMB, CKMBINDEX, TROPONINI in the last 168 hours. BNP (last 3 results) No results for input(s): PROBNP in the last 8760 hours. HbA1C: No results for input(s): HGBA1C in the last 72 hours. CBG: No results for input(s): GLUCAP in the last 168 hours. Lipid Profile: No results for input(s): CHOL, HDL, LDLCALC, TRIG, CHOLHDL, LDLDIRECT in the last 72 hours. Thyroid Function Tests: No results for input(s): TSH, T4TOTAL, FREET4, T3FREE, THYROIDAB in the last 72 hours. Anemia Panel: Recent Labs    06/17/19 1858  VITAMINB12 235  FOLATE 14.2   Urine analysis: No results found for: COLORURINE, APPEARANCEUR, LABSPEC, PHURINE, GLUCOSEU, HGBUR, BILIRUBINUR, KETONESUR, PROTEINUR, UROBILINOGEN, NITRITE, LEUKOCYTESUR Recent Results (from the past 240 hour(s))  SARS CORONAVIRUS 2 (TAT 6-24 HRS) Nasopharyngeal Nasopharyngeal Swab     Status: None   Collection Time: 06/18/19  12:56 AM   Specimen: Nasopharyngeal Swab  Result Value Ref Range Status   SARS Coronavirus 2 NEGATIVE NEGATIVE Final    Comment: (NOTE) SARS-CoV-2 target nucleic acids are NOT DETECTED. The SARS-CoV-2 RNA is generally detectable in upper and lower respiratory specimens during the acute phase of infection. Negative results do not preclude SARS-CoV-2 infection, do not rule out co-infections with other pathogens, and should not be used as the sole basis for treatment or other patient management decisions. Negative results must be combined with clinical observations, patient history, and epidemiological information. The expected result is Negative. Fact Sheet for Patients: HairSlick.no Fact Sheet for Healthcare Providers: quierodirigir.com This test is not yet approved or cleared by the [place] FDA and  has been authorized for detection and/or diagnosis of SARS-CoV-2  by FDA under an Emergency Use Authorization (EUA). This EUA will remain  in effect (meaning this test can be used) for the duration of the COVID-19 declaration under Section 56 4(b)(1) of the Act, 21 U.S.C. section 360bbb-3(b)(1), unless the authorization is terminated or revoked sooner. Performed at [facility], [place] [address] [place], [place] [address]       Radiology Studies: EEG  Result Date: 06/17/2019 Lora Havens, MD     06/18/2019  9:31 AM Patient Name: KASSIDY DOCKENDORF MRN: [id number] Epilepsy Attending: Lora Havens Referring Physician/Provider: Dr Kerney Elbe Date: 06/17/2019 Duration: 37.49 mins Patient history: 79yo F presented with ams and seizure. EEG to evaluate for status epilepticus. Level of alertness: lethargic AEDs during EEG study: Ativan, Keppra Technical aspects: This EEG study was done with scalp electrodes positioned according to the 10-20 International system of electrode placement. Electrical activity was acquired at a  sampling rate of 500Hz  and reviewed with a high frequency filter of 70Hz  and a low frequency filter of 1Hz . EEG data were recorded continuously and digitally stored. DESCRIPTION: At the beginning of the study, eeg showed continuous generalized and maximal left posterior quadrant 3-5hz  rhythmic theta -delta slowing with overriding 13-15hz  beta activity. Sharp waves were also seen in bilateral, left>right posterior quadrant at 2-2.5hz . Clinically patient was noted to have bilateral leg jerking as well as gaze deviation to left and then right. IV Ativan 2mg  was administered at 2119. After IV ativan , the rhythmic slowing slowly resolved by around 2134 and was replaced by low amplitude generalized 2-3 Hz delta slowing admixed with 13-15hz  beta activity. Hyperventilation and photic stimulation were not performed. ABNORMALITY - Multifocal convulsive status epilepticus, bilateral (Left>Right) posterior quadrant - Continuous slow, generalized - Beta activity, generalized IMPRESSION: This study initially showed evidence of convulsive status epilepticus arising from bilateral posterior quadrant, left>right. After IV ativan, seizure stopped at around 2134 and EEG was suggestive of severe diffuse encephalopathy, non specific to etiology but likely secondary to sedation. Dr Cheral Marker was notified. Lora Havens   CT Head Wo Contrast  Result Date: 06/17/2019 CLINICAL DATA:  New onset seizure EXAM: CT HEAD WITHOUT CONTRAST TECHNIQUE: Contiguous axial images were obtained from the base of the skull through the vertex without intravenous contrast. COMPARISON:  02/19/2018 FINDINGS: Brain: No acute intracranial abnormality. Specifically, no hemorrhage, hydrocephalus, mass lesion, acute infarction, or significant intracranial injury. Vascular: No hyperdense vessel or unexpected calcification. Skull: No acute calvarial abnormality. Sinuses/Orbits: Visualized paranasal sinuses and mastoids clear. Orbital soft tissues unremarkable.  Other: None IMPRESSION: No acute intracranial abnormality. Electronically Signed   By: Rolm Baptise M.D.   On: 06/17/2019 19:28   MR BRAIN WO CONTRAST  Result Date: 06/17/2019 CLINICAL DATA:  Initial evaluation for acute seizure. EXAM: MRI HEAD WITHOUT CONTRAST TECHNIQUE: Multiplanar, multiecho pulse sequences of the brain and surrounding structures were obtained without intravenous contrast. COMPARISON:  Prior head CT from earlier the same day. FINDINGS: Brain: Cerebral volume within normal limits for age. Multifocal areas of confluent T2/FLAIR hyperintensity seen involving the cortical and subcortical aspect of the parasagittal frontal lobes as well as the parieto-occipital regions bilaterally. Involvement is fairly symmetric in appearance. Patchy involvement of the left greater than right cerebellar hemispheres present as well. Overall, appearance is most characteristic of PRES. No associated hemorrhage or mass effect. No evidence for acute infarct. Gray-white matter differentiation maintained with no other areas of remote cortical infarction. No foci of susceptibility artifact to suggest acute or chronic intracranial  hemorrhage. No mass lesion, mass effect, or midline shift. Ventricles normal size without hydrocephalus. No extra-axial fluid collection. Pituitary gland suprasellar region normal. Midline structures intact. No intrinsic temporal lobe abnormality. Vascular: Major intracranial vascular flow voids are maintained. Skull and upper cervical spine: Craniocervical junction within normal limits. Moderate multilevel cervical spondylosis noted within the visualized upper cervical spine with associated mild diffuse spinal stenosis. Bone marrow signal intensity within normal limits. No acute scalp soft tissue abnormality. Sinuses/Orbits: Patient status post bilateral ocular lens replacement. Paranasal sinuses are largely clear. No significant mastoid effusion. Inner ear structures grossly normal. Other:  None. IMPRESSION: 1. Multifocal confluent T2/FLAIR signal abnormality involving the bilateral cerebral and cerebellar hemispheres as above. Given history of seizure, findings most characteristic for PRES and/or status epilepticus. 2. Otherwise normal brain MRI for age. Electronically Signed   By: Rise Mu M.D.   On: 06/17/2019 23:36    Scheduled Meds: . apixaban  2.5 mg Oral BID  . LORazepam  2 mg Intravenous Once   Continuous Infusions: . sodium chloride 75 mL/hr at 06/18/19 1157  . levETIRAcetam Stopped (06/18/19 1152)     LOS: 1 day   Time spent: 25 minutes.  Tyrone Nine, MD [facility] [email or web address] 06/18/2019, 12:05 PM

## 2019-06-18 NOTE — ED Notes (Signed)
EEG is up and runing

## 2019-06-18 NOTE — ED Notes (Addendum)
Seizure pads placed on Pt.'s bed

## 2019-06-18 NOTE — Procedures (Signed)
Patient Name: LAKYSHA KOSSMAN  MRN: [id number]  Epilepsy Attending: Charlsie Quest  Referring Physician/Provider: Dr Caryl Pina Duration: 06/17/2019 2315 to 06/18/2019 1135  Patient history: 79yo F presented with ams and seizure. EEG to evaluate for status epilepticus.   Level of alertness: lethargic  AEDs during EEG study: Ativan, Keppra  Technical aspects: This EEG study was done with scalp electrodes positioned according to the 10-20 International system of electrode placement. Electrical activity was acquired at a sampling rate of 500Hz  and reviewed with a high frequency filter of 70Hz  and a low frequency filter of 1Hz . EEG data were recorded continuously and digitally stored.   DESCRIPTION: EEG showed continuous low amplitude generalized 2-3 Hz delta slowing admixed with 13-15hz  beta activity. Hyperventilation and photic stimulation were not performed.  ABNORMALITY - Continuous slow, generalized - Beta activity, generalized   IMPRESSION: This study is suggestive of severe diffuse encephalopathy, non specific to etiology but likely secondary to sedation. No seizures or definite epileptiform discharges were seen.  Asjia Berrios 

## 2019-06-18 NOTE — ED Notes (Signed)
Pt sleeping. symmetrical rise and fall of chest observed. Vitals WDL

## 2019-06-18 NOTE — Progress Notes (Signed)
vLTM started  Neurology notified 

## 2019-06-18 NOTE — ED Notes (Signed)
Lunch Tray Ordered @ 1032. 

## 2019-06-18 NOTE — Progress Notes (Signed)
Subjective: No further seizure-like episodes overnight.  This morning patient is awake and following all commands.  States her doctor stopped her blood pressure medications and therefore has not been taking them recently.  Denies any headache.  Reports weakness and numbness in bilateral lower extremities.  ROS: negative except above  Examination  Vital signs in last 24 hours: Temp:  [98.6 F (37 C)] 98.6 F (37 C) (04/19 2154) Pulse Rate:  [79-104] 89 (04/20 1130) Resp:  [13-27] 19 (04/20 1130) BP: (112-211)/(56-115) 154/68 (04/20 1130) SpO2:  [91 %-100 %] 93 % (04/20 1130) Weight:  [70.3 kg] 70.3 kg (04/19 1843)  General: lying in bed, not in apparent distress CVS: pulse-normal rate and rhythm RS: breathing comfortably, CTA B Extremities: normal, warm  Neuro: MS: Alert, oriented, follows commands CN: pupils equal and reactive,  EOMI, face symmetric, tongue midline, normal sensation over face, Motor: 45 strength in bilateral upper extremities, 3/5 in bilateral lower extremities   Basic Metabolic Panel: Recent Labs  Lab 06/17/19 1825 06/17/19 1834  NA 138 139  K 4.2 4.2  CL 105 104  CO2 23  --   GLUCOSE 103* 99  BUN 12 15  CREATININE 0.84 0.80  CALCIUM 8.7*  --     CBC: Recent Labs  Lab 06/17/19 1825 06/17/19 1834  WBC 6.4  --   NEUTROABS 4.3  --   HGB 10.9* 11.6*  HCT 36.3 34.0*  MCV 89.9  --   PLT 248  --      Coagulation Studies: No results for input(s): LABPROT, INR in the last 72 hours.  Imaging MRI brain without contrast 06/17/2019: Multifocal confluent T2/FLAIR signal abnormality involving the bilateral cerebral and cerebellar hemispheres as above. Given  history of seizure, findings most characteristic for PRES and/or status epilepticus.    ASSESSMENT AND PLAN: 79 year old female presented with new onset seizure-like episodes and blood pressure of 196/86.  Stat EEG showed convulsive's status epilepticus which resolved after IV Ativan and Keppra.   MRI brain showed PRES.   Posterior reversible encephalopathy syndrome Convulsive status epilepticus (resolved) New onset seizures Acute ictal/postictal encephalopathy (improving) Hypocalcemia Hypoalbuminemia Anemia -EEG overnight did not show any further seizures  Recommendations - Discontinue LTM EEG as patient has not had any further seizures overnight and mental status is already starting to improve. - Continue Keppra 500 mg twice daily, this can be switched to p.o. after patient is able to tolerate p.o. diet - Goal SBP less than 140 for management of PRES, as needed IV labetalol ordered - Continue seizure precautions including do not drive after discharge -  PRN IV Ativan for clinical seizure-like activity lasting more than 2 minutes - Will require follow-up with neurology in 8 to 12 weeks after discharge - Patient will likely not need Keppra long-term if repeat MRI in few months shows complete resolution and patient's blood pressure is well managed  Seizure precautions: Per [place] statutes, patients with seizures are not allowed to drive until they have been seizure-free for six months and cleared by a physician    Use caution when using heavy equipment or power tools. Avoid working on ladders or at heights. Take showers instead of baths. Ensure the water temperature is not too high on the home water heater. Do not go swimming alone. Do not lock yourself in a room alone (i.e. bathroom). When caring for infants or small children, sit down when holding, feeding, or changing them to minimize risk of injury to the child in the  event you have a seizure. Maintain good sleep hygiene. Avoid alcohol.    If patient has another seizure, call 911 and bring them back to the ED if: A.  The seizure lasts longer than 5 minutes.      B.  The patient doesn't wake shortly after the seizure or has new problems such as difficulty seeing, speaking or moving following the seizure C.  The  patient was injured during the seizure D.  The patient has a temperature over 102 F (39C) E.  The patient vomited during the seizure and now is having trouble breathing    During the Seizure   - First, ensure adequate ventilation and place patients on the floor on their left side  Loosen clothing around the neck and ensure the airway is patent. If the patient is clenching the teeth, do not force the mouth open with any object as this can cause severe damage - Remove all items from the surrounding that can be hazardous. The patient may be oblivious to what's happening and may not even know what he or she is doing. If the patient is confused and wandering, either gently guide him/her away and block access to outside areas - Reassure the individual and be comforting - Call 911. In most cases, the seizure ends before EMS arrives. However, there are cases when seizures may last over 3 to 5 minutes. Or the individual may have developed breathing difficulties or severe injuries. If a pregnant patient or a person with diabetes develops a seizure, it is prudent to call an ambulance. - Finally, if the patient does not regain full consciousness, then call EMS. Most patients will remain confused for about 45 to 90 minutes after a seizure, so you must use judgment in calling for help. - Avoid restraints but make sure the patient is in a bed with padded side rails - Place the individual in a lateral position with the neck slightly flexed; this will help the saliva drain from the mouth and prevent the tongue from falling backward - Remove all nearby furniture and other hazards from the area - Provide verbal assurance as the individual is regaining consciousness - Provide the patient with privacy if possible - Call for help and start treatment as ordered by the caregiver    After the Seizure (Postictal Stage)   After a seizure, most patients experience confusion, fatigue, muscle pain and/or a headache. Thus,  one should permit the individual to sleep. For the next few days, reassurance is essential. Being calm and helping reorient the person is also of importance.   Most seizures are painless and end spontaneously. Seizures are not harmful to others but can lead to complications such as stress on the lungs, brain and the heart. Individuals with prior lung problems may develop labored breathing and respiratory distress.    I have spent a total of 35  minutes with the patient reviewing hospital notes,  test results, labs and examining the patient as well as establishing an assessment and plan that was discussed personally with the patient and physician.  > 50% of time was spent in direct patient care.    Lindie Spruce Epilepsy [facility] For questions after 5pm please refer to AMION to reach the Neurologist on call

## 2019-06-18 NOTE — ED Notes (Signed)
Spoke to previous Rn and she stated that ativan had been given at approx 2135 hours on an emergency override per MD order. She advised that MD told her to cancel the 2 mg that was in the mar. Since the emergency amount had already been given

## 2019-06-18 NOTE — ED Notes (Signed)
Glenda Blankenship, the son [phone] Wishes to contated on room assignment

## 2019-06-19 ENCOUNTER — Encounter (HOSPITAL_COMMUNITY): Payer: Self-pay | Admitting: Internal Medicine

## 2019-06-19 LAB — BASIC METABOLIC PANEL
Anion gap: 8 (ref 5–15)
BUN: 12 mg/dL (ref 8–23)
CO2: 25 mmol/L (ref 22–32)
Calcium: 9 mg/dL (ref 8.9–10.3)
Chloride: 106 mmol/L (ref 98–111)
Creatinine, Ser: 0.96 mg/dL (ref 0.44–1.00)
GFR calc Af Amer: >60 mL/min (ref >60)
GFR calc non Af Amer: 56 mL/min — ABNORMAL LOW (ref >60)
Glucose, Bld: 98 mg/dL (ref 70–99)
Potassium: 3.5 mmol/L (ref 3.5–5.1)
Sodium: 139 mmol/L (ref 135–145)

## 2019-06-19 LAB — CBC
HCT: 36.1 % (ref 36.0–46.0)
Hemoglobin: 10.8 g/dL — ABNORMAL LOW (ref 12.0–15.0)
MCH: 26.5 pg (ref 26.0–34.0)
MCHC: 29.9 g/dL — ABNORMAL LOW (ref 30.0–36.0)
MCV: 88.5 fL (ref 80.0–100.0)
Platelets: 245 10*3/uL (ref 150–400)
RBC: 4.08 MIL/uL (ref 3.87–5.11)
RDW: 14.6 % (ref 11.5–15.5)
WBC: 6 10*3/uL (ref 4.0–10.5)
nRBC: 0 % (ref 0.0–0.2)

## 2019-06-19 LAB — HEMOGLOBIN A1C
Hgb A1c MFr Bld: 6.2 % — ABNORMAL HIGH (ref 4.8–5.6)
Mean Plasma Glucose: 131.24 mg/dL

## 2019-06-19 LAB — GLUCOSE, CAPILLARY
Glucose-Capillary: 83 mg/dL (ref 70–99)
Glucose-Capillary: 82 mg/dL (ref 70–99)
Glucose-Capillary: 137 mg/dL — ABNORMAL HIGH (ref 70–99)
Glucose-Capillary: 94 mg/dL (ref 70–99)

## 2019-06-19 MED ORDER — LEVETIRACETAM 500 MG PO TABS
500.0000 mg | ORAL_TABLET | Freq: Two times a day (BID) | ORAL | Status: DC
  Administered 2019-06-19 – 2019-06-20 (×2): 500 mg via ORAL
  Filled 2019-06-19 (×2): qty 1

## 2019-06-19 MED ORDER — AMLODIPINE BESYLATE 5 MG PO TABS
5.0000 mg | ORAL_TABLET | Freq: Every day | ORAL | Status: DC
  Administered 2019-06-19 – 2019-06-20 (×2): 5 mg via ORAL
  Filled 2019-06-19 (×2): qty 1

## 2019-06-19 MED ORDER — APIXABAN 5 MG PO TABS
5.0000 mg | ORAL_TABLET | Freq: Two times a day (BID) | ORAL | Status: DC
  Administered 2019-06-19 – 2019-06-20 (×2): 5 mg via ORAL
  Filled 2019-06-19 (×2): qty 1

## 2019-06-19 MED ORDER — IBUPROFEN 200 MG PO TABS
400.0000 mg | ORAL_TABLET | Freq: Four times a day (QID) | ORAL | Status: DC | PRN
  Administered 2019-06-19 – 2019-06-20 (×2): 400 mg via ORAL
  Filled 2019-06-19 (×2): qty 2

## 2019-06-19 NOTE — Evaluation (Signed)
Physical Therapy Evaluation Patient Details Name: Glenda Blankenship MRN: [id number] DOB: 06-May-1940 Today's Date: 06/19/2019   History of Present Illness  (P) Pt is a 79 yo female presenting for progressive bilateral LE numbness and tremoring with one episode of tonic-clonic seizure with EMS. Upon admission pt dx with PRES, and new onset seizure disorder.  PMH includes: saddle PE, DVT on eliquis, DM II, CKD III, and HTN.  Clinical Impression  Pt in bed upon arrival of PT, agreeable to evaluation at this time. Prior to admission the pt was completely independent, living alone without assist for any mobility or ADLs. The pt now presents with limitations in functional mobility, power, endurance, and activity tolerance due to above dx and resulting pain, and will continue to benefit from skilled PT to address these deficits. The pt was significantly limited in all mobility during todays session by significant back pain that onset with any/all movement. The pt was able to demo bed mobility and sit-stand transfer with modA for power up and stability, but declined further mobility at this time due to pain. Due to the pt's prior level of independence, she will continue to benefit from skilled PT and progression of mobility prior to d/c home. If she is unable to demo mobility with improved independence, SNF-level rehab may be needed prior to returning home to living alone.      Follow Up Recommendations Home health PT;Supervision/Assistance - 24 hour    Equipment Recommendations  Rolling walker with 5" wheels;3in1 (PT)    Recommendations for Other Services       Precautions / Restrictions Precautions Precautions: (P) Fall Restrictions Weight Bearing Restrictions: (P) No      Mobility  Bed Mobility Overal bed mobility: Needs Assistance Bed Mobility: Rolling;Sidelying to Sit;Sit to Sidelying Rolling: Mod assist Sidelying to sit: Mod assist     Sit to sidelying: Mod assist General bed mobility  comments: modA for all bed mobilty, likely due to pt back pain, grimacing and moaning with every movement despite use of log roll for comfort and assist rolling  Transfers Overall transfer level: Needs assistance Equipment used: Rolling walker (2 wheeled) Transfers: Sit to/from Stand Sit to Stand: Mod assist         General transfer comment: pt completed one sit-stand and then refused further mobiltiy due to dizziness and back pain. modA to power up with heavy use of RW  Ambulation/Gait                Stairs            Wheelchair Mobility    Modified Rankin (Stroke Patients Only)       Balance Overall balance assessment: Needs assistance Sitting-balance support: Single extremity supported;Feet supported Sitting balance-Leahy Scale: Poor Sitting balance - Comments: modA given in sitting due to pt sig post lean and movements due to her back pain   Standing balance support: Bilateral upper extremity supported Standing balance-Leahy Scale: Poor Standing balance comment: modA with BUE support                             Pertinent Vitals/Pain Pain Assessment: Faces Pain Score: 4  Faces Pain Scale: Hurts whole lot Pain Location: back with movement only Pain Descriptors / Indicators: Sharp;Grimacing Pain Intervention(s): Limited activity within patient's tolerance;Monitored during session;Repositioned;Patient requesting pain meds-RN notified    Home Living Family/patient expects to be discharged to:: (P) Private residence Living Arrangements: Alone Available Help at  Discharge: Family;Available 24 hours/day(pt reports multiple family members who could stay with her after d/c if needed) Type of Home: House Home Access: Level entry     Home Layout: One level   Additional Comments: pt reports no need for AD, does not own any equipment    Prior Function Level of Independence: Independent         Comments: pt reports she is still driving, living  completely independently PTA. Reports 0 falls in last 6 months     Hand Dominance   Dominant Hand: Right    Extremity/Trunk Assessment   Upper Extremity Assessment Upper Extremity Assessment: Overall WFL for tasks assessed    Lower Extremity Assessment Lower Extremity Assessment: Overall WFL for tasks assessed(generally functional, limited movement due to pain in back)    Cervical / Trunk Assessment Cervical / Trunk Assessment: Kyphotic  Communication   Communication: No difficulties  Cognition Arousal/Alertness: Awake/alert Behavior During Therapy: WFL for tasks assessed/performed Overall Cognitive Status: Within Functional Limits for tasks assessed                                 General Comments: pt generally within normal limits, able to follow multi step cues      General Comments General comments (skin integrity, edema, etc.): pt sig limited by back pain today, reports it started last night, RN notified    Exercises     Assessment/Plan    PT Assessment Patient needs continued PT services  PT Problem List Decreased strength;Decreased mobility;Decreased coordination;Decreased activity tolerance;Decreased balance;Pain;Cardiopulmonary status limiting activity       PT Treatment Interventions DME instruction;Therapeutic exercise;Gait training;Functional mobility training;Therapeutic activities;Patient/family education;Neuromuscular re-education;Balance training    PT Goals (Current goals can be found in the Care Plan section)  Acute Rehab PT Goals Patient Stated Goal: return home without need for RW PT Goal Formulation: With patient Time For Goal Achievement: 07/03/19 Potential to Achieve Goals: Good    Frequency Min 3X/week   Barriers to discharge Decreased caregiver support pt lives alone and has people who can check on her, but currently needs modA and 24/7 supervision    Co-evaluation               AM-PAC PT "6 Clicks" Mobility   Outcome Measure Help needed turning from your back to your side while in a flat bed without using bedrails?: A Lot Help needed moving from lying on your back to sitting on the side of a flat bed without using bedrails?: A Lot Help needed moving to and from a bed to a chair (including a wheelchair)?: A Lot Help needed standing up from a chair using your arms (e.g., wheelchair or bedside chair)?: A Lot Help needed to walk in hospital room?: Total Help needed climbing 3-5 steps with a railing? : Total 6 Click Score: 10    End of Session Equipment Utilized During Treatment: Gait belt Activity Tolerance: Patient limited by pain Patient left: in bed;with call bell/phone within reach;with bed alarm set Nurse Communication: Mobility status;Patient requests pain meds PT Visit Diagnosis: Difficulty in walking, not elsewhere classified (R26.2);Muscle weakness (generalized) (M62.81);Pain Pain - Right/Left: Right Pain - part of body: (back)    Time: 9937-1696 PT Time Calculation (min) (ACUTE ONLY): 42 min   Charges:   PT Evaluation $PT Eval Moderate Complexity: 1 Mod PT Treatments $Gait Training: 8-22 mins $Therapeutic Activity: 8-22 mins        Kaile Bixler  Lynnell Chad, DPT   Acute Rehabilitation Department Pager #: [phone]  Gaetana Michaelis 06/19/2019, 11:37 AM

## 2019-06-19 NOTE — Progress Notes (Signed)
OT Cancellation Note  Patient Details Name: Glenda Blankenship MRN: [id number] DOB: [birth year]   Cancelled Treatment:    Reason Eval/Treat Not Completed: Patient declined, no reason specified;Other (comment)(pain). Pt initially agreeable to OT evaluation with PLOF obtained. Pt then refusing therapy due to pain. Pt premedicated with no additional pain meds available. Educated pt on importance of daily activity and sitting upright throughout the day with pt continuing to refuse due to back pain. Pt's son in room with pt. Pt and son requesting therapy (OT and PT) see pt together in future to decrease strain on pt. RN updated. OT will follow up tomorrow as time allows.   Time Spent with pt: 15 minutes.  Peterson Ao 06/19/2019, 11:50 AM

## 2019-06-19 NOTE — Progress Notes (Signed)
Subjective: No acute events overnight.  No further seizure-like episodes.  No new concerns.   ROS: negative except above  Examination  Vital signs in last 24 hours: Temp:  [98.1 F (36.7 C)-99.5 F (37.5 C)] 98.4 F (36.9 C) (04/21 1159) Pulse Rate:  [78-102] 78 (04/21 1159) Resp:  [10-23] 20 (04/21 1159) BP: (131-183)/(52-80) 162/65 (04/21 1159) SpO2:  [90 %-98 %] 96 % (04/21 1159) Weight:  [73.3 kg] 73.3 kg (04/20 1905)  General: lying in bed, not in apparent distress CVS: pulse-normal rate and rhythm RS: breathing comfortably, CTA B Extremities: normal, warm  Neuro: MS: Alert, oriented, follows commands CN: pupils equal and reactive,  EOMI, face symmetric, tongue midline, normal sensation over face, Motor: 4+/5 strength in bilateral upper extremities, 3/5 in bilateral lower extremities  Basic Metabolic Panel: Recent Labs  Lab 06/17/19 1825 06/17/19 1834 06/18/19 1158 06/19/19 0409  NA 138 139 141 139  K 4.2 4.2 3.7 3.5  CL 105 104 108 106  CO2 23  --  26 25  GLUCOSE 103* 99 89 98  BUN 12 15 10 12   CREATININE 0.84 0.80 0.97 0.96  CALCIUM 8.7*  --  8.9 9.0    CBC: Recent Labs  Lab 06/17/19 1825 06/17/19 1834 06/18/19 1158 06/19/19 0409  WBC 6.4  --  5.2 6.0  NEUTROABS 4.3  --   --   --   HGB 10.9* 11.6* 10.4* 10.8*  HCT 36.3 34.0* 34.7* 36.1  MCV 89.9  --  89.2 88.5  PLT 248  --  253 245     Coagulation Studies: No results for input(s): LABPROT, INR in the last 72 hours.  Imaging No new brain imaging overnight  ASSESSMENT AND PLAN: 79 year old female presented with new onset seizure-like episodes and blood pressure of 196/86.  Stat EEG showed convulsive's status epilepticus which resolved after IV Ativan and Keppra.  MRI brain showed PRES.   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  Recommendations - Continue Keppra 500 mg twice daily - Goal SBP less than 140 for management of PRES, as needed IV labetalol ordered - Continue seizure precautions including do not drive after discharge -  PRN IV Ativan for clinical seizure-like activity lasting more than 2 minutes - Will require follow-up with neurology in 8 to 12 weeks after discharge - Patient will likely not need Keppra long-term if repeat MRI in few months shows complete resolution and patient's blood pressure is well managed -Discussed seizure precautions, side effects of Keppra, seizure provoking factors including the importance of blood pressure control with patient and son at bedside.  Seizure precautions: Per [facility] statutes, patients with seizures are not allowed to drive until they have been seizure-free for six months and cleared by a physician    Use caution when using heavy equipment or power tools. Avoid working on ladders or at heights. Take showers instead of baths. Ensure the water temperature is not too high on the home water heater. Do not go swimming alone. Do not lock yourself in a room alone (i.e. bathroom). When caring for infants or small children, sit down when holding, feeding, or changing them to minimize risk of injury to the child in the event you have a seizure. Maintain good sleep hygiene. Avoid alcohol.    If patient has another seizure, call 911 and bring them back  to the ED if: A.  The seizure lasts longer than 5 minutes.      B.  The patient doesn't wake shortly after the seizure or has new problems such as difficulty seeing, speaking or moving following the seizure C.  The patient was injured during the seizure D.  The patient has a temperature over 102 F (39C) E.  The patient vomited during the seizure and now is having trouble breathing    During the Seizure   - First, ensure adequate ventilation  and place patients on the floor on their left side  Loosen clothing around the neck and ensure the airway is patent. If the patient is clenching the teeth, do not force the mouth open with any object as this can cause severe damage - Remove all items from the surrounding that can be hazardous. The patient may be oblivious to what's happening and may not even know what he or she is doing. If the patient is confused and wandering, either gently guide him/her away and block access to outside areas - Reassure the individual and be comforting - Call 911. In most cases, the seizure ends before EMS arrives. However, there are cases when seizures may last over 3 to 5 minutes. Or the individual may have developed breathing difficulties or severe injuries. If a pregnant patient or a person with diabetes develops a seizure, it is prudent to call an ambulance. - Finally, if the patient does not regain full consciousness, then call EMS. Most patients will remain confused for about 45 to 90 minutes after a seizure, so you must use judgment in calling for help. - Avoid restraints but make sure the patient is in a bed with padded side rails - Place the individual in a lateral position with the neck slightly flexed; this will help the saliva drain from the mouth and prevent the tongue from falling backward - Remove all nearby furniture and other hazards from the area - Provide verbal assurance as the individual is regaining consciousness - Provide the patient with privacy if possible - Call for help and start treatment as ordered by the caregiver    After the Seizure (Postictal Stage)   After a seizure, most patients experience confusion, fatigue, muscle pain and/or a headache. Thus, one should permit the individual to sleep. For the next few days, reassurance is essential. Being calm and helping reorient the person is also of importance.   Most seizures are painless and end spontaneously. Seizures are not harmful  to others but can lead to complications such as stress on the lungs, brain and the heart. Individuals with prior lung problems may develop labored breathing and respiratory distress.    I have spent a total of 35  minuteswith the patient reviewing hospitalnotes,  test results, labs and examining the patient as well as establishing an assessment and plan that was discussed personally with the patient and physician.>50% of time was spent in direct patient care.   Lindie Spruce Epilepsy [facility] For questions after 5pm please refer to AMION to reach the Neurologist on call

## 2019-06-19 NOTE — Progress Notes (Signed)
PROGRESS NOTE  Glenda Blankenship  YWV:[id number] DOB: 06-19-1940 DOA: 06/17/2019 PCP: Carmel Sacramento, NP   Brief Narrative: Glenda Blankenship is a 79 y.o. 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 EMS was contacted and the patient continued to have tremors in the ED. BP 211/91 - 170/100. Antihypertensives, keppra were given as EEG confirms convulsive status epilepticus. MRI revealed changes consistent with PRES.   Assessment & Plan: Principal Problem:   Seizure (HCC) Active Problems:   Acute pulmonary embolism (HCC)   Type 2 diabetes mellitus (HCC)   HTN (hypertension)   CKD (chronic kidney disease) stage 3, GFR 30-59 ml/min   PRES (posterior reversible encephalopathy syndrome)  PRES:  - SBP goal <180mmHg. 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 Will continue keppra 500mg  BID with plans IV > PO once tolerating po. - Seizure precautions, driving restrictions after DC. - Ativan 2mg  IV prn seizure more than 2 minutes. - Needs neurology follow up in 8-12 weeks with repeat MRI. If BP is well-controlled and imaging shows resolution, may be able to DC keppra.    Acute encephalopathy: Multifactorial due to postictal state, PRES, and sedating medications for seizure.  -CRP is negative B12 and folic acid were within normal.  Head CT showed no acute abnormalities, no stroke on CT or MRI.  Ambulatory dysfunction/generalized weakness:  - Remains despite improvement in mentation.  - PT/OT following, notable ambulatory dysfunction ongoing today, tentative disposition SNF given PT is early recommendations  HTN:  - Manage BP  with home lisinopril/HCTZ, goal as above is SBP <124mmHg.   Stage IIIa CKD: SCr at baseline.  - Can DC IVF once taking po  History of saddle PE:  - Continue eliquis  History of T2DM: - No significant hyperglycemia since arrival, last HbA1c in 2019 was only 6.2%.  - Recheck HbA1c.   DVT prophylaxis: Eliquis Code Status: Full Family Communication: None at bedside Disposition Plan:  Status is: Inpatient  Remains inpatient appropriate because:Altered mental status, IV treatments appropriate due to intensity of illness or inability to take PO and Inpatient level of care appropriate due to severity of illness   Dispo: The patient is from: Home              Anticipated d/c is to: Likely SNF given PT recommendations, ongoing ambulatory dysfunction and weakness as she lives alone at home with no support.  Certainly home health with physical therapy at home would be reasonable if patient can demonstrate ability to walk and perform ADLs without assistance.              Anticipated d/c date is: 2 days              Patient currently is not medically stable to d/c.  Consultants:   Neurology  Procedures:   LTM EEG  Antimicrobials:  None   Subjective: No acute issues or events overnight, denies chest pain, shortness of breath, nausea, vomiting, diarrhea, constipation, headache, fevers, chills.  She does indicate ongoing weakness although somewhat improving from admission.  Somewhat reluctant to work with physical therapy today but states "I will try"  Objective: Vitals:   06/18/19 2215 06/18/19 2311 06/19/19  0116 06/19/19 0352  BP: (!) 159/71 (!) 148/63 (!) 138/56 135/64  Pulse: 86 84 82 80  Resp:  16  18  Temp:  98.9 F (37.2 C)  98.1 F (36.7 C)  TempSrc:  Axillary  Oral  SpO2:  94%  96%  Weight:      Height:        Intake/Output Summary (Last 24 hours) at 06/19/2019 0725 Last data filed at 06/19/2019 4098 Gross per 24 hour  Intake 725 ml  Output 1150 ml  Net -425 ml    Filed Weights   06/17/19 1843 06/18/19 1905  Weight: 70.3 kg 73.3 kg    Gen: Tired-appearing female in no distress  Pulm: Non-labored breathing supplemental oxygen. Clear to auscultation bilaterally.  CV: Regular rate and rhythm. No murmur, rub, or gallop. No JVD, no pedal edema. GI: Abdomen soft, non-tender, non-distended, with normoactive bowel sounds. No organomegaly or masses felt. Ext: Warm, no deformities Skin: No rashes, lesions or ulcers Neuro: Alert and oriented. Can barely lift legs off bed, barely lifts arms at the elbow more than 6 inches, but is able to touch her nose when encouraged. No nystagmus, PERRL, arcus senilis noted.  Psych: Judgement and insight appear fair. Mood & affect appropriate.   Data Reviewed: I have personally reviewed following labs and imaging studies  CBC: Recent Labs  Lab 06/17/19 1825 06/17/19 1834 06/18/19 1158 06/19/19 0409  WBC 6.4  --  5.2 6.0  NEUTROABS 4.3  --   --   --   HGB 10.9* 11.6* 10.4* 10.8*  HCT 36.3 34.0* 34.7* 36.1  MCV 89.9  --  89.2 88.5  PLT 248  --  253 119   Basic Metabolic Panel: Recent Labs  Lab 06/17/19 1825 06/17/19 1834 06/18/19 1158 06/19/19 0409  NA 138 139 141 139  K 4.2 4.2 3.7 3.5  CL 105 104 108 106  CO2 23  --  26 25  GLUCOSE 103* 99 89 98  BUN 12 15 10 12   CREATININE 0.84 0.80 0.97 0.96  CALCIUM 8.7*  --  8.9 9.0   GFR: Estimated Creatinine Clearance: 44.6 mL/min (by C-G formula based on SCr of 0.96 mg/dL). Liver Function Tests: Recent Labs  Lab 06/17/19 1825 06/18/19 1158  AST 31 24  ALT 18 18  ALKPHOS 55 56  BILITOT 0.2* 0.4  PROT 7.0 6.7  ALBUMIN 3.4* 3.1*   No results for input(s): LIPASE, AMYLASE in the last 168 hours. No results for input(s): AMMONIA in the last 168 hours. Coagulation Profile: No results for input(s): INR, PROTIME in the last 168 hours. Cardiac Enzymes: No results for input(s): CKTOTAL, CKMB, CKMBINDEX, TROPONINI in the last 168 hours. BNP (last 3  results) No results for input(s): PROBNP in the last 8760 hours. HbA1C: Recent Labs    06/19/19 0409  HGBA1C 6.2*   CBG: Recent Labs  Lab 06/18/19 2126 06/19/19 0625  GLUCAP 107* 83   Lipid Profile: No results for input(s): CHOL, HDL, LDLCALC, TRIG, CHOLHDL, LDLDIRECT in the last 72 hours. Thyroid Function Tests: Recent Labs    06/18/19 1158  TSH 1.555   Anemia Panel: Recent Labs    06/17/19 1858  VITAMINB12 235  FOLATE 14.2   Urine analysis: No results found for: COLORURINE, APPEARANCEUR, LABSPEC, PHURINE, GLUCOSEU, HGBUR, BILIRUBINUR, KETONESUR, PROTEINUR, UROBILINOGEN, NITRITE, LEUKOCYTESUR Recent Results (from the past 240 hour(s))  SARS CORONAVIRUS 2 (TAT 6-24 HRS) Nasopharyngeal Nasopharyngeal Swab     Status: None   Collection Time: 06/18/19  12:56 AM   Specimen: Nasopharyngeal Swab  Result Value Ref Range Status   SARS Coronavirus 2 NEGATIVE NEGATIVE Final    Comment: (NOTE) SARS-CoV-2 target nucleic acids are NOT DETECTED. The SARS-CoV-2 RNA is generally detectable in upper and lower respiratory specimens during the acute phase of infection. Negative results do not preclude SARS-CoV-2 infection, do not rule out co-infections with other pathogens, and should not be used as the sole basis for treatment or other patient management decisions. Negative results must be combined with clinical observations, patient history, and epidemiological information. The expected result is Negative. Fact Sheet for Patients: HairSlick.no Fact Sheet for Healthcare Providers: quierodirigir.com This test is not yet approved or cleared by the [place] FDA and  has been authorized for detection and/or diagnosis of SARS-CoV-2 by FDA under an Emergency Use Authorization (EUA). This EUA will remain  in effect (meaning this test can be used) for the duration of the COVID-19 declaration under Section 56 4(b)(1) of the Act, 21  U.S.C. section 360bbb-3(b)(1), unless the authorization is terminated or revoked sooner. Performed at [facility], [address], [place], [place] [address]       Radiology Studies: EEG  Result Date: 06/17/2019 Charlsie Quest, MD     06/18/2019  9:31 AM Patient Name: ROSEALYNN MATEUS MRN: [id number] Epilepsy Attending: Charlsie Quest Referring Physician/Provider: Dr Caryl Pina Date: 06/17/2019 Duration: 37.49 mins Patient history: 79yo F presented with ams and seizure. EEG to evaluate for status epilepticus. Level of alertness: lethargic AEDs during EEG study: Ativan, Keppra Technical aspects: This EEG study was done with scalp electrodes positioned according to the 10-20 International system of electrode placement. Electrical activity was acquired at a sampling rate of 500Hz  and reviewed with a high frequency filter of 70Hz  and a low frequency filter of 1Hz . EEG data were recorded continuously and digitally stored. DESCRIPTION: At the beginning of the study, eeg showed continuous generalized and maximal left posterior quadrant 3-5hz  rhythmic theta -delta slowing with overriding 13-15hz  beta activity. Sharp waves were also seen in bilateral, left>right posterior quadrant at 2-2.5hz . Clinically patient was noted to have bilateral leg jerking as well as gaze deviation to left and then right. IV Ativan 2mg  was administered at 2119. After IV ativan , the rhythmic slowing slowly resolved by around 2134 and was replaced by low amplitude generalized 2-3 Hz delta slowing admixed with 13-15hz  beta activity. Hyperventilation and photic stimulation were not performed. ABNORMALITY - Multifocal convulsive status epilepticus, bilateral (Left>Right) posterior quadrant - Continuous slow, generalized - Beta activity, generalized IMPRESSION: This study initially showed evidence of convulsive status epilepticus arising from bilateral posterior quadrant, left>right. After IV ativan, seizure stopped at around 2134  and EEG was suggestive of severe diffuse encephalopathy, non specific to etiology but likely secondary to sedation. Dr was notified. 2120   CT Head Wo Contrast  Result Date: 06/17/2019 CLINICAL DATA:  New onset seizure EXAM: CT HEAD WITHOUT CONTRAST TECHNIQUE: Contiguous axial images were obtained from the base of the skull through the vertex without intravenous contrast. COMPARISON:  02/19/2018 FINDINGS: Brain: No acute intracranial abnormality. Specifically, no hemorrhage, hydrocephalus, mass lesion, acute infarction, or significant intracranial injury. Vascular: No hyperdense vessel or unexpected calcification. Skull: No acute calvarial abnormality. Sinuses/Orbits: Visualized paranasal sinuses and mastoids clear. Orbital soft tissues unremarkable. Other: None IMPRESSION: No acute intracranial abnormality. Electronically Signed   By: Otelia Limes M.D.   On: 06/17/2019 19:28   MR BRAIN WO CONTRAST  Result  Date: 06/17/2019 CLINICAL DATA:  Initial evaluation for acute seizure. EXAM: MRI HEAD WITHOUT CONTRAST TECHNIQUE: Multiplanar, multiecho pulse sequences of the brain and surrounding structures were obtained without intravenous contrast. COMPARISON:  Prior head CT from earlier the same day. FINDINGS: Brain: Cerebral volume within normal limits for age. Multifocal areas of confluent T2/FLAIR hyperintensity seen involving the cortical and subcortical aspect of the parasagittal frontal lobes as well as the parieto-occipital regions bilaterally. Involvement is fairly symmetric in appearance. Patchy involvement of the left greater than right cerebellar hemispheres present as well. Overall, appearance is most characteristic of PRES. No associated hemorrhage or mass effect. No evidence for acute infarct. Gray-white matter differentiation maintained with no other areas of remote cortical infarction. No foci of susceptibility artifact to suggest acute or chronic intracranial hemorrhage. No mass  lesion, mass effect, or midline shift. Ventricles normal size without hydrocephalus. No extra-axial fluid collection. Pituitary gland suprasellar region normal. Midline structures intact. No intrinsic temporal lobe abnormality. Vascular: Major intracranial vascular flow voids are maintained. Skull and upper cervical spine: Craniocervical junction within normal limits. Moderate multilevel cervical spondylosis noted within the visualized upper cervical spine with associated mild diffuse spinal stenosis. Bone marrow signal intensity within normal limits. No acute scalp soft tissue abnormality. Sinuses/Orbits: Patient status post bilateral ocular lens replacement. Paranasal sinuses are largely clear. No significant mastoid effusion. Inner ear structures grossly normal. Other: None. IMPRESSION: 1. Multifocal confluent T2/FLAIR signal abnormality involving the bilateral cerebral and cerebellar hemispheres as above. Given history of seizure, findings most characteristic for PRES and/or status epilepticus. 2. Otherwise normal brain MRI for age. Electronically Signed   By: Rise Mu M.D.   On: 06/17/2019 23:36   Overnight EEG with video  Result Date: 06/18/2019 Charlsie Quest, MD     06/18/2019 12:07 PM Patient Name: KYRSTIN CAMPILLO MRN: [id number] Epilepsy Attending: Charlsie Quest Referring Physician/Provider: Dr Caryl Pina Duration: 06/17/2019 2315 to 06/18/2019 1135  Patient history: 79yo F presented with ams and seizure. EEG to evaluate for status epilepticus.  Level of alertness: lethargic  AEDs during EEG study: Ativan, Keppra  Technical aspects: This EEG study was done with scalp electrodes positioned according to the 10-20 International system of electrode placement. Electrical activity was acquired at a sampling rate of 500Hz  and reviewed with a high frequency filter of 70Hz  and a low frequency filter of 1Hz . EEG data were recorded continuously and digitally stored.  DESCRIPTION: EEG showed  continuous low amplitude generalized 2-3 Hz delta slowing admixed with 13-15hz  beta activity. Hyperventilation and photic stimulation were not performed.  ABNORMALITY - Continuous slow, generalized - Beta activity, generalized  IMPRESSION: This study is suggestive of severe diffuse encephalopathy, non specific to etiology but likely secondary to sedation. No seizures or definite epileptiform discharges were seen.  Priyanka    Scheduled Meds: . apixaban  2.5 mg Oral BID  . hydrochlorothiazide  25 mg Oral Daily   And  . lisinopril  20 mg Oral Daily  . insulin aspart  0-5 Units Subcutaneous QHS  . insulin aspart  0-9 Units Subcutaneous TID WC   Continuous Infusions: . sodium chloride 75 mL/hr at 06/18/19 2023  . levETIRAcetam 500 mg (06/18/19 2117)     LOS: 2 days   Time spent: 25 minutes.  2024, DO [facility] [email or web address] 06/19/2019, 7:25 AM

## 2019-06-19 NOTE — Plan of Care (Signed)

## 2019-06-19 NOTE — Evaluation (Signed)
Clinical/Bedside Swallow Evaluation Patient Details  Name: Glenda Blankenship MRN: [id number] Date of Birth: 07/29/1940  Today's Date: 06/19/2019 Time: SLP Start Time (ACUTE ONLY): 4166 SLP Stop Time (ACUTE ONLY): 0827 SLP Time Calculation (min) (ACUTE ONLY): 8 min  Past Medical History:  Past Medical History:  Diagnosis Date  . Diabetes (HCC)   . DVT (deep vein thrombosis) in pregnancy   . Hypertension   . Pneumonia   . Renal disorder    Past Surgical History:  Past Surgical History:  Procedure Laterality Date  . kidney stone removal     HPI:  79 y.o. 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  EEG confirmed convulsive status epilepticus. MRI revealed changes consistent with PRES.   Assessment / Plan / Recommendation Clinical Impression  Pt presents with normal functional swallowing with adequate mastication, brisk swallow response, and no s/s of aspiration with large, successive boluses of water and mixed consistencies.  No dysphagia.  No focal CN deficits. Continue regular solids, thin liquids. No SLP f/u is needed.  SLP Visit Diagnosis: Dysphagia, unspecified (R13.10)    Aspiration Risk  No limitations    Diet Recommendation   regular solids, thin liquids  Medication Administration: Whole meds with liquid    Other  Recommendations     Follow up Recommendations None      Frequency and Duration            Prognosis        Swallow Study   General HPI: 79 y.o. 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  EEG confirmed convulsive status epilepticus. MRI revealed changes consistent with PRES. Type of Study: Bedside Swallow Evaluation Previous Swallow Assessment: no Diet Prior to this Study:  Regular;Thin liquids Temperature Spikes Noted: No Respiratory Status: Room air History of Recent Intubation: No Behavior/Cognition: Alert;Cooperative;Pleasant mood Oral Cavity Assessment: Within Functional Limits Oral Care Completed by SLP: No Oral Cavity - Dentition: Adequate natural dentition Vision: Functional for self-feeding Self-Feeding Abilities: Able to feed self Patient Positioning: Upright in bed Baseline Vocal Quality: Normal Volitional Cough: Strong Volitional Swallow: Able to elicit    Oral/Motor/Sensory Function Overall Oral Motor/Sensory Function: Within functional limits   Ice Chips Ice chips: Within functional limits   Thin Liquid Thin Liquid: Within functional limits    Nectar Thick Nectar Thick Liquid: Not tested   Honey Thick Honey Thick Liquid: Not tested   Puree Puree: Within functional limits   Solid     Solid: Within functional limits      Blenda Mounts Laurice 06/19/2019,8:28 AM   Marchelle Folks L. Samson Frederic, MA CCC/SLP Acute Rehabilitation Services Office number [phone] Pager [phone]

## 2019-06-20 LAB — GLUCOSE, CAPILLARY: Glucose-Capillary: 81 mg/dL (ref 70–99)

## 2019-06-20 MED ORDER — AMLODIPINE BESYLATE 5 MG PO TABS: 5.0000 mg | ORAL_TABLET | Freq: Every day | ORAL | 1 refills | Status: DC

## 2019-06-20 MED ORDER — LISINOPRIL-HYDROCHLOROTHIAZIDE 20-25 MG PO TABS: 1.0000 | ORAL_TABLET | Freq: Every day | ORAL | 1 refills | Status: DC

## 2019-06-20 MED ORDER — LEVETIRACETAM 500 MG PO TABS: 500.0000 mg | ORAL_TABLET | Freq: Two times a day (BID) | ORAL | 1 refills | Status: DC

## 2019-06-20 NOTE — Evaluation (Signed)
Occupational Therapy Evaluation Patient Details Name: Glenda Blankenship MRN: [id number] DOB: Apr 27, 1940 Today's Date: 06/20/2019    History of Present Illness Pt is a 79 yo female presenting for progressive bilateral LE numbness and tremoring with one episode of tonic-clonic seizure with EMS. Upon admission pt dx with PRES, and new onset seizure disorder.  PMH includes: saddle PE, DVT on eliquis, DM II, CKD III, and HTN.   Clinical Impression   Pt admitted with the above diagnosis and has the deficits outlined below. Pt would benefit from cont OT to increase safety and independence with all adls when on her feet. Pt lives alone and was previously independent but now is very unsteady on her feet with some significant LE weakness which makes her a significant fall risk.  Ideally, this pt would benefit from rehab prior to returning home to ensure a safe d/c, pt pt and grandson have stated that she would rather go home with home health services.  Explained to grandson and pt that she is at a high risk for a fall at home and rehab would be best option.  Pt did recognize that she has balance issues.  Family feels that pt's sister may be able to provide 24/7 assist. If not, pt will need rehab consult.  Rehab would be appropriate as pt was previously independent and lived alone but also has family to assist if needed.      Follow Up Recommendations  Home health OT;Supervision/Assistance - 24 hour    Equipment Recommendations  Tub/shower seat    Recommendations for Other Services       Precautions / Restrictions Precautions Precautions: Fall Precaution Comments: Pt walked in hallway with therapists.  Ifr therapy was not there, pt would have had multiple falls. Restrictions Weight Bearing Restrictions: No Other Position/Activity Restrictions: Pt very unsteady on feet and slight buckling of R knee when walking with and without the walker.      Mobility Bed Mobility Overal bed mobility: Modified  Independent Bed Mobility: Supine to Sit     Supine to sit: Modified independent (Device/Increase time)     General bed mobility comments: increased time to get up.  Bed was flat and pt did not use bedrails.  Transfers Overall transfer level: Needs assistance Equipment used: 1 person hand held assist Transfers: Sit to/from [facility] Sit to Stand: Min assist Stand pivot transfers: Min assist       General transfer comment: Pt did well getting to her feet.  Once on her feet, pt has great difficulty with any challenges.    Balance Overall balance assessment: Needs assistance Sitting-balance support: Feet supported Sitting balance-Leahy Scale: Good     Standing balance support: Bilateral upper extremity supported Standing balance-Leahy Scale: Poor Standing balance comment: Pt relied heavily on outssid support to remain standing due to LE leg weakness and incoordination.                           ADL either performed or assessed with clinical judgement   ADL Overall ADL's : Needs assistance/impaired Eating/Feeding: Independent;Sitting   Grooming: Wash/dry hands;Wash/dry face;Min guard;Standing Grooming Details (indicate cue type and reason): Pt stood at sink for 3 minutes to groom. Pt safe when leaning on the sink.  Min guard needed when not propping on the sink. Upper Body Bathing: Set up;Sitting   Lower Body Bathing: Minimal assistance;Sit to/from stand;Cueing for compensatory techniques Lower Body Bathing Details (indicate cue type and reason):  min assist when in standing due to poor balance. Upper Body Dressing : Set up;Sitting   Lower Body Dressing: Minimal assistance;Sit to/from stand;Cueing for compensatory techniques Lower Body Dressing Details (indicate cue type and reason): min assist needed in standing to pull pants up. Pt unsafe when not supported by walker. Toilet Transfer: Minimal assistance;Ambulation;RW;Cueing for safety Toilet  Transfer Details (indicate cue type and reason): Pt walked to bathroom with walker with overall min assist bu occasional mod assist for balance.   Toileting- Clothing Manipulation and Hygiene: Min guard;Cueing for compensatory techniques;Sitting/lateral lean Toileting - Clothing Manipulation Details (indicate cue type and reason): more assist needed when pt stands due to poor balanc.   Tub/Shower Transfer Details (indicate cue type and reason): Pt states she sits in bottom of tub to bathe.  Pt will need to be supervised when showering and should use shower chair. Functional mobility during ADLs: Minimal assistance;Rolling walker;+2 for safety/equipment General ADL Comments: Pt limited with safety during adls due to poor balance and leg weakness. Pt will fall if she is up alone.  Use of walker did not help balance much.  If pt chooses to return home, she will need 24 hour assist. Pt will not be able to get up alone without risking a fall.  Feel rehab would be a perfect fit, but pt feels she would like to go home with family assisting.     Vision Baseline Vision/History: No visual deficits Patient Visual Report: No change from baseline Vision Assessment?: No apparent visual deficits     Perception Perception Perception Tested?: No   Praxis Praxis Praxis tested?: Within functional limits    Pertinent Vitals/Pain Pain Assessment: Faces Faces Pain Scale: Hurts little more Pain Location: back and R knee. Pain Descriptors / Indicators: Sharp;Grimacing Pain Intervention(s): Limited activity within patient's tolerance;Monitored during session;Repositioned     Hand Dominance Right   Extremity/Trunk Assessment Upper Extremity Assessment Upper Extremity Assessment: Overall WFL for tasks assessed   Lower Extremity Assessment Lower Extremity Assessment: Defer to PT evaluation   Cervical / Trunk Assessment Cervical / Trunk Assessment: Kyphotic   Communication Communication Communication: No  difficulties   Cognition Arousal/Alertness: Awake/alert Behavior During Therapy: WFL for tasks assessed/performed Overall Cognitive Status: Within Functional Limits for tasks assessed                                 General Comments: pt generally within normal limits, able to follow multi step cues   General Comments  Pt would be best served by some level of rehab, but pt currently states she wants to go home with 24 hour supervision.  Pt is a significant fall risk.    Exercises     Shoulder Instructions      Home Living Family/patient expects to be discharged to:: Private residence Living Arrangements: Alone Available Help at Discharge: Family;Available 24 hours/day Type of Home: House Home Access: Level entry     Home Layout: One level     Bathroom Shower/Tub: Tub/shower unit;Curtain   Bathroom Toilet: Standard     Home Equipment: None   Additional Comments: Pt needs tub chair.      Prior Functioning/Environment Level of Independence: Independent        Comments: Pt independent in all ADL, IADL, and mobility tasks. Pt does not ambulate with an AD and reports 0 falls in the last 6 months. Pt still drives.  OT Problem List: Impaired balance (sitting and/or standing);Decreased knowledge of use of DME or AE;Pain;Decreased coordination      OT Treatment/Interventions:      OT Goals(Current goals can be found in the care plan section) Acute Rehab OT Goals Patient Stated Goal: return home without need for RW OT Goal Formulation: With patient Time For Goal Achievement: 07/04/19 Potential to Achieve Goals: Good ADL Goals Pt Will Perform Grooming: with supervision;standing Pt Will Perform Lower Body Bathing: with supervision;sit to/from stand Pt Will Perform Lower Body Dressing: with supervision;sit to/from stand Additional ADL Goal #1: Pt will walk to bathroom and complete all toileting on regular commode with rail and  supervisionl Additional ADL Goal #2: Pt will transfer to bottom of tub to bathe with supervision(personal goal).  OT Frequency: Min 2X/week   Barriers to D/C:    Pt states her sister can be there to help 24/7       Co-evaluation PT/OT/SLP Co-Evaluation/Treatment: Yes Reason for Co-Treatment: Necessary to address cognition/behavior during functional activity PT goals addressed during session: Mobility/safety with mobility OT goals addressed during session: ADL's and self-care      AM-PAC OT "6 Clicks" Daily Activity     Outcome Measure Help from another person eating meals?: None Help from another person taking care of personal grooming?: A Little Help from another person toileting, which includes using toliet, bedpan, or urinal?: A Little Help from another person bathing (including washing, rinsing, drying)?: A Little Help from another person to put on and taking off regular upper body clothing?: None Help from another person to put on and taking off regular lower body clothing?: A Little 6 Click Score: 20   End of Session Equipment Utilized During Treatment: Gait belt;Rolling walker Nurse Communication: Mobility status  Activity Tolerance: Patient tolerated treatment well Patient left: in chair;with call bell/phone within reach;with chair alarm set;with family/visitor present  OT Visit Diagnosis: Unsteadiness on feet (R26.81)                Time: 6568-1275 OT Time Calculation (min): 30 min Charges:  OT General Charges $OT Visit: 1 Visit OT Evaluation $OT Eval Moderate Complexity: 1 Mod  Hope Budds 06/20/2019, 9:29 AM

## 2019-06-20 NOTE — Plan of Care (Signed)

## 2019-06-20 NOTE — TOC Transition Note (Signed)
Transition of Care [facility]) - CM/SW Discharge Note   Patient Details  Name: Glenda Blankenship MRN: [id number] Date of Birth: January 16, 1941  [facility] CM/SW Contact:  Kermit Balo, RN Phone Number: 06/20/2019, 11:33 AM   Clinical Narrative:    Pt discharging home with [facility] services through [place]. Cory with [facility] accepted the referral.  Pt states she will have family to stay with her especially her sister to provide needed supervision.  Son to provide transport home today. CM consulted for medication needs. Pt just states she could not get a prescription for BP meds from [facility]. She is leaving with prescription for BP med and will schedule a f/u visit with her PCP.    Final next level of care: Home w Home Health Services Barriers to Discharge: No Barriers Identified   Patient Goals and CMS Choice   CMS Medicare.gov Compare Post Acute Care list provided to:: Patient Choice offered to / list presented to : Patient  Discharge Placement                       Discharge Plan and Services                          HH Arranged: PT, OT [facility] Agency: [facility] Date [facility] Agency Contacted: 06/20/19   Representative spoke with at [facility] Agency: Kandee Keen  Social Determinants of Health (SDOH) Interventions     Readmission Risk Interventions No flowsheet data found.

## 2019-06-20 NOTE — Progress Notes (Signed)
Patient D/C home with son and [facility]

## 2019-06-20 NOTE — Plan of Care (Signed)
Adequate for discharge.

## 2019-06-20 NOTE — Progress Notes (Signed)
Physical Therapy Treatment Patient Details Name: Glenda Blankenship MRN: [id number] DOB: [birth year] Today's Date: 06/20/2019    History of Present Illness Pt is a 79 yo female presenting for progressive bilateral LE numbness and tremoring with one episode of tonic-clonic seizure with EMS. Upon admission pt dx with PRES, and new onset seizure disorder.  PMH includes: saddle PE, DVT on eliquis, DM II, CKD III, and HTN.    PT Comments    Patient in bed upon arrival and agreeable to participate in therapy. Pt presents with impaired balance and gait incoordination resulting in multiple LOB during session requiring assist from therapist to prevent falls. Pt requires min-mod A for gait training with and without AD. Pt will need 24 hour supervision/assistance and physical assistance for OOB mobility. Pt will continue to benefit from further skilled PT services to maximize independence and decrease risk of falls. Pt and pt's grandson verbalize understanding of need for physical assistance upon d/c home.      Follow Up Recommendations  Home health PT;Supervision/Assistance - 24 hour (if family unable to provide 24 hour assist will need post acute rehab given fall risk)      Equipment Recommendations  Rolling walker with 5" wheels;3in1 (PT)    Recommendations for Other Services       Precautions / Restrictions Precautions Precautions: Fall Precaution Comments: Pt walked in hallway with therapists.  If therapy was not there, pt would have had multiple falls. Restrictions Weight Bearing Restrictions: No Other Position/Activity Restrictions: Pt very unsteady on feet and slight buckling of R knee when walking with and without the walker.    Mobility  Bed Mobility Overal bed mobility: Modified Independent Bed Mobility: Supine to Sit     Supine to sit: Modified independent (Device/Increase time)     General bed mobility comments: increased time to get up.  Bed was flat and pt did not use  bedrails.  Transfers Overall transfer level: Needs assistance Equipment used: 1 person hand held assist Transfers: Sit to/from [facility] Sit to Stand: Min assist Stand pivot transfers: Min assist       General transfer comment: pt able to power up into standing with min guard for safety and min A required upon stand due to impaired balance   Ambulation/Gait Ambulation/Gait assistance: Mod assist;Min assist;+2 safety/equipment Gait Distance (Feet): 120 Feet Assistive device: Rolling walker (2 wheeled);1 person hand held assist(assist at trunk with gait belt) Gait Pattern/deviations: Step-through pattern;Ataxic Gait velocity: decreased   General Gait Details: ?apraxia vs ataxia; particularly difficult to mobilize R LE and with genu recurvatum noted during stance phase; pt with R knee buckling and impaired balance; pt requires assistance to maintain balance with and without AD; mod A with HHA and gait belt wihtout RW; pt with multiple LOB requiring assistance to prevent falls    Stairs             Wheelchair Mobility    Modified Rankin (Stroke Patients Only)       Balance Overall balance assessment: Needs assistance Sitting-balance support: Feet supported Sitting balance-Leahy Scale: Good     Standing balance support: Bilateral upper extremity supported Standing balance-Leahy Scale: Poor Standing balance comment: Pt relied heavily on outside support to remain standing due to LE leg weakness and incoordination.                            Cognition Arousal/Alertness: Awake/alert Behavior During Therapy: WFL for tasks assessed/performed Overall  Cognitive Status: Within Functional Limits for tasks assessed                                 General Comments: pt generally within normal limits, able to follow multi step cues      Exercises      General Comments General comments (skin integrity, edema, etc.): Pt would be best  served by some level of rehab, but pt currently states she wants to go home with 24 hour supervision.  Pt is a significant fall risk.      Pertinent Vitals/Pain Pain Assessment: Faces Faces Pain Scale: Hurts little more Pain Location: back and R knee. Pain Descriptors / Indicators: Sharp;Grimacing Pain Intervention(s): Limited activity within patient's tolerance;Monitored during session;Repositioned    Home Living Family/patient expects to be discharged to:: Private residence Living Arrangements: Alone Available Help at Discharge: Family;Available 24 hours/day Type of Home: House Home Access: Level entry   Home Layout: One level Home Equipment: None Additional Comments: Pt needs tub chair.    Prior Function Level of Independence: Independent      Comments: Pt independent in all ADL, IADL, and mobility tasks. Pt does not ambulate with an AD and reports 0 falls in the last 6 months. Pt still drives.    PT Goals (current goals can now be found in the care plan section) Acute Rehab PT Goals Patient Stated Goal: return home without need for RW Progress towards PT goals: Progressing toward goals    Frequency    Min 3X/week      PT Plan Current plan remains appropriate    Co-evaluation PT/OT/SLP Co-Evaluation/Treatment: Yes Reason for Co-Treatment: Necessary to address cognition/behavior during functional activity;For patient/therapist safety;To address functional/ADL transfers PT goals addressed during session: Mobility/safety with mobility OT goals addressed during session: ADL's and self-care      AM-PAC PT "6 Clicks" Mobility   Outcome Measure  Help needed turning from your back to your side while in a flat bed without using bedrails?: None Help needed moving from lying on your back to sitting on the side of a flat bed without using bedrails?: None Help needed moving to and from a bed to a chair (including a wheelchair)?: A Lot Help needed standing up from a chair  using your arms (e.g., wheelchair or bedside chair)?: A Lot Help needed to walk in hospital room?: A Lot Help needed climbing 3-5 steps with a railing? : A Lot 6 Click Score: 16    End of Session Equipment Utilized During Treatment: Gait belt Activity Tolerance: Patient tolerated treatment well Patient left: in chair;with call bell/phone within reach;with chair alarm set;with family/visitor present Nurse Communication: Mobility status PT Visit Diagnosis: Difficulty in walking, not elsewhere classified (R26.2);Muscle weakness (generalized) (M62.81);Pain Pain - Right/Left: Right Pain - part of body: (back)     Time: 1914-7829 PT Time Calculation (min) (ACUTE ONLY): 30 min  Charges:  $Gait Training: 8-22 mins                     Erline Levine, PTA Acute Rehabilitation Services Pager: [phone] Office: [phone]     Carolynne Edouard 06/20/2019, 11:13 AM

## 2019-06-20 NOTE — Discharge Summary (Signed)
Physician Discharge Summary  Glenda Blankenship DUK:[id number] DOB: Jul 16, 1940 DOA: 06/17/2019  PCP: Emelia Loron, NP  Admit date: 06/17/2019 Discharge date: 06/20/2019  Admitted From: Home Disposition: Home  Recommendations for Outpatient Follow-up:  1. Follow up with PCP in 1-2 weeks 2. Please obtain BMP/CBC in one week  Home Health: PT/OT  Discharge Condition: Stable CODE STATUS: Full Diet recommendation: As tolerated  Brief/Interim Summary: Glenda Blankenship is a 79 y.o. 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 EMS was contacted and the patient continued to have tremors in the ED. BP 211/91 - 170/100. Antihypertensives, keppra were given as EEG confirms convulsive status epilepticus. MRI revealed changes consistent with PRES.  Patient met as above with acute onset seizure in the setting of pres due to hypertensive emergency.  Patient was placed on Keppra by neurology, will need to continue this for at least 12 weeks with repeat MRI, at that time if MRI appears to be resolved and blood pressure is well controlled they will be able to discuss discontinuation of Keppra.  In the meantime patient's blood pressure was moderately well controlled on home medication lisinopril/HCTZ however we did add low-dose amlodipine 5 for tighter control given patient's provoked seizure as above.  Patient otherwise stable and agreeable for discharge, follow with home health given ongoing ambulatory dysfunction as well as with PCP in the next 1 to 2 weeks for further evaluation and treatment of hypertension to ensure well-controlled blood pressure.  She will follow with neurology in 8 to 12 weeks as above for repeat MRI and evaluation for discontinuation of Keppra.   Discharge Diagnoses:  Principal Problem:   Seizure [facility]) Active Problems:   Acute pulmonary embolism (HCC)    Type 2 diabetes mellitus (HCC)   HTN (hypertension)   CKD (chronic kidney disease) stage 3, GFR 30-59 ml/min   PRES (posterior reversible encephalopathy syndrome)    Discharge Instructions  Discharge Instructions    Call MD for:  difficulty breathing, headache or visual disturbances   Complete by: As directed    Call MD for:  extreme fatigue   Complete by: As directed    Call MD for:  hives   Complete by: As directed    Call MD for:  persistant dizziness or light-headedness   Complete by: As directed    Call MD for:  persistant nausea and vomiting   Complete by: As directed    Call MD for:  severe uncontrolled pain   Complete by: As directed    Call MD for:  temperature >100.4   Complete by: As directed    Diet - low sodium heart healthy   Complete by: As directed    Increase activity slowly   Complete by: As directed    Increase activity slowly   Complete by: As directed      Allergies as of 06/20/2019   No Known Allergies     Medication List    STOP taking these medications   docusate sodium 100 MG capsule Commonly known as: COLACE   ferrous sulfate 325 (65 FE) MG tablet     TAKE these medications   acetaminophen 325 MG tablet Commonly known as: TYLENOL Take 2 tablets (650 mg total) by mouth every 6 (six) hours as needed for mild pain, fever or headache.   amLODipine 5 MG tablet Commonly known as: NORVASC Take 1 tablet (5  mg total) by mouth daily. Start taking on: June 21, 2019   Eliquis 5 MG Tabs tablet Generic drug: apixaban TAKE 1 TABLET BY MOUTH TWICE DAILY *( NEEDS OFFICE VISIT FOR FUTURE REFILLS )* ( NO MORE REFILLS UNTIL PATIENT HAS AN APPOINTMENT ) * What changed: See the new instructions.   hydrocerin Crea Apply 1 application topically daily. Apply to areas of skin rash on face, neck, arm.   levETIRAcetam 500 MG tablet Commonly known as: KEPPRA Take 1 tablet (500 mg total) by mouth 2 (two) times daily.   lisinopril-hydrochlorothiazide  20-25 MG tablet Commonly known as: ZESTORETIC Take 1 tablet by mouth daily.      Follow-up Information    [facility] Follow up.   Specialty: [place] Why: The home health agency will contact you for the first home visit Contact information: [address] [place] [address] [phone]          No Known Allergies  Consultations:  Neurology, Dr. Hortense Ramal   Procedures/Studies: EEG  Result Date: 06/17/2019 Lora Havens, MD     06/18/2019  9:31 AM Patient Name: Glenda Blankenship MRN: [id number] Epilepsy Attending: Lora Havens Referring Physician/Provider: Dr Kerney Elbe Date: 06/17/2019 Duration: 37.49 mins Patient history: 79yo F presented with ams and seizure. EEG to evaluate for status epilepticus. Level of alertness: lethargic AEDs during EEG study: Ativan, Keppra Technical aspects: This EEG study was done with scalp electrodes positioned according to the 10-20 International system of electrode placement. Electrical activity was acquired at a sampling rate of _0  and reviewed with a high frequency filter of _1  and a low frequency filter of _2 . EEG data were recorded continuously and digitally stored. DESCRIPTION: At the beginning of the study, eeg showed continuous generalized and maximal left posterior quadrant 3-_3  rhythmic theta -delta slowing with overriding 13-_4  beta activity. Sharp waves were also seen in bilateral, left>right posterior quadrant at 2-2._5 . Clinically patient was noted to have bilateral leg jerking as well as gaze deviation to left and then right. IV Ativan 37m was administered at 2119. After IV ativan , the rhythmic slowing slowly resolved by around 2134 and was replaced by low amplitude generalized 2-3 Hz delta slowing admixed with 13-_6  beta activity. Hyperventilation and photic stimulation were not performed. ABNORMALITY - Multifocal convulsive status epilepticus, bilateral (Left>Right) posterior quadrant -  Continuous slow, generalized - Beta activity, generalized IMPRESSION: This study initially showed evidence of convulsive status epilepticus arising from bilateral posterior quadrant, left>right. After IV ativan, seizure stopped at around 2134 and EEG was suggestive of severe diffuse encephalopathy, non specific to etiology but likely secondary to sedation. Dr LCheral Markerwas notified. PLora Havens  CT Head Wo Contrast  Result Date: 06/17/2019 CLINICAL DATA:  New onset seizure EXAM: CT HEAD WITHOUT CONTRAST TECHNIQUE: Contiguous axial images were obtained from the base of the skull through the vertex without intravenous contrast. COMPARISON:  02/19/2018 FINDINGS: Brain: No acute intracranial abnormality. Specifically, no hemorrhage, hydrocephalus, mass lesion, acute infarction, or significant intracranial injury. Vascular: No hyperdense vessel or unexpected calcification. Skull: No acute calvarial abnormality. Sinuses/Orbits: Visualized paranasal sinuses and mastoids clear. Orbital soft tissues unremarkable. Other: None IMPRESSION: No acute intracranial abnormality. Electronically Signed   By: KRolm BaptiseM.D.   On: 06/17/2019 19:28   MR BRAIN WO CONTRAST  Result Date: 06/17/2019 CLINICAL DATA:  Initial evaluation for acute seizure. EXAM: MRI HEAD WITHOUT CONTRAST TECHNIQUE: Multiplanar, multiecho pulse sequences of the brain and surrounding structures  were obtained without intravenous contrast. COMPARISON:  Prior head CT from earlier the same day. FINDINGS: Brain: Cerebral volume within normal limits for age. Multifocal areas of confluent T2/FLAIR hyperintensity seen involving the cortical and subcortical aspect of the parasagittal frontal lobes as well as the parieto-occipital regions bilaterally. Involvement is fairly symmetric in appearance. Patchy involvement of the left greater than right cerebellar hemispheres present as well. Overall, appearance is most characteristic of PRES. No associated  hemorrhage or mass effect. No evidence for acute infarct. Gray-white matter differentiation maintained with no other areas of remote cortical infarction. No foci of susceptibility artifact to suggest acute or chronic intracranial hemorrhage. No mass lesion, mass effect, or midline shift. Ventricles normal size without hydrocephalus. No extra-axial fluid collection. Pituitary gland suprasellar region normal. Midline structures intact. No intrinsic temporal lobe abnormality. Vascular: Major intracranial vascular flow voids are maintained. Skull and upper cervical spine: Craniocervical junction within normal limits. Moderate multilevel cervical spondylosis noted within the visualized upper cervical spine with associated mild diffuse spinal stenosis. Bone marrow signal intensity within normal limits. No acute scalp soft tissue abnormality. Sinuses/Orbits: Patient status post bilateral ocular lens replacement. Paranasal sinuses are largely clear. No significant mastoid effusion. Inner ear structures grossly normal. Other: None. IMPRESSION: 1. Multifocal confluent T2/FLAIR signal abnormality involving the bilateral cerebral and cerebellar hemispheres as above. Given history of seizure, findings most characteristic for PRES and/or status epilepticus. 2. Otherwise normal brain MRI for age. Electronically Signed   By: Jeannine Boga M.D.   On: 06/17/2019 23:36   Overnight EEG with video  Result Date: 06/18/2019 Lora Havens, MD     06/18/2019 12:07 PM Patient Name: SERENA PETTERSON MRN: [id number] Epilepsy Attending: Lora Havens Referring Physician/Provider: Dr Kerney Elbe Duration: 06/17/2019 2315 to 06/18/2019 1135  Patient history: 79yo F presented with ams and seizure. EEG to evaluate for status epilepticus.  Level of alertness: lethargic  AEDs during EEG study: Ativan, Keppra  Technical aspects: This EEG study was done with scalp electrodes positioned according to the 10-20 International system of  electrode placement. Electrical activity was acquired at a sampling rate of _0  and reviewed with a high frequency filter of _1  and a low frequency filter of _2 . EEG data were recorded continuously and digitally stored.  DESCRIPTION: EEG showed continuous low amplitude generalized 2-3 Hz delta slowing admixed with 13-_3  beta activity. Hyperventilation and photic stimulation were not performed.  ABNORMALITY - Continuous slow, generalized - Beta activity, generalized  IMPRESSION: This study is suggestive of severe diffuse encephalopathy, non specific to etiology but likely secondary to sedation. No seizures or definite epileptiform discharges were seen.  Priyanka Barbra Sarks    Subjective: No acute issues or events overnight, patient's blood pressure is markedly well controlled, more diligent with working with PT today recommending home health as above but otherwise patient denies headache, fevers, chills, nausea, vomiting, diarrhea, constipation, chest pain, seizure-like activity.   Discharge Exam: Vitals:   06/20/19 0458 06/20/19 0815  BP: (!) 130/54 (!) 142/50  Pulse: 73 84  Resp: 17 18  Temp: 98.5 F (36.9 C) 98.7 F (37.1 C)  SpO2: 97% 97%   Vitals:   06/19/19 2043 06/20/19 0024 06/20/19 0458 06/20/19 0815  BP: (!) 138/52 122/68 (!) 130/54 (!) 142/50  Pulse: 77 77 73 84  Resp: _4 Temp: 98.5 F (36.9 C) 98.3 F (36.8 C) 98.5 F (36.9 C) 98.7 F (37.1 C)  TempSrc: Oral Oral Oral Oral  SpO2: 97% 96%  97% 97%  Weight:      Height:        General:  Pleasantly resting in bed, No acute distress. HEENT:  Normocephalic atraumatic.  Sclerae nonicteric, noninjected.  Extraocular movements intact bilaterally. Neck:  Without mass or deformity.  Trachea is midline. Lungs:  Clear to auscultate bilaterally without rhonchi, wheeze, or rales. Heart:  Regular rate and rhythm.  Without murmurs, rubs, or gallops. Abdomen:  Soft, nontender, nondistended.  Without guarding or  rebound. Extremities: Without cyanosis, clubbing, edema, or obvious deformity. Vascular:  Dorsalis pedis and posterior tibial pulses palpable bilaterally. Skin:  Warm and dry, no erythema, no ulcerations.   The results of significant diagnostics from this hospitalization (including imaging, microbiology, ancillary and laboratory) are listed below for reference.     Microbiology: Recent Results (from the past 240 hour(s))  SARS CORONAVIRUS 2 (TAT 6-24 HRS) Nasopharyngeal Nasopharyngeal Swab     Status: None   Collection Time: 06/18/19 12:56 AM   Specimen: Nasopharyngeal Swab  Result Value Ref Range Status   SARS Coronavirus 2 NEGATIVE NEGATIVE Final    Comment: (NOTE) SARS-CoV-2 target nucleic acids are NOT DETECTED. The SARS-CoV-2 RNA is generally detectable in upper and lower respiratory specimens during the acute phase of infection. Negative results do not preclude SARS-CoV-2 infection, do not rule out co-infections with other pathogens, and should not be used as the sole basis for treatment or other patient management decisions. Negative results must be combined with clinical observations, patient history, and epidemiological information. The expected result is Negative. Fact Sheet for Patients: SugarRoll.be Fact Sheet for Healthcare Providers: https://www.woods-mathews.com/ This test is not yet approved or cleared by the [place] FDA and  has been authorized for detection and/or diagnosis of SARS-CoV-2 by FDA under an Emergency Use Authorization (EUA). This EUA will remain  in effect (meaning this test can be used) for the duration of the COVID-19 declaration under Section 56 4(b)(1) of the Act, 21 U.S.C. section 360bbb-3(b)(1), unless the authorization is terminated or revoked sooner. Performed at [facility], [place] [address] [place], [place] [address]      Labs: BNP (last 3 results) No results for input(s): BNP in  the last 8760 hours. Basic Metabolic Panel: Recent Labs  Lab 06/17/19 1825 06/17/19 1834 06/18/19 1158 06/19/19 0409  NA 138 139 141 139  K 4.2 4.2 3.7 3.5  CL 105 104 108 106  CO2 23  --  26 25  GLUCOSE 103* 99 89 98  BUN _0 CREATININE 0.84 0.80 0.97 0.96  CALCIUM 8.7*  --  8.9 9.0   Liver Function Tests: Recent Labs  Lab 06/17/19 1825 06/18/19 1158  AST 31 24  ALT 18 18  ALKPHOS 55 56  BILITOT 0.2* 0.4  PROT 7.0 6.7  ALBUMIN 3.4* 3.1*   No results for input(s): LIPASE, AMYLASE in the last 168 hours. No results for input(s): AMMONIA in the last 168 hours. CBC: Recent Labs  Lab 06/17/19 1825 06/17/19 1834 06/18/19 1158 06/19/19 0409  WBC 6.4  --  5.2 6.0  NEUTROABS 4.3  --   --   --   HGB 10.9* 11.6* 10.4* 10.8*  HCT 36.3 34.0* 34.7* 36.1  MCV 89.9  --  89.2 88.5  PLT 248  --  253 245   Cardiac Enzymes: No results for input(s): CKTOTAL, CKMB, CKMBINDEX, TROPONINI in the last 168 hours. BNP: Invalid input(s): POCBNP CBG: Recent Labs  Lab 06/19/19 0625 06/19/19 1159 06/19/19 1633  06/19/19 2133 06/20/19 0613  GLUCAP 83 82 137* 94 81   D-Dimer No results for input(s): DDIMER in the last 72 hours. Hgb A1c Recent Labs    06/19/19 0409  HGBA1C 6.2*   Lipid Profile No results for input(s): CHOL, HDL, LDLCALC, TRIG, CHOLHDL, LDLDIRECT in the last 72 hours. Thyroid function studies Recent Labs    06/18/19 1158  TSH 1.555   Anemia work up Recent Labs    06/17/19 1858  VITAMINB12 235  FOLATE 14.2   Urinalysis No results found for: COLORURINE, APPEARANCEUR, LABSPEC, Reading, GLUCOSEU, HGBUR, BILIRUBINUR, KETONESUR, PROTEINUR, UROBILINOGEN, NITRITE, LEUKOCYTESUR Sepsis Labs Invalid input(s): PROCALCITONIN,  WBC,  LACTICIDVEN Microbiology Recent Results (from the past 240 hour(s))  SARS CORONAVIRUS 2 (TAT 6-24 HRS) Nasopharyngeal Nasopharyngeal Swab     Status: None   Collection Time: 06/18/19 12:56 AM   Specimen: Nasopharyngeal  Swab  Result Value Ref Range Status   SARS Coronavirus 2 NEGATIVE NEGATIVE Final    Comment: (NOTE) SARS-CoV-2 target nucleic acids are NOT DETECTED. The SARS-CoV-2 RNA is generally detectable in upper and lower respiratory specimens during the acute phase of infection. Negative results do not preclude SARS-CoV-2 infection, do not rule out co-infections with other pathogens, and should not be used as the sole basis for treatment or other patient management decisions. Negative results must be combined with clinical observations, patient history, and epidemiological information. The expected result is Negative. Fact Sheet for Patients: SugarRoll.be Fact Sheet for Healthcare Providers: https://www.woods-mathews.com/ This test is not yet approved or cleared by the [place] FDA and  has been authorized for detection and/or diagnosis of SARS-CoV-2 by FDA under an Emergency Use Authorization (EUA). This EUA will remain  in effect (meaning this test can be used) for the duration of the COVID-19 declaration under Section 56 4(b)(1) of the Act, 21 U.S.C. section 360bbb-3(b)(1), unless the authorization is terminated or revoked sooner. Performed at [facility], [place] [address] [place], [place] [address]      Time coordinating discharge: Over 30 minutes  SIGNED:   Little Ishikawa, DO [facility] 06/20/2019, 10:45 AM Pager   If 7PM-7AM, please contact night-coverage [email or web address]

## 2019-06-22 LAB — VITAMIN B1: Vitamin B1 (Thiamine): 99.6 nmol/L (ref 66.5–200.0)

## 2019-06-25 ENCOUNTER — Telehealth: Payer: Self-pay | Admitting: Pulmonary Disease

## 2019-06-25 NOTE — Telephone Encounter (Signed)
So sorry to hear this, she will need to call PCP to address joint pain .   Please contact office for sooner follow up if symptoms do not improve or worsen or seek emergency care

## 2019-06-25 NOTE — Telephone Encounter (Signed)
Called and spoke with pt letting her know to address her pain in side with PCP when she follows up with them Thursday 4/29. Pt verbalized understanding. Nothing further needed.

## 2019-06-25 NOTE — Telephone Encounter (Signed)
Attempted to call Rosanne Ashing with PT but unable to reach. Left message for him to return call.  Since could not reach [place], called pt herself. Called and spoke with pt who states she is having pain on left side in low back around the hip area. Pt stated the pain started after she had a seizure and then was admitted to the hospital 1 week ago 4/19. Pt was discharged from the hospital 4 days ago and states she is still having the pain.  Pt states that the pain is a throbbing pain. Pt states the pain is worse when she is sitting down and trying to lay down and due to this, she has to lay on her right side.  Asked pt if she has tried any meds to see if it would help with the pain and pt stated that she has not. Pt stated she has been putting a heating pad at the site which she says has helped to relax the pain some.  I stated to pt that she might need to discuss this with her PCP and she stated that she does have a f/u with PCP in 2 days. Pt wanted to check with [place] to see if we could provide any recommendations. Tammy, please advise.

## 2019-07-02 ENCOUNTER — Encounter: Payer: Self-pay | Admitting: Pulmonary Disease

## 2019-07-02 ENCOUNTER — Ambulatory Visit: Payer: Medicare Other | Admitting: Pulmonary Disease

## 2019-07-02 ENCOUNTER — Other Ambulatory Visit: Payer: Self-pay

## 2019-07-02 VITALS — BP 110/68 | HR 82 | Temp 98.4°F | Ht 62.0 in | Wt 154.2 lb

## 2019-07-02 DIAGNOSIS — I2602 Saddle embolus of pulmonary artery with acute cor pulmonale: Secondary | ICD-10-CM | POA: Diagnosis not present

## 2019-07-02 DIAGNOSIS — Z86711 Personal history of pulmonary embolism: Secondary | ICD-10-CM | POA: Diagnosis not present

## 2019-07-02 MED ORDER — APIXABAN 5 MG PO TABS: 5.0000 mg | ORAL_TABLET | Freq: Two times a day (BID) | ORAL | 5 refills | Status: DC

## 2019-07-02 NOTE — Patient Instructions (Signed)
You will need to contact Dr. Dixon Boos office with [facility] to arrange for neurology follow up  Follow up with [facility] in 1 year

## 2019-07-02 NOTE — Progress Notes (Signed)
[facility]  Chief Complaint  Patient presents with  . Follow-up    12yr f/u. States her breathing has been doing well since last visit.     Constitutional:  BP 110/68   Pulse 82   Temp 98.4 F (36.9 C) (Temporal)   Ht 5\' 2"  (1.575 m)   Wt 154 lb 3.2 oz (69.9 kg)   LMP  (LMP Unknown)   SpO2 100% Comment: on RA  BMI 28.20 kg/m   Past Medical History:  DM, HTN, PNA, PRES  Summary:  Glenda Blankenship is a 79 y.o. 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  Subjective:   She was in hospital in April for PRES with seizure in setting of HTN emergency.  No further seizures and BP better controlled.  No issues with eliquis.  Denies chest pain, dyspnea, hemoptysis, leg swelling, bleeding, bruising.   Physical Exam:   Appearance - well kempt   ENMT - no sinus tenderness, no oral exudate, no LAN, Mallampati 2airway, no stridor  Respiratory - equal breath sounds bilaterally, no wheezing or rales  CV - s1s2 regular rate and rhythm, no murmurs  Ext - no clubbing, no edema  Skin - no rashes  Psych - normal mood and affect  CMP Latest Ref Rng & Units 06/19/2019 06/18/2019 06/17/2019  Glucose 70 - 99 mg/dL 98 89 99  BUN 8 - 23 mg/dL 12 10 15   Creatinine 0.44 - 1.00 mg/dL 06/19/2019 3.71  Sodium 135 - 145 mmol/L 139 141 139  Potassium 3.5 - 5.1 mmol/L 3.5 3.7 4.2  Chloride 98 - 111 mmol/L 106 108 104  CO2 22 - 32 mmol/L 25 26 -  Calcium 8.9 - 10.3 mg/dL 9.0 8.9 -  Total Protein 6.5 - 8.1 g/dL - 6.7 -  Total Bilirubin 0.3 - 1.2 mg/dL - 0.4 -  Alkaline Phos 38 - 126 U/L - 56 -  AST 15 - 41 U/L - 24 -  ALT 0 - 44 U/L - 18 -    CBC Latest Ref Rng & Units 06/19/2019 06/18/2019 06/17/2019  WBC 4.0 - 10.5 K/uL 6.0 5.2 -  Hemoglobin 12.0 - 15.0 g/dL 10.8(L) 10.4(L) 11.6(L)  Hematocrit 36.0 - 46.0 % 36.1 34.7(L) 34.0(L)  Platelets 150 - 400 K/uL 245 253 -     Assessment/Plan:   Recurrent DVT and PE. -  continue eliquis indefinitely  PRES with seizure. - will need f/u with Dr. 06/20/2019 with neurology   Follow up:  Patient Instructions  You will need to contact Dr. 06/19/2019 office with [facility] to arrange for neurology follow up  Follow up with [facility] in 1 year   Signature:  Melynda Ripple, MD  Pulmonary/Critical Care Pager: [phone] 07/02/2019, 9:23 AM  Flow Sheet     Pulmonary tests:  CT abd/pelvis 01/08/16 >> small b/l effusions, atherosclerosis Lt thoracentesis 01/11/16 >> glucose 155, protein < 3, LDH 138, WBC 758 (50L, 85M, 11E) Hypercoagulable panel 08/15/16 >> Beta 2 Glycoprotein IgA 34, anticardiolipin IgG 21, anticardiolipin IgM 36  Chest imaging:  CTA Chest 01/12/16>> acute saddle PE with evidence of RV strain (ratio 1:1), small pleural effusions, partially loculated on R, small pericardial effusion CT angio chest 01/26/18 >> acute PE, RV:LV ratio 0.9, mild emphysema  Cardiac tests:  Echo 01/11/16>> EF 65 to 70%, grade 1 DD, mod RV dilation, severe TR, PAS 52 mmHg, mild/mod pericardial effusion B/l LE Duplex  01/13/16 >> DVT Rt popliteal, posterior tibial veins Echo 04/27/16 >> EF 60 to 65%, PAS 35 mmHg Lower extremity doppler 08/25/16 >> no DVT  Medications:   Allergies as of 07/02/2019   No Known Allergies     Medication List       Accurate as of Jul 02, 2019  9:23 AM. If you have any questions, ask your nurse or doctor.        STOP taking these medications   hydrocerin Crea Stopped by: Chesley Mires, MD     TAKE these medications   acetaminophen 325 MG tablet Commonly known as: TYLENOL Take 2 tablets (650 mg total) by mouth every 6 (six) hours as needed for mild pain, fever or headache.   amLODipine 5 MG tablet Commonly known as: NORVASC Take 1 tablet (5 mg total) by mouth daily.   apixaban 5 MG Tabs tablet Commonly known as: Eliquis Take 1 tablet (5 mg total) by mouth 2 (two) times daily. What  changed: See the new instructions. Changed by: Chesley Mires, MD   baclofen 10 MG tablet Commonly known as: LIORESAL Take 10 mg by mouth 3 (three) times daily as needed.   levETIRAcetam 500 MG tablet Commonly known as: KEPPRA Take 1 tablet (500 mg total) by mouth 2 (two) times daily.   lisinopril-hydrochlorothiazide 20-25 MG tablet Commonly known as: ZESTORETIC Take 1 tablet by mouth daily.       Past Surgical History:  She  has a past surgical history that includes kidney stone removal.  Family History:  Her family history is not on file.  Social History:  She  reports that she has quit smoking. Her smoking use included cigarettes. She started smoking about 63 years ago. She smoked 0.50 packs per day. She has never used smokeless tobacco. She reports that she does not drink alcohol or use drugs.

## 2019-07-03 ENCOUNTER — Telehealth: Payer: Self-pay | Admitting: Neurology

## 2019-07-03 NOTE — Telephone Encounter (Signed)
Patient's son called and left message with our EEG technician requesting a call back.  I called and spoke with patient's son.  He wanted to know if patient needs to schedule a follow-up with neurologist.  I recommended following up with neurology in 8 to 12 weeks at which point patient might require a repeat MRI brain to see if changes related to PRES have resolved.  The neurologist can also help determine the duration of Keppra therapy.  Patient's son asked for recommendations for neurologist and was provided names of neurologist in the community.  I also inquired how patient is doing and son reports she is back to her baseline.  All questions were answered.  Patient's son knows how to get hold of our office again if needed.  Skylyn Slezak Annabelle Harman

## 2019-09-18 ENCOUNTER — Telehealth: Payer: Self-pay | Admitting: Pulmonary Disease

## 2019-09-18 DIAGNOSIS — R569 Unspecified convulsions: Secondary | ICD-10-CM

## 2019-09-18 NOTE — Telephone Encounter (Signed)
EC is aware of this and referral has been made nothing further is needed at this time.

## 2019-09-18 NOTE — Telephone Encounter (Signed)
She needs a referral to [facility] ([facility]  If Dr. Melynda Ripple does not see outpatients, then she can be scheduled with any of the other neurologists in [facility].  If she can not get an appointment with [facility], then check with [facility] neurology.

## 2019-09-18 NOTE — Telephone Encounter (Signed)
Called patient's son. He is requesting the referral mentioned on 07/02/2019 for neurology.   Per Dr. Craige Cotta "You will need to contact Dr. Dixon Boos office with [facility] to arrange for neurology follow up. Follow up with [facility] in 1 year"  Dr. Melynda Ripple does EMU, do we order a regular appointment or EMU stay? Please advise.  Triage to write referral upon reply.

## 2019-11-27 ENCOUNTER — Telehealth: Payer: Self-pay | Admitting: Diagnostic Neuroimaging

## 2019-11-27 ENCOUNTER — Ambulatory Visit: Payer: Medicare Other | Admitting: Diagnostic Neuroimaging

## 2019-11-27 ENCOUNTER — Encounter: Payer: Self-pay | Admitting: Diagnostic Neuroimaging

## 2019-11-27 VITALS — BP 193/81 | HR 84 | Ht 62.0 in | Wt 168.0 lb

## 2019-11-27 DIAGNOSIS — I6783 Posterior reversible encephalopathy syndrome: Secondary | ICD-10-CM | POA: Diagnosis not present

## 2019-11-27 NOTE — Progress Notes (Signed)
[facility]  PATIENT: Glenda Blankenship DOB: November 14, 1940  REFERRING CLINICIAN: Carmel Sacramento, NP HISTORY FROM: patient and son REASON FOR VISIT: new consult    HISTORICAL  CHIEF COMPLAINT:  Chief Complaint  Patient presents with  . 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  Patient ran out of blood pressure medications in Glenda 2021.  She then presented with acute onset hypertensive emergency.  Patient was having involuntary movements and spasms in her arms and legs.  She was diagnosed with press and status epilepticus electrographically.  She was treated with blood pressure medications and Keppra.  She was discharged home.  She is done well.  No further seizures.  Blood pressure is elevated today.  She does not check her blood pressure at home because she does not have a machine at home to check.  She completed her Keppra prescription and then discontinued because she did not have refills.  Apparently she checked with PCP who told her to stop.      REVIEW OF SYSTEMS: Full 14 system review of systems performed and negative with exception of: As per HPI.  ALLERGIES: No Known Allergies  HOME MEDICATIONS: Outpatient Medications Prior to Visit  Medication Sig Dispense Refill  . acetaminophen (TYLENOL) 325 MG tablet Take 2 tablets (650 mg total) by mouth every 6 (six) hours as needed for mild pain, fever or headache.    Marland Kitchen amLODipine (NORVASC) 5 MG tablet Take 1 tablet (5 mg total) by mouth daily. 30 tablet 1  . apixaban (ELIQUIS) 5 MG TABS tablet Take 1 tablet (5 mg total) by mouth 2 (two) times daily. 60 tablet 5  . baclofen (LIORESAL) 10 MG tablet Take 10 mg by mouth 3 (three) times daily as  needed.    . levETIRAcetam (KEPPRA) 500 MG tablet Take 1 tablet (500 mg total) by mouth 2 (two) times daily. 60 tablet 1  . lisinopril-hydrochlorothiazide (ZESTORETIC) 20-25 MG tablet Take 1 tablet by mouth daily. 30 tablet 1   No facility-administered medications prior to visit.    PAST MEDICAL HISTORY: Past Medical History:  Diagnosis Date  . Diabetes [facility])    patient denies  . DVT (deep vein thrombosis) in pregnancy   . Hypertension   . Pneumonia   . Renal disorder   . Seizures (HCC) 05/2019    PAST SURGICAL HISTORY: Past Surgical History:  Procedure Laterality Date  . kidney stone removal      FAMILY HISTORY: No family history on file.  SOCIAL HISTORY: Social History   Socioeconomic History  . Marital status: Divorced    Spouse name: Not on file  . Number of children: 2  . Years of education: 105  . Highest education level: Not on file  Occupational History  . Not on file  Tobacco Use  . Smoking status: Former Smoker    Packs/day: 0.50    Types: Cigarettes    Start date: 07/09/1956  . Smokeless tobacco: Never Used  . Tobacco comment: quit "so long ago" not give me a date  Vaping Use  . Vaping Use: Never used  Substance and Sexual Activity  . Alcohol use: No  . Drug use: No  . Sexual activity: Not on file  Other  Topics Concern  . Not on file  Social History Narrative   11/27/19 lives alone   Caffeine- coffee in winter only   Social Determinants of Health   Financial Resource Strain:   . Difficulty of Paying Living Expenses: Not on file  Food Insecurity:   . Worried About Programme researcher, broadcasting/film/video in the Last Year: Not on file  . Ran Out of Food in the Last Year: Not on file  Transportation Needs:   . Lack of Transportation (Medical): Not on file  . Lack of Transportation (Non-Medical): Not on file  Physical Activity:   . Days of Exercise per Week: Not on file  . Minutes of Exercise per Session: Not on file  Stress:   . Feeling of Stress : Not on file   Social Connections:   . Frequency of Communication with Friends and Family: Not on file  . Frequency of Social Gatherings with Friends and Family: Not on file  . Attends Religious Services: Not on file  . Active Member of Clubs or Organizations: Not on file  . Attends Banker Meetings: Not on file  . Marital Status: Not on file  Intimate Partner Violence:   . Fear of Current or Ex-Partner: Not on file  . Emotionally Abused: Not on file  . Physically Abused: Not on file  . Sexually Abused: Not on file     PHYSICAL EXAM  GENERAL EXAM/CONSTITUTIONAL: Vitals:  Vitals:   11/27/19 1024 11/27/19 1100  BP: (!) 190/84 (!) 193/81  Pulse: 87 84  Weight: 168 lb (76.2 kg)   Height: 5\' 2"  (1.575 m)     Body mass index is 30.73 kg/m. Wt Readings from Last 3 Encounters:  11/27/19 168 lb (76.2 kg)  07/02/19 154 lb 3.2 oz (69.9 kg)  06/18/19 161 lb 9.6 oz (73.3 kg)     Patient is in no distress; well developed, nourished and groomed; neck is supple  CARDIOVASCULAR:  Examination of carotid arteries is normal; no carotid bruits  Regular rate and rhythm, no murmurs  Examination of peripheral vascular system by observation and palpation is normal  EYES:  Ophthalmoscopic exam of optic discs and posterior segments is normal; no papilledema or hemorrhages  No exam data present  MUSCULOSKELETAL:  Gait, strength, tone, movements noted in Neurologic exam below  NEUROLOGIC: MENTAL STATUS:  No flowsheet data found.  awake, alert, oriented to person, place and time  recent and remote memory intact  normal attention and concentration  language fluent, comprehension intact, naming intact  fund of knowledge appropriate  CRANIAL NERVE:   2nd - no papilledema on fundoscopic exam  2nd, 3rd, 4th, 6th - pupils equal and reactive to light, visual fields full to confrontation, extraocular muscles intact, no nystagmus  5th - facial sensation symmetric  7th - facial  strength symmetric  8th - hearing intact  9th - palate elevates symmetrically, uvula midline  11th - shoulder shrug symmetric  12th - tongue protrusion midline  MOTOR:   normal bulk and tone, full strength in the BUE, BLE  SENSORY:   normal and symmetric to light touch, temperature, vibration  COORDINATION:   finger-nose-finger, fine finger movements normal  REFLEXES:   deep tendon reflexes present and symmetric  GAIT/STATION:   narrow based gait     DIAGNOSTIC DATA (LABS, IMAGING, TESTING) - I reviewed patient records, labs, notes, testing and imaging myself where available.  Lab Results  Component Value Date   WBC 6.0 06/19/2019   HGB  10.8 (L) 06/19/2019   HCT 36.1 06/19/2019   MCV 88.5 06/19/2019   PLT 245 06/19/2019      Component Value Date/Time   NA 139 06/19/2019 0409   K 3.5 06/19/2019 0409   CL 106 06/19/2019 0409   CO2 25 06/19/2019 0409   GLUCOSE 98 06/19/2019 0409   BUN 12 06/19/2019 0409   CREATININE 0.96 06/19/2019 0409   CALCIUM 9.0 06/19/2019 0409   PROT 6.7 06/18/2019 1158   ALBUMIN 3.1 (L) 06/18/2019 1158   AST 24 06/18/2019 1158   ALT 18 06/18/2019 1158   ALKPHOS 56 06/18/2019 1158   BILITOT 0.4 06/18/2019 1158   GFRNONAA 56 (L) 06/19/2019 0409   GFRAA >60 06/19/2019 0409   No results found for: CHOL, HDL, LDLCALC, LDLDIRECT, TRIG, CHOLHDL Lab Results  Component Value Date   HGBA1C 6.2 (H) 06/19/2019   Lab Results  Component Value Date   VITAMINB12 235 06/17/2019   Lab Results  Component Value Date   TSH 1.555 06/18/2019    06/17/19 MRI brain [I reviewed images myself and agree with interpretation. -[facility]]  1. Multifocal confluent T2/FLAIR signal abnormality involving the bilateral cerebral and cerebellar hemispheres as above. Given history of seizure, findings most characteristic for PRES and/or status epilepticus. 2. Otherwise normal brain MRI for age.   ASSESSMENT AND PLAN  79 y.o. year old female here  with:  Dx:  1. PRES (posterior reversible encephalopathy syndrome)      PLAN:  PRES / HYPERTENSIVE EMERGENCY / SEIZURE (06/17/19) - repeat MRI brain  - check BP at home (>190/80 today)  - continue BP meds  - stay off levetiracetam (stopped by patient after refills ran out in June 2021)  - According to [facility] law, you can not drive unless you are seizure / syncope free for at least 6 months and under physician's care.   - Please maintain precautions. Do not participate in activities where a loss of awareness could harm you or someone else. No swimming alone, no tub bathing, no hot tubs, no driving, no operating motorized vehicles (cars, ATVs, motocycles, etc), lawnmowers, power tools or firearms. No standing at heights, such as rooftops, ladders or stairs. Avoid hot objects such as stoves, heaters, open fires. Wear a helmet when riding a bicycle, scooter, skateboard, etc. and avoid areas of traffic. Set your water heater to 120 degrees or less.   Orders Placed This Encounter  Procedures  . MR BRAIN WO CONTRAST    Return for pending if symptoms worsen or fail to improve, return to PCP.    Suanne Marker, MD 11/27/2019, 10:58 AM Certified in Neurology, Neurophysiology and Neuroimaging  [facility] [address], [address] [place], [place] [address] [phone]

## 2019-11-27 NOTE — Telephone Encounter (Signed)
[employer] medicare order sent to [facility]. No auth they will reach out to the patient to schedule.  

## 2019-11-27 NOTE — Patient Instructions (Signed)
PRES / HYPERTENSIVE EMERGENCY / SEIZURE (06/17/19)  - repeat MRI brain  - check BP at home  - continue BP meds  - stay off levetiracetam (stopped by patient after refills ran out in June 2021)  - According to [facility] law, you can not drive unless you are seizure / syncope free for at least 6 months and under physician's care.   - Please maintain precautions. Do not participate in activities where a loss of awareness could harm you or someone else. No swimming alone, no tub bathing, no hot tubs, no driving, no operating motorized vehicles (cars, ATVs, motocycles, etc), lawnmowers, power tools or firearms. No standing at heights, such as rooftops, ladders or stairs. Avoid hot objects such as stoves, heaters, open fires. Wear a helmet when riding a bicycle, scooter, skateboard, etc. and avoid areas of traffic. Set your water heater to 120 degrees or less.

## 2019-12-06 ENCOUNTER — Ambulatory Visit
Admission: RE | Admit: 2019-12-06 | Discharge: 2019-12-06 | Disposition: A | Discharge status: Home / Self Care | Payer: Medicare Other | Source: Ambulatory Visit | Attending: Diagnostic Neuroimaging | Admitting: Diagnostic Neuroimaging

## 2019-12-06 DIAGNOSIS — I6783 Posterior reversible encephalopathy syndrome: Secondary | ICD-10-CM

## 2019-12-10 ENCOUNTER — Telehealth: Payer: Self-pay | Admitting: *Deleted

## 2019-12-10 NOTE — Telephone Encounter (Signed)
LVM requesting call back for MRI results. 

## 2019-12-11 NOTE — Telephone Encounter (Addendum)
Patient's son, on DPR returned call; I informed him her MRI brain showed unremarkable brain imaging results. The previous brain changes have improved. 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 He asked if there was anything to do about arthritis; I advised monitor for any symptoms related to her neck and let PCP know. He asked if she can drive now, seizure was on 06/17/19. I advised on 11/1919 if seizure free which he acknowledged she has been. He verbalized understanding, appreciation.

## 2020-07-13 IMAGING — CT CT MAXILLOFACIAL W/ CM
3 of 6 series · 14 of 47 positions shown, 16 images · IV contrast (omnipaque)
Comparison: None.

CLINICAL DATA: 77-year-old female with left jaw swelling upon
waking today. Fever of 101. Left side dental disease.

EXAM:
CT MAXILLOFACIAL WITH CONTRAST
TECHNIQUE: Multidetector CT imaging of the maxillofacial structures was
performed with intravenous contrast. Multiplanar CT image
reconstructions were also generated.
CONTRAST:  75mL OMNIPAQUE IOHEXOL 300 MG/ML  SOLN

[Series 5: coronal soft · coronal · 0.33mm/px · 3 of 60 slices shown]
[im 20/60  bone]
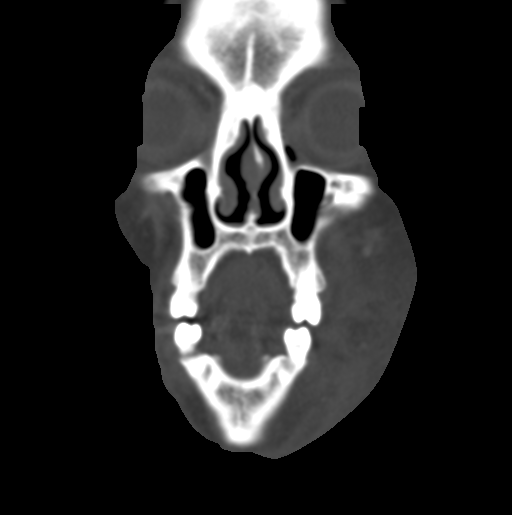
[im 27/60  bone]
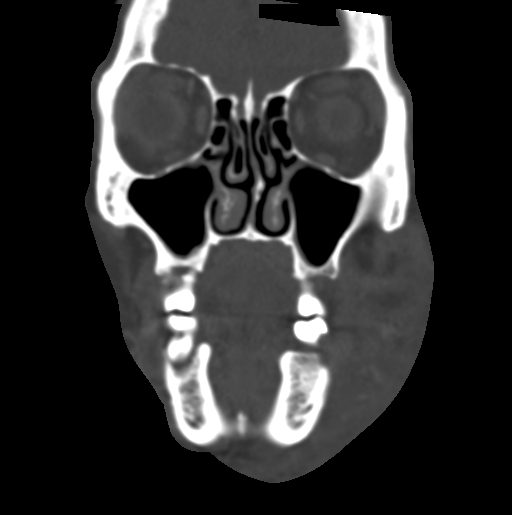
[im 33/60  bone]
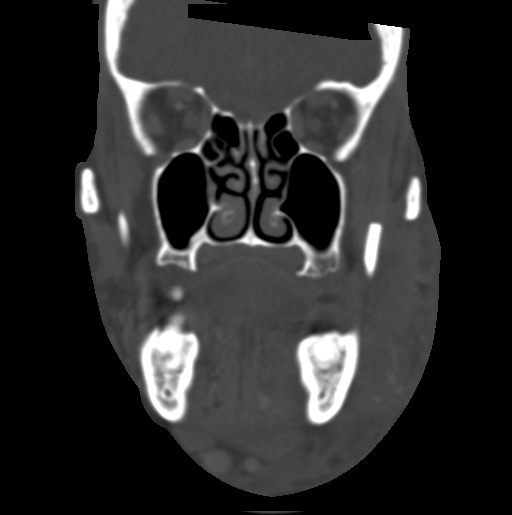

[Series 8: sagittal bone · sagittal · 0.29mm/px · 3 of 70 slices shown]
[im 28/70  bone]
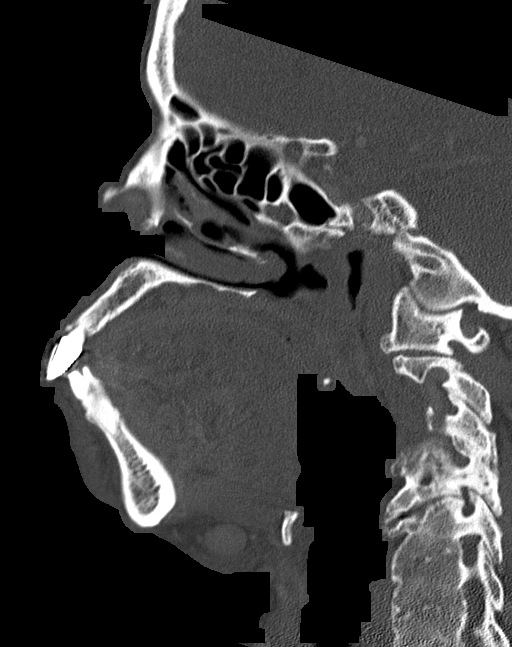
[im 42/70  bone]
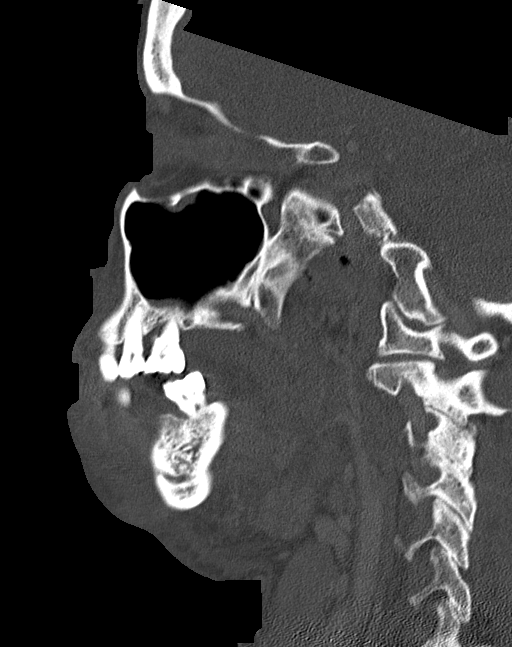
[im 56/70  bone]
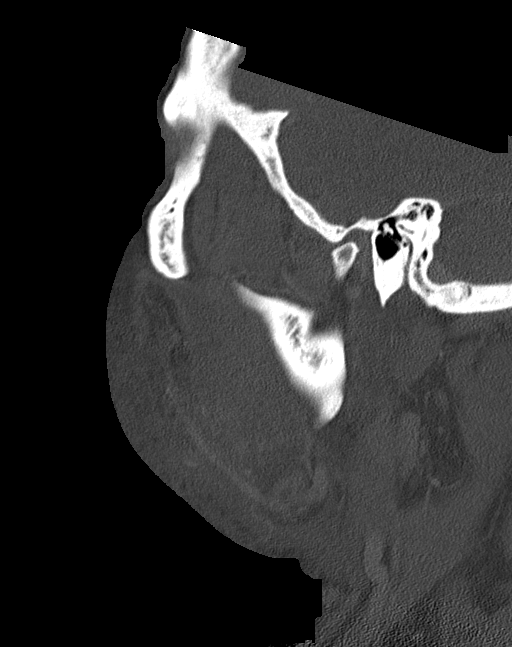

[Series 12: ax max bone recon · axial · 0.31mm/px · z∈[+1322,+1454]mm · 8 of 87 slices shown, 10 images]
[im 10/87  brain]
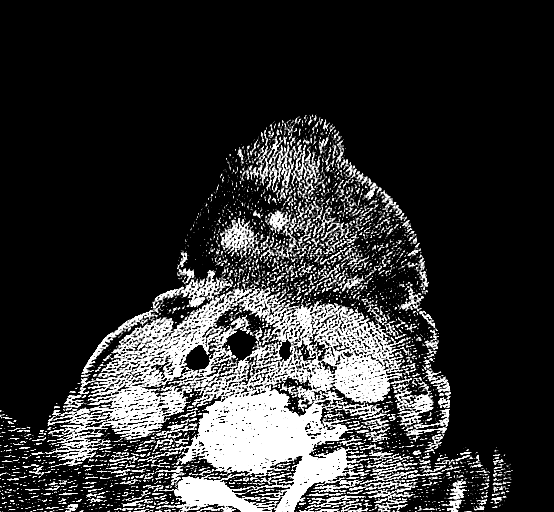
[im 10/87  bone]
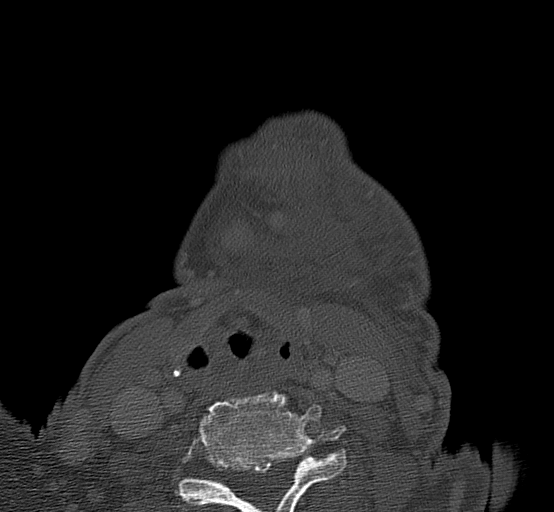
[im 20/87  bone]
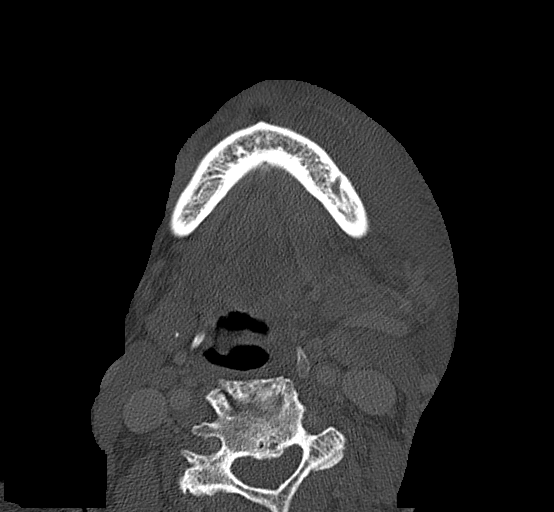
[im 29/87  bone]
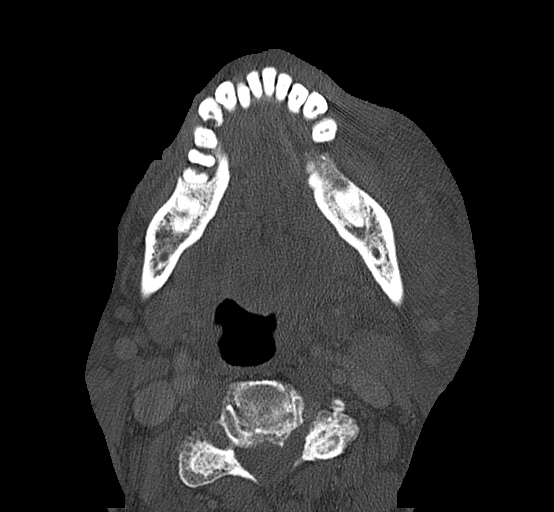
[im 39/87  bone]
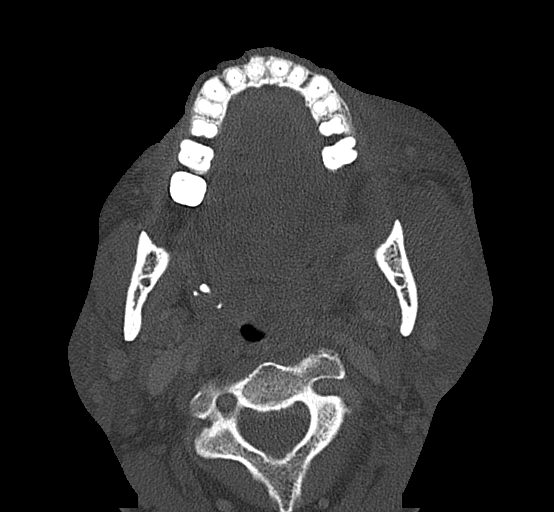
[im 48/87  brain]
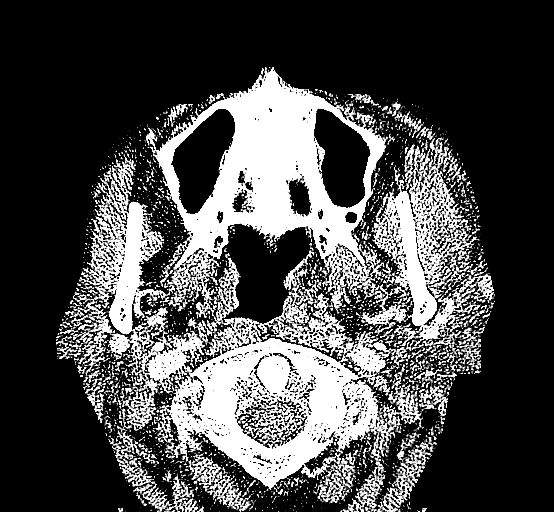
[im 48/87  bone]
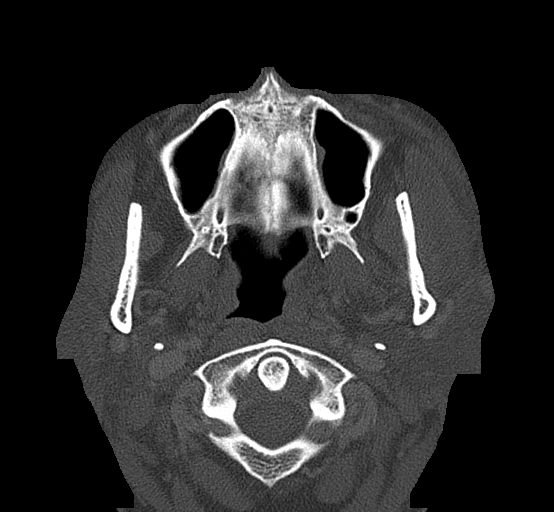
[im 58/87  bone]
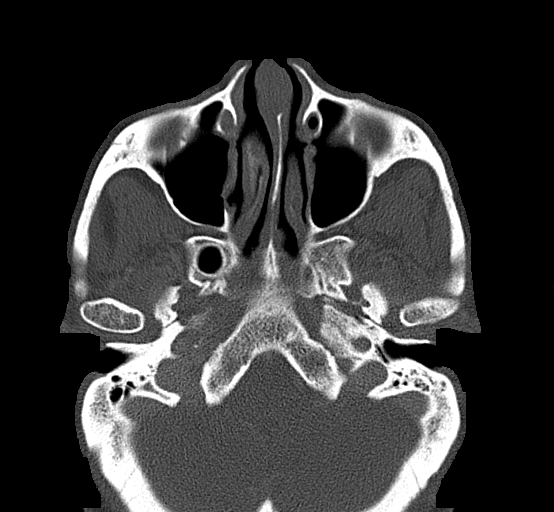
[im 67/87  bone]
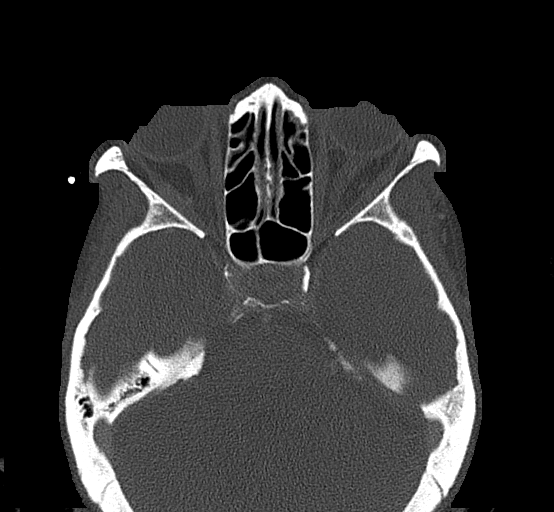
[im 77/87  bone]
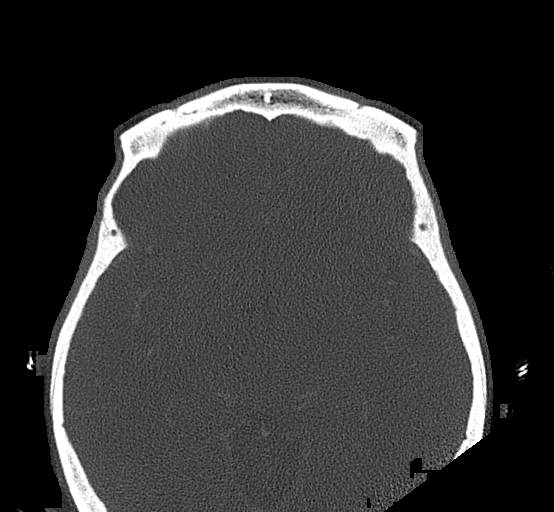

[14 of 47 positions shown; findings below may reference images not displayed]

FINDINGS: Osseous: Multifocal left side dental caries, involving the mandible
and maxillary molars, bicuspids. Cortical breakthrough from the
residual left maxillary molar on series 12, image 46, although
similar contralateral right maxillary anterior molar cortical
breakthrough.

No superimposed fracture of the mandible or maxilla. Zygoma and
nasal bones intact. Central skull base intact. Severe cervical spine
degeneration with multilevel postoperative versus acquired
ankylosis.

Orbits: Intact orbital walls. Symmetric and negative orbits soft
tissues.

Sinuses: Well pneumatized with only trace bilateral paranasal sinus
mucosal thickening. Tympanic cavities are clear. Mastoids are
hypoplastic but clear.

Soft tissues: Severe left facial soft tissue swelling and
inflammatory stranding with epicenter at the left mandible alveolus.
Confluent subperiosteal edema demonstrated on series 11, image 66,
but no regional abscess. Secondary inflammation in the left
parapharyngeal space at the level of the oropharynx. The
retropharyngeal space and right parapharyngeal space remain normal.
Secondary inflammation in the left submandibular space with
thickening of the platysma. The submandibular glands appear
symmetric with punctate sialolithiasis.

However, the parotid glands are asymmetric and the left parotid is
abnormal with bulky cystic change within the gland or severe
intraglandular ductal ectasia best seen on sagittal series 6, image
64. However, there is no dilatation of the left parotid duct. No
hyperenhancement of the residual left parotid parenchyma.

Reactive level 1 lymph nodes greater on the left. Reactive left
level 2A lymph nodes. These nodes are hyperenhancing and mildly
enlarged but no cystic or necrotic nodes are identified. The major
vascular structures in the neck and at the skull base are patent
including the left IJ.

There is bulky dystrophic appearing calcification of the right
palatine tonsil.

Limited intracranial: Negative visualized brain parenchyma.
Cavernous sinus appears patent.
IMPRESSION: 1. Odontogenic left facial cellulitis suspected, with multifocal
bilateral carious dentition but discrete dental source identified.
Reactive level 1 and left level 2 lymphadenopathy.
2. Likewise, confluent soft tissue edema but no abscess or drainable
fluid collection. Secondary inflammation in the left parapharyngeal
and submandibular spaces.
3. Abnormal left parotid gland is favored to be unrelated to the
above.
There is severe intraglandular ductal ectasia or less likely a
cystic mass within the gland. Recommend follow-up Neck CT with IV
contrast following resolution of the above acute process.

## 2020-11-27 ENCOUNTER — Telehealth: Payer: Self-pay | Admitting: Adult Health

## 2020-11-27 ENCOUNTER — Ambulatory Visit: Payer: Medicare Other | Admitting: Adult Health

## 2020-11-27 ENCOUNTER — Other Ambulatory Visit: Payer: Self-pay

## 2020-11-27 ENCOUNTER — Other Ambulatory Visit (HOSPITAL_COMMUNITY): Payer: Self-pay

## 2020-11-27 ENCOUNTER — Encounter: Payer: Self-pay | Admitting: Adult Health

## 2020-11-27 VITALS — BP 140/70 | HR 71 | Temp 98.4°F | Ht 62.0 in | Wt 161.6 lb

## 2020-11-27 DIAGNOSIS — I2699 Other pulmonary embolism without acute cor pulmonale: Secondary | ICD-10-CM | POA: Diagnosis not present

## 2020-11-27 DIAGNOSIS — I2602 Saddle embolus of pulmonary artery with acute cor pulmonale: Secondary | ICD-10-CM

## 2020-11-27 DIAGNOSIS — Z23 Encounter for immunization: Secondary | ICD-10-CM | POA: Diagnosis not present

## 2020-11-27 MED ORDER — APIXABAN 5 MG PO TABS: 5.0000 mg | ORAL_TABLET | Freq: Two times a day (BID) | ORAL | 0 refills | Status: AC

## 2020-11-27 NOTE — Telephone Encounter (Signed)
Call made to patient, her son picked up and asked that I contact the patient at a different number. Number called. No answer and no voicemail.   Letter printed with directions regarding the patient assistance application and what further documentation is needed. Envelope placed upfront to be mailed out.   Nothing further needed at this time.

## 2020-11-27 NOTE — Addendum Note (Signed)
Addended by: Katrinka Blazing R on: 11/27/2020 03:01 PM   Modules accepted: Orders

## 2020-11-27 NOTE — Assessment & Plan Note (Signed)
Patient with a history of recurrent unprovoked PE and DVT in 2017 and again in 2019 off of anticoagulation therapy.  We went over the potential risks  for recurrent PE and DVT.  We went over options for more affordable treatment plan.  Talked about Coumadin however patient does not want to go this route at this time.  We will fill out paperwork for patient assistance for Eliquis.  For now patient says that she will go ahead and pay the monthly co-pay for Eliquis until the end of this year. She will restart Eliquis 5 mg daily.  Patient education on anticoagulation therapy.  Avoidance of nonsteroidals.  And medication compliance.  Plan  Patient Instructions  Restart Eliquis 5mg  Twice daily   No NSAIDS -advil, ibuprofen, motrin , etc  Activity as tolerated.  Patient assistance paperwork for Eliquis .  Flu shot today .  Follow up with Dr.  in 4-6 months and As needed

## 2020-11-27 NOTE — Telephone Encounter (Signed)
Patient could apply for [employer] patient assistance program for Eliquis.  She should submit income documentation.  She will need to complete her portion of application and also request a printout of how much she has spent out-of-pocket in 2022 for her prescriptions. This printout can be obtained from her local pharmacy.  Link for application: [email or web address]  Chesley Mires, PharmD, MPH, BCPS Clinical Pharmacist (Rheumatology and Pulmonology)

## 2020-11-27 NOTE — Progress Notes (Signed)
Reviewed and agree with assessment/plan.   Coralyn Helling, MD [facility] 11/27/2020, 2:39 PM Pager:  [phone]

## 2020-11-27 NOTE — Telephone Encounter (Signed)
Patient came in today for OV and stated that she is on Eliquis but it has gotten to expensive and she can't afford it. She states that she has now been without it for 3 weeks. At OV she was advised to go to pharmacy to pick up 30 day supply and start taking it again and we would either see about patient assistance or switching to something cheaper. Patient does not want to switch to coumadin at this time.    Please advise

## 2020-11-27 NOTE — Patient Instructions (Signed)
Restart Eliquis 5mg  Twice daily   No NSAIDS -advil, ibuprofen, motrin , etc  Activity as tolerated.  Patient assistance paperwork for Eliquis .  Flu shot today .  Follow up with Dr.  in 4-6 months and As needed

## 2020-11-27 NOTE — Progress Notes (Signed)
@Patient  ID: , female    DOB: September 18, 1940, 80 y.o.   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  TEST/EVENTS :  CT abd/pelvis 01/08/16 >> small b/l effusions, atherosclerosis Lt thoracentesis 01/11/16 >> glucose 155, protein < 3, LDH 138, WBC 758 (50L, 85M, 11E) Hypercoagulable panel 08/15/16 >> Beta 2 Glycoprotein IgA 34, anticardiolipin IgG 21, anticardiolipin IgM 36  CTA Chest 01/12/16>> acute saddle PE with evidence of RV strain (ratio 1:1), small pleural effusions, partially loculated on R, small pericardial effusion CT angio chest 01/26/18 >> acute PE, RV:LV ratio 0.9, mild emphysema   Cardiac tests:  Echo 01/11/16>> EF 65 to 70%, grade 1 DD, mod RV dilation, severe TR, PAS 52 mmHg, mild/mod pericardial effusion B/l LE Duplex 01/13/16 >> DVT Rt popliteal, posterior tibial veins Echo 04/27/16 >> EF 60 to 65%, PAS 35 mmHg Lower extremity doppler 08/25/16 >> no DVT  11/27/2020 Follow up : Recurrent PE/DVT  Patient returns for a follow-up visit.  Last seen Jul 02, 2019.  Patient has a history of recurrent DVT and PE .  She had an unprovoked DVT in the right leg and PE in November 2017 .  She was treated with Eliquis.  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  She was recommended to remain on lifelong anticoagulation.  She has been on Eliquis.  Unfortunately patient says that she ran out of her prescription about 2 to 3 weeks ago.  She said her prescription has gone up the last 2 times that she has gone to the pharmacy.  She has been paying around $47 each month.  She says it is hard for her to afford this.  We discussed alternatives such as Coumadin.  She would prefer  not to begin Coumadin as it is not as convenient and she would have to go for lab checks.  We discussed patient assistance.  She says she has been using a coupon this past year which is help with some of the cost. She denies any chest pain, shortness of breath, calf pain, calf swelling.   No Known Allergies  Immunization History  Administered Date(s) Administered   Fluad Quad(high Dose 65+) 11/27/2020   Influenza, High Dose Seasonal PF 03/12/2018   Influenza-Unspecified 12/29/2012, 12/07/2015   Pneumococcal Conjugate-13 03/08/2018    Past Medical History:  Diagnosis Date   Diabetes (HCC)    patient denies   DVT (deep vein thrombosis) in pregnancy    Hypertension    Pneumonia    Renal disorder    Seizures (HCC) 05/2019    Tobacco History: Social History   Tobacco Use  Smoking Status Former   Packs/day: 0.50   Types: Cigarettes   Start date: 07/09/1956  Smokeless Tobacco Never  Tobacco Comments   quit "so long ago" not give me a date   Counseling given: Not Answered Tobacco comments: quit "so long ago" not give me a date   Outpatient Medications Prior to Visit  Medication Sig Dispense Refill   acetaminophen (TYLENOL) 325 MG tablet Take 2 tablets (650 mg total) by mouth every 6 (six) hours as needed for mild pain, fever or headache.  amLODipine (NORVASC) 5 MG tablet Take 1 tablet (5 mg total) by mouth daily. 30 tablet 1   apixaban (ELIQUIS) 5 MG TABS tablet Take 1 tablet (5 mg total) by mouth 2 (two) times daily. 60 tablet 5   baclofen (LIORESAL) 10 MG tablet Take 10 mg by mouth 3 (three) times daily as needed.     levETIRAcetam (KEPPRA) 500 MG tablet Take 1 tablet (500 mg total) by mouth 2 (two) times daily. 60 tablet 1   lisinopril-hydrochlorothiazide (ZESTORETIC) 20-25 MG tablet Take 1 tablet by mouth daily. 30 tablet 1   No facility-administered medications prior to visit.     Review of Systems:   Constitutional:   No  weight loss, night sweats,  Fevers,  chills, fatigue, or  lassitude.  HEENT:   No headaches,  Difficulty swallowing,  Tooth/dental problems, or  Sore throat,                No sneezing, itching, ear ache, nasal congestion, post nasal drip,   CV:  No chest pain,  Orthopnea, PND, swelling in lower extremities, anasarca, dizziness, palpitations, syncope.   GI  No heartburn, indigestion, abdominal pain, nausea, vomiting, diarrhea, change in bowel habits, loss of appetite, bloody stools.   Resp: No shortness of breath with exertion or at rest.  No excess mucus, no productive cough,  No non-productive cough,  No coughing up of blood.  No change in color of mucus.  No wheezing.  No chest wall deformity  Skin: no rash or lesions.  GU: no dysuria, change in color of urine, no urgency or frequency.  No flank pain, no hematuria   MS:  No joint pain or swelling.  No decreased range of motion.  No back pain.    Physical Exam  BP 140/70 (BP Location: Left Arm, Patient Position: Sitting, Cuff Size: Normal)   Pulse 71   Temp 98.4 F (36.9 C) (Oral)   Ht 5\' 2"  (1.575 m)   Wt 161 lb 9.6 oz (73.3 kg)   LMP  (LMP Unknown)   SpO2 98%   BMI 29.56 kg/m   GEN: A/Ox3; pleasant , NAD, elderly    HEENT:  [place]/AT,  EACs-clear, TMs-wnl, NOSE-clear, THROAT-clear, no lesions, no postnasal drip or exudate noted.   NECK:  Supple w/ fair ROM; no JVD; normal carotid impulses w/o bruits; no thyromegaly or nodules palpated; no lymphadenopathy.    RESP  Clear  P & A; w/o, wheezes/ rales/ or rhonchi. no accessory muscle use, no dullness to percussion  CARD:  RRR, no m/r/g, no peripheral edema, pulses intact, no cyanosis or clubbing.  GI:   Soft & nt; nml bowel sounds; no organomegaly or masses detected.   Musco: Warm bil, no deformities or joint swelling noted.   Neuro: alert, no focal deficits noted.    Skin: Warm, no lesions or rashes    Lab Results:  CBC    BNP No results found for: BNP  ProBNP No results found for:  PROBNP  Imaging: No results found.    No flowsheet data found.  No results found for: NITRICOXIDE      Assessment & Plan:   Pulmonary embolism [facility]) Patient with a history of recurrent unprovoked PE and DVT in 2017 and again in 2019 off of anticoagulation therapy.  We went over the potential risks  for recurrent PE and DVT.  We went over options for more affordable treatment plan.  Talked about Coumadin however patient does not want to go  this route at this time.  We will fill out paperwork for patient assistance for Eliquis.  For now patient says that she will go ahead and pay the monthly co-pay for Eliquis until the end of this year. She will restart Eliquis 5 mg daily.  Patient education on anticoagulation therapy.  Avoidance of nonsteroidals.  And medication compliance.  Plan  Patient Instructions  Restart Eliquis 5mg  Twice daily   No NSAIDS -advil, ibuprofen, motrin , etc  Activity as tolerated.  Patient assistance paperwork for Eliquis .  Flu shot today .  Follow up with Dr.  in 4-6 months and As needed          Craige Cotta, NP 11/27/2020

## 2021-06-10 ENCOUNTER — Emergency Department (HOSPITAL_BASED_OUTPATIENT_CLINIC_OR_DEPARTMENT_OTHER)
Admission: EM | Admit: 2021-06-10 | Discharge: 2021-06-10 | Disposition: A | Discharge status: Home / Self Care | Payer: Medicare Other | Attending: Emergency Medicine | Admitting: Emergency Medicine

## 2021-06-10 ENCOUNTER — Emergency Department (HOSPITAL_BASED_OUTPATIENT_CLINIC_OR_DEPARTMENT_OTHER): Payer: Medicare Other

## 2021-06-10 ENCOUNTER — Encounter (HOSPITAL_BASED_OUTPATIENT_CLINIC_OR_DEPARTMENT_OTHER): Payer: Self-pay | Admitting: Emergency Medicine

## 2021-06-10 ENCOUNTER — Other Ambulatory Visit: Payer: Self-pay

## 2021-06-10 DIAGNOSIS — M7989 Other specified soft tissue disorders: Secondary | ICD-10-CM | POA: Insufficient documentation

## 2021-06-10 DIAGNOSIS — Z7901 Long term (current) use of anticoagulants: Secondary | ICD-10-CM | POA: Diagnosis not present

## 2021-06-10 LAB — COMPREHENSIVE METABOLIC PANEL
ALT: 9 U/L (ref 0–44)
AST: 16 U/L (ref 15–41)
Albumin: 4.5 g/dL (ref 3.5–5.0)
Alkaline Phosphatase: 49 U/L (ref 38–126)
Anion gap: 11 (ref 5–15)
BUN: 21 mg/dL (ref 8–23)
CO2: 28 mmol/L (ref 22–32)
Calcium: 10.5 mg/dL — ABNORMAL HIGH (ref 8.9–10.3)
Chloride: 99 mmol/L (ref 98–111)
Creatinine, Ser: 1.16 mg/dL — ABNORMAL HIGH (ref 0.44–1.00)
GFR, Estimated: 47 mL/min — ABNORMAL LOW (ref >60)
Glucose, Bld: 109 mg/dL — ABNORMAL HIGH (ref 70–99)
Potassium: 4.2 mmol/L (ref 3.5–5.1)
Sodium: 138 mmol/L (ref 135–145)
Total Bilirubin: 0.6 mg/dL (ref 0.3–1.2)
Total Protein: 8.8 g/dL — ABNORMAL HIGH (ref 6.5–8.1)

## 2021-06-10 LAB — CBC WITH DIFFERENTIAL/PLATELET
Abs Immature Granulocytes: 0.03 10*3/uL (ref 0.00–0.07)
Basophils Absolute: 0 10*3/uL (ref 0.0–0.1)
Basophils Relative: 0 %
Eosinophils Absolute: 0.1 10*3/uL (ref 0.0–0.5)
Eosinophils Relative: 1 %
HCT: 39.1 % (ref 36.0–46.0)
Hemoglobin: 11.9 g/dL — ABNORMAL LOW (ref 12.0–15.0)
Immature Granulocytes: 0 %
Lymphocytes Relative: 19 %
Lymphs Abs: 1.3 10*3/uL (ref 0.7–4.0)
MCH: 27.1 pg (ref 26.0–34.0)
MCHC: 30.4 g/dL (ref 30.0–36.0)
MCV: 89.1 fL (ref 80.0–100.0)
Monocytes Absolute: 0.7 10*3/uL (ref 0.1–1.0)
Monocytes Relative: 10 %
Neutro Abs: 4.8 10*3/uL (ref 1.7–7.7)
Neutrophils Relative %: 70 %
Platelets: 271 10*3/uL (ref 150–400)
RBC: 4.39 MIL/uL (ref 3.87–5.11)
RDW: 13.2 % (ref 11.5–15.5)
WBC: 6.9 10*3/uL (ref 4.0–10.5)
nRBC: 0 % (ref 0.0–0.2)

## 2021-06-10 MED ORDER — FUROSEMIDE 10 MG/ML IJ SOLN: 40.0000 mg | Freq: Once | INTRAMUSCULAR | Status: DC

## 2021-06-10 MED ORDER — FUROSEMIDE 20 MG PO TABS: 20.0000 mg | ORAL_TABLET | Freq: Every day | ORAL | 0 refills | Status: DC

## 2021-06-10 MED ORDER — FUROSEMIDE 40 MG PO TABS
40.0000 mg | ORAL_TABLET | Freq: Once | ORAL | Status: AC
  Administered 2021-06-10: 40 mg via ORAL
  Filled 2021-06-10: qty 1

## 2021-06-10 NOTE — Discharge Instructions (Signed)
You do not have any blood clots right now ? ?Please continue taking your Eliquis ? ?You can take Lasix 20 mg daily for 3 days ? ?Please keep your leg elevated and try compression stockings ? ?See your doctor for follow up ? ?Return to ER if you have worse leg swelling, trouble breathing ?

## 2021-06-10 NOTE — ED Triage Notes (Signed)
Pt c/o bilateral foot pain that started Monday; +3 pitting edema on ht left foot and +1 pitting edema on the right foot. Pt states she has hard time standing of same.  Pt reports numbness and tingling and pain only when standing.  ?

## 2021-06-10 NOTE — ED Provider Notes (Signed)
?[facility]ER[facility] DEPT ?Provider Note ? ? ?CSN: [id number] ?Arrival date & time: 06/10/21  1704 ? ?  ? ?History ? ?Chief Complaint  ?Patient presents with  ? Foot Pain  ? ? ?Glenda Blankenship is a 81 y.o. female history of PE on Eliquis, here presenting bilateral leg swelling.  Patient states that several days ago she noticed left foot swelling and now it progressed to the right.  Denies any chest pain or shortness of breath.  Patient states that she has been pliant with her Eliquis.  Denies any trouble urinating. ? ?The history is provided by the patient.  ? ?  ? ?Home Medications ?Prior to Admission medications   ?Medication Sig Start Date End Date Taking? Authorizing Provider  ?acetaminophen (TYLENOL) 325 MG tablet Take 2 tablets (650 mg total) by mouth every 6 (six) hours as needed for mild pain, fever or headache. 01/29/18   Hongalgi, Lenis Dickinson, MD  ?amLODipine (NORVASC) 5 MG tablet Take 1 tablet (5 mg total) by mouth daily. 06/21/19   Little Ishikawa, MD  ?apixaban (ELIQUIS) 5 MG TABS tablet Take 1 tablet (5 mg total) by mouth 2 (two) times daily. 11/27/20   Parrett, Fonnie Mu, NP  ?baclofen (LIORESAL) 10 MG tablet Take 10 mg by mouth 3 (three) times daily as needed. 06/27/19   [provider]  ?levETIRAcetam (KEPPRA) 500 MG tablet Take 1 tablet (500 mg total) by mouth 2 (two) times daily. 06/20/19   Little Ishikawa, MD  ?lisinopril-hydrochlorothiazide (ZESTORETIC) 20-25 MG tablet Take 1 tablet by mouth daily. 06/20/19   Little Ishikawa, MD  ?   ? ?Allergies    ?Patient has no known allergies.   ? ?Review of Systems   ?Review of Systems  ?Cardiovascular:  Positive for leg swelling.  ?All other systems reviewed and are negative. ? ?Physical Exam ?Updated Vital Signs ?BP (!) 171/58 (BP Location: Right Arm)   Pulse 89   Temp 98.5 ?F (36.9 ?C)   Resp (!) 21   Ht 5\' 2"  (1.575 m)   Wt 70.8 kg   LMP  (LMP Unknown)   SpO2 98%   BMI 28.53 kg/m?  ?Physical Exam ?Vitals and nursing  note reviewed.  ?Constitutional:   ?   Comments: Chronically ill  ?HENT:  ?   Nose: Nose normal.  ?   Mouth/Throat:  ?   Mouth: Mucous membranes are moist.  ?Eyes:  ?   Extraocular Movements: Extraocular movements intact.  ?   Pupils: Pupils are equal, round, and reactive to light.  ?Cardiovascular:  ?   Rate and Rhythm: Normal rate and regular rhythm.  ?   Pulses: Normal pulses.  ?   Heart sounds: Normal heart sounds.  ?Pulmonary:  ?   Effort: Pulmonary effort is normal.  ?   Breath sounds: Normal breath sounds.  ?Abdominal:  ?   General: Abdomen is flat.  ?   Palpations: Abdomen is soft.  ?Musculoskeletal:  ?   Cervical back: Normal range of motion and neck supple.  ?   Comments: Bilateral leg swelling worse on the left.  Patient has 1+ pitting edema on the left and trace pitting edema on the right. Mild L calf tenderness. No obvious cellulitis.  ?  ?Skin: ?   General: Skin is warm.  ?   Capillary Refill: Capillary refill takes less than 2 seconds.  ?Neurological:  ?   General: No focal deficit present.  ?   Mental Status: She is oriented  to person, place, and time.  ?Psychiatric:     ?   Mood and Affect: Mood normal.     ?   Behavior: Behavior normal.  ? ? ?ED Results / Procedures / Treatments   ?Labs ?(all labs ordered are listed, but only abnormal results are displayed) ?Labs Reviewed  ?CBC WITH DIFFERENTIAL/PLATELET  ?COMPREHENSIVE METABOLIC PANEL  ? ? ?EKG ?None ? ?Radiology ?No results found. ? ?Procedures ?Procedures  ? ? ?Medications Ordered in ED ?Medications - No data to display ? ?ED Course/ Medical Decision Making/ A&P ?  ?                        ?Medical Decision Making ?[name] is a 81 y.o. female here with bilateral leg swelling worse on the left.  Suspect dependent edema.  Given worsening swelling, will get DVT studies bilateral legs.  We will also check kidney function as well ? ?10:45 PM ?DVT study negative.  Creatinine is 1.1.  We will give several days of Lasix to help with leg swelling.   Also recommend compression stocking and leg elevation.  Stable for discharge ? ?Problems Addressed: ?Leg swelling: acute illness or injury ? ?Amount and/or Complexity of Data Reviewed ?Labs: ordered. Decision-making details documented in ED Course. ?Radiology: ordered and independent interpretation performed. Decision-making details documented in ED Course. ? ?Risk ?Prescription drug management. ? ?Final Clinical Impression(s) / ED Diagnoses ?Final diagnoses:  ?None  ? ? ?Rx / DC Orders ?ED Discharge Orders   ? ? None  ? ?  ? ? ?  ?Drenda Freeze, MD ?06/10/21 2246 ? ?

## 2023-08-18 ENCOUNTER — Emergency Department (HOSPITAL_COMMUNITY)

## 2023-08-18 ENCOUNTER — Inpatient Hospital Stay (HOSPITAL_COMMUNITY)
Admission: EM | Admit: 2023-08-18 | Discharge: 2023-08-23 | DRG: 682 | Disposition: A | Discharge status: Home / Self Care | Attending: Internal Medicine | Admitting: Internal Medicine

## 2023-08-18 ENCOUNTER — Encounter (HOSPITAL_COMMUNITY): Payer: Self-pay

## 2023-08-18 DIAGNOSIS — R5381 Other malaise: Secondary | ICD-10-CM | POA: Diagnosis present

## 2023-08-18 DIAGNOSIS — R197 Diarrhea, unspecified: Secondary | ICD-10-CM

## 2023-08-18 DIAGNOSIS — K529 Noninfective gastroenteritis and colitis, unspecified: Secondary | ICD-10-CM | POA: Diagnosis present

## 2023-08-18 DIAGNOSIS — D631 Anemia in chronic kidney disease: Secondary | ICD-10-CM | POA: Diagnosis present

## 2023-08-18 DIAGNOSIS — Z86718 Personal history of other venous thrombosis and embolism: Secondary | ICD-10-CM

## 2023-08-18 DIAGNOSIS — N189 Chronic kidney disease, unspecified: Secondary | ICD-10-CM | POA: Diagnosis present

## 2023-08-18 DIAGNOSIS — R748 Abnormal levels of other serum enzymes: Secondary | ICD-10-CM | POA: Insufficient documentation

## 2023-08-18 DIAGNOSIS — Z87442 Personal history of urinary calculi: Secondary | ICD-10-CM

## 2023-08-18 DIAGNOSIS — N179 Acute kidney failure, unspecified: Principal | ICD-10-CM | POA: Diagnosis present

## 2023-08-18 DIAGNOSIS — Z79899 Other long term (current) drug therapy: Secondary | ICD-10-CM

## 2023-08-18 DIAGNOSIS — E8729 Other acidosis: Secondary | ICD-10-CM | POA: Diagnosis present

## 2023-08-18 DIAGNOSIS — E875 Hyperkalemia: Secondary | ICD-10-CM | POA: Diagnosis present

## 2023-08-18 DIAGNOSIS — G40909 Epilepsy, unspecified, not intractable, without status epilepticus: Secondary | ICD-10-CM | POA: Diagnosis present

## 2023-08-18 DIAGNOSIS — I440 Atrioventricular block, first degree: Secondary | ICD-10-CM | POA: Diagnosis present

## 2023-08-18 DIAGNOSIS — Z7901 Long term (current) use of anticoagulants: Secondary | ICD-10-CM

## 2023-08-18 DIAGNOSIS — I129 Hypertensive chronic kidney disease with stage 1 through stage 4 chronic kidney disease, or unspecified chronic kidney disease: Secondary | ICD-10-CM | POA: Diagnosis present

## 2023-08-18 DIAGNOSIS — Z87891 Personal history of nicotine dependence: Secondary | ICD-10-CM

## 2023-08-18 DIAGNOSIS — I959 Hypotension, unspecified: Secondary | ICD-10-CM | POA: Diagnosis present

## 2023-08-18 DIAGNOSIS — G9341 Metabolic encephalopathy: Secondary | ICD-10-CM | POA: Diagnosis present

## 2023-08-18 DIAGNOSIS — E872 Acidosis, unspecified: Secondary | ICD-10-CM | POA: Diagnosis present

## 2023-08-18 DIAGNOSIS — M109 Gout, unspecified: Secondary | ICD-10-CM | POA: Diagnosis present

## 2023-08-18 DIAGNOSIS — E860 Dehydration: Secondary | ICD-10-CM | POA: Diagnosis present

## 2023-08-18 DIAGNOSIS — I10 Essential (primary) hypertension: Secondary | ICD-10-CM | POA: Diagnosis present

## 2023-08-18 DIAGNOSIS — N200 Calculus of kidney: Secondary | ICD-10-CM | POA: Diagnosis present

## 2023-08-18 DIAGNOSIS — I700 Atherosclerosis of aorta: Secondary | ICD-10-CM | POA: Diagnosis present

## 2023-08-18 DIAGNOSIS — E870 Hyperosmolality and hypernatremia: Secondary | ICD-10-CM | POA: Diagnosis present

## 2023-08-18 DIAGNOSIS — E1122 Type 2 diabetes mellitus with diabetic chronic kidney disease: Secondary | ICD-10-CM | POA: Diagnosis present

## 2023-08-18 DIAGNOSIS — Z87898 Personal history of other specified conditions: Secondary | ICD-10-CM

## 2023-08-18 DIAGNOSIS — Z86711 Personal history of pulmonary embolism: Secondary | ICD-10-CM

## 2023-08-18 DIAGNOSIS — N1831 Chronic kidney disease, stage 3a: Secondary | ICD-10-CM | POA: Diagnosis present

## 2023-08-18 LAB — TROPONIN I (HIGH SENSITIVITY): Troponin I (High Sensitivity): 16 ng/L (ref <18)

## 2023-08-18 LAB — MAGNESIUM: Magnesium: 2.6 mg/dL — ABNORMAL HIGH (ref 1.7–2.4)

## 2023-08-18 LAB — CBC WITH DIFFERENTIAL/PLATELET
Abs Immature Granulocytes: 0.01 10*3/uL (ref 0.00–0.07)
Basophils Absolute: 0 10*3/uL (ref 0.0–0.1)
Basophils Relative: 0 %
Eosinophils Absolute: 0.1 10*3/uL (ref 0.0–0.5)
Eosinophils Relative: 1 %
HCT: 37.3 % (ref 36.0–46.0)
Hemoglobin: 11.5 g/dL — ABNORMAL LOW (ref 12.0–15.0)
Immature Granulocytes: 0 %
Lymphocytes Relative: 21 %
Lymphs Abs: 1.2 10*3/uL (ref 0.7–4.0)
MCH: 27.3 pg (ref 26.0–34.0)
MCHC: 30.8 g/dL (ref 30.0–36.0)
MCV: 88.6 fL (ref 80.0–100.0)
Monocytes Absolute: 0.4 10*3/uL (ref 0.1–1.0)
Monocytes Relative: 6 %
Neutro Abs: 4.3 10*3/uL (ref 1.7–7.7)
Neutrophils Relative %: 72 %
Platelets: 178 10*3/uL (ref 150–400)
RBC: 4.21 MIL/uL (ref 3.87–5.11)
RDW: 15.2 % (ref 11.5–15.5)
WBC: 6 10*3/uL (ref 4.0–10.5)
nRBC: 0 % (ref 0.0–0.2)

## 2023-08-18 LAB — COMPREHENSIVE METABOLIC PANEL WITH GFR
ALT: 25 U/L (ref 0–44)
AST: 18 U/L (ref 15–41)
Albumin: 3.7 g/dL (ref 3.5–5.0)
Alkaline Phosphatase: 63 U/L (ref 38–126)
Anion gap: 22 — ABNORMAL HIGH (ref 5–15)
BUN: 183 mg/dL — ABNORMAL HIGH (ref 8–23)
CO2: 13 mmol/L — ABNORMAL LOW (ref 22–32)
Calcium: 9.9 mg/dL (ref 8.9–10.3)
Chloride: 110 mmol/L (ref 98–111)
Creatinine, Ser: 7.01 mg/dL — ABNORMAL HIGH (ref 0.44–1.00)
GFR, Estimated: 5 mL/min — ABNORMAL LOW (ref >60)
Glucose, Bld: 87 mg/dL (ref 70–99)
Potassium: 6.8 mmol/L (ref 3.5–5.1)
Sodium: 145 mmol/L (ref 135–145)
Total Bilirubin: 0.6 mg/dL (ref 0.0–1.2)
Total Protein: 8 g/dL (ref 6.5–8.1)

## 2023-08-18 LAB — I-STAT CHEM 8, ED
BUN: >130 mg/dL — ABNORMAL HIGH (ref 8–23)
Calcium, Ion: 1.19 mmol/L (ref 1.15–1.40)
Chloride: 120 mmol/L — ABNORMAL HIGH (ref 98–111)
Creatinine, Ser: 6.8 mg/dL — ABNORMAL HIGH (ref 0.44–1.00)
Glucose, Bld: 77 mg/dL (ref 70–99)
HCT: 33 % — ABNORMAL LOW (ref 36.0–46.0)
Hemoglobin: 11.2 g/dL — ABNORMAL LOW (ref 12.0–15.0)
Potassium: 5.8 mmol/L — ABNORMAL HIGH (ref 3.5–5.1)
Sodium: 144 mmol/L (ref 135–145)
TCO2: 14 mmol/L — ABNORMAL LOW (ref 22–32)

## 2023-08-18 LAB — LIPASE, BLOOD: Lipase: 525 U/L — ABNORMAL HIGH (ref 11–51)

## 2023-08-18 LAB — I-STAT CG4 LACTIC ACID, ED: Lactic Acid, Venous: 0.8 mmol/L (ref 0.5–1.9)

## 2023-08-18 MED ORDER — DEXTROSE 50 % IV SOLN
1.0000 | Freq: Once | INTRAVENOUS | Status: AC
  Administered 2023-08-18: 50 mL via INTRAVENOUS
  Filled 2023-08-18: qty 50

## 2023-08-18 MED ORDER — SODIUM CHLORIDE 0.9 % IV BOLUS
1000.0000 mL | Freq: Once | INTRAVENOUS | Status: AC
  Administered 2023-08-18: 1000 mL via INTRAVENOUS

## 2023-08-18 MED ORDER — SODIUM ZIRCONIUM CYCLOSILICATE 10 G PO PACK
10.0000 g | PACK | Freq: Once | ORAL | Status: AC
  Administered 2023-08-18: 10 g via ORAL
  Filled 2023-08-18: qty 1

## 2023-08-18 MED ORDER — CALCIUM GLUCONATE-NACL 1-0.675 GM/50ML-% IV SOLN
1.0000 g | Freq: Once | INTRAVENOUS | Status: AC
  Administered 2023-08-18: 1000 mg via INTRAVENOUS
  Filled 2023-08-18: qty 50

## 2023-08-18 MED ORDER — INSULIN ASPART 100 UNIT/ML IV SOLN
10.0000 [IU] | Freq: Once | INTRAVENOUS | Status: AC
  Administered 2023-08-18: 10 [IU] via INTRAVENOUS

## 2023-08-18 MED ORDER — SODIUM BICARBONATE 8.4 % IV SOLN
INTRAVENOUS | Status: DC
  Filled 2023-08-18: qty 1000

## 2023-08-18 NOTE — ED Provider Notes (Incomplete)
 [facility]  [facility]OSPITAL Provider Note   CSN: [id number] Arrival date & time: 08/18/23  2017     Patient presents with: Dysuria (Pt arrived by ems d/t worsening weakness and dysuria x3 weeks. Per ems, pt has been dealing with UTI symptoms and has progressively gotten worse. Per ems, family is stating she is now altered from baseline and not acting like herself. Per ems, pt was hypotensive upon their arrival so they gave 500 ml of normal saline that brought her back to normotensive levels.)   Glenda Blankenship is a 83 y.o. female.  He is brought in by ambulance from home for weakness.  She tells me for 1 week she has had diarrhea and no appetite.  Little bit of vomiting.  Minimal cough.  No fevers or chills.  Getting progressively weak and now unable to ambulate.  No sick contacts or recent travel.  No blood in the vomitus or diarrhea.  Has tried nothing for her symptoms.  EMS said she was initially hypotensive and gave her some fluids.  She denies any headache chest pain shortness of breath.  Has had some dysuria.  Lives alone.  Uses a walker to ambulate.  {Add pertinent medical, surgical, social history, OB history to [id number]} The history is provided by the patient and a relative.  Diarrhea Severity:  Moderate Onset quality:  Gradual Duration:  1 week Timing:  Intermittent Progression:  Unchanged Relieved by:  Nothing Worsened by:  Nothing Ineffective treatments:  None tried Associated symptoms: cough and vomiting   Associated symptoms: no abdominal pain, no fever and no headaches   Risk factors: no recent antibiotic use, no sick contacts, no suspicious food intake and no travel to endemic areas        Prior to Admission medications   Medication Sig Start Date End Date Taking? Authorizing Provider  acetaminophen  (TYLENOL ) 325 MG tablet Take 2 tablets (650 mg total) by mouth every 6 (six) hours as needed for mild pain, fever or headache. 01/29/18    Hongalgi, Anand D, MD  amLODipine  (NORVASC ) 5 MG tablet Take 1 tablet (5 mg total) by mouth daily. 06/21/19   Haydee Lipa, MD  apixaban  (ELIQUIS ) 5 MG TABS tablet Take 1 tablet (5 mg total) by mouth 2 (two) times daily. 11/27/20   Parrett, Macdonald Savoy, NP  baclofen (LIORESAL) 10 MG tablet Take 10 mg by mouth 3 (three) times daily as needed. 06/27/19   [provider]  furosemide  (LASIX ) 20 MG tablet Take 1 tablet (20 mg total) by mouth daily. 06/10/21   Dalene Duck, MD  levETIRAcetam  (KEPPRA ) 500 MG tablet Take 1 tablet (500 mg total) by mouth 2 (two) times daily. 06/20/19   Haydee Lipa, MD  lisinopril -hydrochlorothiazide  (ZESTORETIC ) 20-25 MG tablet Take 1 tablet by mouth daily. 06/20/19   Haydee Lipa, MD    Allergies: Patient has no known allergies.    Review of Systems  Constitutional:  Positive for fatigue. Negative for fever.  Respiratory:  Positive for cough. Negative for shortness of breath.   Cardiovascular:  Negative for chest pain.  Gastrointestinal:  Positive for diarrhea and vomiting. Negative for abdominal pain.  Genitourinary:  Positive for dysuria.  Neurological:  Positive for weakness. Negative for headaches.    Updated Vital Signs BP (!) 122/38 (BP Location: Right Arm)   Pulse 83   Temp (!) 97.4 F (36.3 C) (Oral)   Resp 18   Ht 5' 2 (1.575 m)  Wt 72.6 kg   LMP  (LMP Unknown)   SpO2 100%   BMI 29.26 kg/m   Physical Exam Vitals and nursing note reviewed.  Constitutional:      General: She is not in acute distress.    Appearance: Normal appearance. She is well-developed.  HENT:     Head: Normocephalic and atraumatic.   Eyes:     Conjunctiva/sclera: Conjunctivae normal.    Cardiovascular:     Rate and Rhythm: Normal rate and regular rhythm.     Heart sounds: No murmur heard. Pulmonary:     Effort: Pulmonary effort is normal. No respiratory distress.     Breath sounds: Normal breath sounds. No stridor. No wheezing.   Abdominal:     Palpations: Abdomen is soft.     Tenderness: There is no abdominal tenderness. There is no guarding or rebound.   Musculoskeletal:        General: No tenderness or deformity. Normal range of motion.     Cervical back: Neck supple.   Skin:    General: Skin is warm and dry.   Neurological:     General: No focal deficit present.     Mental Status: She is alert and oriented to person, place, and time.     GCS: GCS eye subscore is 4. GCS verbal subscore is 5. GCS motor subscore is 6.     Cranial Nerves: No cranial nerve deficit.     Sensory: No sensory deficit.     Motor: No weakness.     (all labs ordered are listed, but only abnormal results are displayed) Labs Reviewed  COMPREHENSIVE METABOLIC PANEL WITH GFR - Abnormal; Notable for the following components:      Result Value   Potassium 6.8 (*)    CO2 13 (*)    BUN 183 (*)    Creatinine, Ser 7.01 (*)    GFR, Estimated 5 (*)    Anion gap 22 (*)    All other components within normal limits  LIPASE, BLOOD - Abnormal; Notable for the following components:   Lipase 525 (*)    All other components within normal limits  CBC WITH DIFFERENTIAL/PLATELET - Abnormal; Notable for the following components:   Hemoglobin 11.5 (*)    All other components within normal limits  MAGNESIUM  - Abnormal; Notable for the following components:   Magnesium  2.6 (*)    All other components within normal limits  I-STAT CHEM 8, ED - Abnormal; Notable for the following components:   Potassium 5.8 (*)    Chloride 120 (*)    BUN >130 (*)    Creatinine, Ser 6.80 (*)    TCO2 14 (*)    Hemoglobin 11.2 (*)    HCT 33.0 (*)    All other components within normal limits  CULTURE, BLOOD (ROUTINE X 2)  GASTROINTESTINAL PANEL BY PCR, STOOL (REPLACES STOOL CULTURE)  C DIFFICILE QUICK SCREEN W PCR REFLEX    CULTURE, BLOOD (ROUTINE X 2)  URINALYSIS, W/ REFLEX TO CULTURE (INFECTION SUSPECTED)  I-STAT CG4 LACTIC ACID, ED  I-STAT CG4 LACTIC ACID,  ED  TROPONIN I (HIGH SENSITIVITY)    EKG: EKG Interpretation Date/Time:  Friday August 18 2023 20:24:57 EDT Ventricular Rate:  79 PR Interval:  141 QRS Duration:  83 QT Interval:  384 QTC Calculation: 441 R Axis:   40  Text Interpretation: Sinus rhythm inferior ST and T changes from prior 4/21 Confirmed by Racheal Buddle [phone]) on 08/18/2023 9:14:31 PM  Radiology: No results  found.  {Document cardiac monitor, telemetry assessment procedure when appropriate:32947} Procedures   Medications Ordered in the ED  sodium chloride  0.9 % bolus 1,000 mL (has no administration in time range)    Clinical Course as of 08/18/23 2323  Fri Aug 18, 2023  2247 discussed with nephrology Dr. Christianne Cowper.  She agrees with current management and she is going to add on a bicarb drip also.  She will have nephrology see in consult. [MB]  2248 I reviewed patient's workup with her and she understands she will need admission to the hospital for further management.  She is in agreement with plan for admission. [MB]    Clinical Course User Index [MB] Tonya Fredrickson, MD   {Click here for ABCD2, HEART and other calculators REFRESH Note before signing:1}                              Medical Decision Making Amount and/or Complexity of Data Reviewed Labs: ordered. Radiology: ordered.  Risk OTC drugs. Prescription drug management.   This patient complains of ***; this involves an extensive number of treatment Options and is a complaint that carries with it a high risk of complications and morbidity. The differential includes ***  I ordered, reviewed and interpreted labs, which included *** I ordered medication *** and reviewed PMP when indicated. I ordered imaging studies which included *** and I independently    visualized and interpreted imaging which showed *** Additional history obtained from *** Previous records obtained and reviewed *** I consulted *** and discussed lab and imaging findings and  discussed disposition.  Cardiac monitoring reviewed, *** Social determinants considered, *** Critical Interventions: ***  After the interventions stated above, I reevaluated the patient and found *** Admission and further testing considered, ***   {Document critical care time when appropriate  Document review of labs and clinical decision tools ie CHADS2VASC2, etc  Document your independent review of radiology images and any outside records  Document your discussion with family members, caretakers and with consultants  Document social determinants of health affecting pt's care  Document your decision making why or why not admission, treatments were needed:32947:::1}   Final diagnoses:  None    ED Discharge Orders     None

## 2023-08-18 NOTE — H&P (Incomplete)
 History and Physical    Glenda Blankenship ZOX:[id number] DOB: November 11, 1940 DOA: 08/18/2023  PCP: Forest Idol, NP   Patient coming from: Home   Chief Complaint:  Chief Complaint  Patient presents with  . Dysuria    Pt arrived by ems d/t worsening weakness and dysuria x3 weeks. Per ems, pt has been dealing with UTI symptoms and has progressively gotten worse. Per ems, family is stating she is now altered from baseline and not acting like herself. Per ems, pt was hypotensive upon their arrival so they gave 500 ml of normal saline that brought her back to normotensive levels.   HPI:  Glenda Blankenship is a 83 y.o. 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  Patient is presenting to emergency department via EMS with complaining of generalized weakness and dysuria for 3 weeks.  EMS reported that patient has been dealing with UTI symptoms and getting progressively worse.  Per family patient is more altered as compared to the baseline and not acting like herself.  Per EMS patient found hypotensive upon arrival received 500 mL of NS bolus which improved the blood pressure. At presentation to ED patient reported that she has 1 week of diarrhea and poor appetite.  Reported minimal vomiting and cough.  Denies any fever and chills.  Progressively getting worse and unable to ambulate due to weakness.  No recent sick contact and recent travel.  ED Course:  At presentation to ED patient is hemodynamically stable. CMP showed elevated potassium 6.8 and repeat potassium is 5.8.  Elevated BUN 183, creatinine 7.01, low bicarb 13 elevated and gap 22. Pending UA. CBC unremarkable stable H&H and normal WBC platelet count. Blood cultures are in process. Elevated lipase level 525.   Significant labs in the ED:  Lab Orders         Gastrointestinal Panel by PCR , Stool         C Difficile Quick Screen w PCR reflex         Culture, blood  (routine x 2)         Comprehensive metabolic panel         Lipase, blood         CBC with Differential         Magnesium          Urinalysis, w/ Reflex to Culture (Infection Suspected) -Urine, Clean Catch         I-Stat Lactic Acid         I-stat chem 8, ED       Review of Systems:  ROS  Past Medical History:  Diagnosis Date  . Diabetes [facility])    patient denies  . DVT (deep vein thrombosis) in pregnancy   . Hypertension   . Pneumonia   . Renal disorder   . Seizures (HCC) 05/2019    Past Surgical History:  Procedure Laterality Date  . kidney stone removal       reports that she has quit smoking. Her smoking use included cigarettes. She started smoking about 67 years ago. She has a 33.6 pack-year smoking history. She has never used smokeless tobacco. She reports current alcohol use of about 2.0 standard drinks of alcohol per week. She reports that she does not use drugs.  No Known Allergies  History reviewed. No pertinent family history.  Prior to Admission medications   Medication Sig Start Date End Date Taking? Authorizing Provider  acetaminophen  (TYLENOL )  325 MG tablet Take 2 tablets (650 mg total) by mouth every 6 (six) hours as needed for mild pain, fever or headache. 01/29/18   Hongalgi, Anand D, MD  amLODipine  (NORVASC ) 5 MG tablet Take 1 tablet (5 mg total) by mouth daily. 06/21/19   Haydee Lipa, MD  apixaban  (ELIQUIS ) 5 MG TABS tablet Take 1 tablet (5 mg total) by mouth 2 (two) times daily. 11/27/20   Parrett, Macdonald Savoy, NP  baclofen (LIORESAL) 10 MG tablet Take 10 mg by mouth 3 (three) times daily as needed. 06/27/19   [provider]  furosemide  (LASIX ) 20 MG tablet Take 1 tablet (20 mg total) by mouth daily. 06/10/21   Dalene Duck, MD  levETIRAcetam  (KEPPRA ) 500 MG tablet Take 1 tablet (500 mg total) by mouth 2 (two) times daily. 06/20/19   Haydee Lipa, MD  lisinopril -hydrochlorothiazide  (ZESTORETIC ) 20-25 MG tablet Take 1 tablet by mouth  daily. 06/20/19   Haydee Lipa, MD     Physical Exam: Vitals:   08/18/23 2031 08/18/23 2100 08/18/23 2130 08/18/23 2145  BP:  (!) 111/43 (!) 119/38 (!) 132/37  Pulse:  (!) 116    Resp:  20 (!) 25 13  Temp:      TempSrc:      SpO2:  (!) 87%    Weight: 72.6 kg     Height: 5' 2 (1.575 m)       Physical Exam   Labs on Admission: I have personally reviewed following labs and imaging studies  CBC: Recent Labs  Lab 08/18/23 2123 08/18/23 2242  WBC 6.0  --   NEUTROABS 4.3  --   HGB 11.5* 11.2*  HCT 37.3 33.0*  MCV 88.6  --   PLT 178  --    Basic Metabolic Panel: Recent Labs  Lab 08/18/23 2123 08/18/23 2242  NA 145 144  K 6.8* 5.8*  CL 110 120*  CO2 13*  --   GLUCOSE 87 77  BUN 183* >130*  CREATININE 7.01* 6.80*  CALCIUM  9.9  --   MG 2.6*  --    GFR: Estimated Creatinine Clearance: 5.8 mL/min (A) (by C-G formula based on SCr of 6.8 mg/dL (H)). Liver Function Tests: Recent Labs  Lab 08/18/23 2123  AST 18  ALT 25  ALKPHOS 63  BILITOT 0.6  PROT 8.0  ALBUMIN 3.7   Recent Labs  Lab 08/18/23 2123  LIPASE 525*   No results for input(s): AMMONIA in the last 168 hours. Coagulation Profile: No results for input(s): INR, PROTIME in the last 168 hours. Cardiac Enzymes: Recent Labs  Lab 08/18/23 2123  TROPONINIHS 16   BNP (last 3 results) No results for input(s): BNP in the last 8760 hours. HbA1C: No results for input(s): HGBA1C in the last 72 hours. CBG: No results for input(s): GLUCAP in the last 168 hours. Lipid Profile: No results for input(s): CHOL, HDL, LDLCALC, TRIG, CHOLHDL, LDLDIRECT in the last 72 hours. Thyroid  Function Tests: No results for input(s): TSH, T4TOTAL, FREET4, T3FREE, THYROIDAB in the last 72 hours. Anemia Panel: No results for input(s): VITAMINB12, FOLATE, FERRITIN, TIBC, IRON, RETICCTPCT in the last 72 hours. Urine analysis: No results found for: COLORURINE,  APPEARANCEUR, LABSPEC, PHURINE, GLUCOSEU, HGBUR, BILIRUBINUR, KETONESUR, PROTEINUR, UROBILINOGEN, NITRITE, LEUKOCYTESUR  Radiological Exams on Admission: I have personally reviewed images CT Renal Stone Study Result Date: 08/18/2023 CLINICAL DATA:  Renal infarction, dysuria EXAM: CT ABDOMEN AND PELVIS WITHOUT CONTRAST TECHNIQUE: Multidetector CT imaging of the abdomen and pelvis was  performed following the standard protocol without IV contrast. RADIATION DOSE REDUCTION: This exam was performed according to the departmental dose-optimization program which includes automated exposure control, adjustment of the mA and/or kV according to patient size and/or use of iterative reconstruction technique. COMPARISON:  01/08/2016 FINDINGS: Lower chest: No acute abnormality. Hepatobiliary: No focal liver abnormality is seen. No gallstones, gallbladder wall thickening, or biliary dilatation. Pancreas: Unremarkable Spleen: Unremarkable Adrenals/Urinary Tract: The adrenal glands are unremarkable. The kidneys are normal in size and position. 4 mm crescentic nonobstructing calculus is seen within the lower pole of the right kidney. The kidneys are otherwise unremarkable on this noncontrast examination. Small congenital right posterolateral bladder diverticulum. The bladder is otherwise unremarkable. Stomach/Bowel: Stomach is within normal limits. Appendix appears normal. No evidence of bowel wall thickening, distention, or inflammatory changes. Vascular/Lymphatic: Aortic atherosclerosis. No enlarged abdominal or pelvic lymph nodes. Reproductive: Multiple calcified involuted fibroids are noted within the uterus. The pelvic organs are otherwise unremarkable. Other: No abdominal wall hernia or abnormality. No abdominopelvic ascites. Musculoskeletal: No acute bone abnormality. No lytic or blastic bone lesion. Osseous structures are age appropriate. IMPRESSION: 1. No acute intra-abdominal pathology identified.  Minimal nonobstructing right nephrolithiasis. Note that renal perfusion is not well assessed on this noncontrast examination. 2. Aortic atherosclerosis. Aortic Atherosclerosis (ICD10-I70.0). Electronically Signed   By: Worthy Heads M.D.   On: 08/18/2023 23:55   DG Chest Port 1 View Result Date: 08/18/2023 CLINICAL DATA:  Cough EXAM: PORTABLE CHEST 1 VIEW COMPARISON:  01/26/2018 FINDINGS: No acute airspace disease or effusion. Stable cardiomediastinal silhouette with aortic atherosclerosis. Chronic right-sided rib deformities. IMPRESSION: No active disease. Electronically Signed   By: Esmeralda Hedge M.D.   On: 08/18/2023 22:15     EKG: My personal interpretation of EKG shows: ***    Assessment/Plan: Active Problems:   * No active hospital problems. *    Assessment and Plan: No notes have been filed under this hospital service. Service: Hospitalist      DVT prophylaxis:  {Blank single:19197::Lovenox,SQ Heparin ,IV heparin  gtts,Xarelto,Eliquis ,Coumadin,SCDs,***} Code Status:  {Blank single:19197::Full Code,DNR with Intubation,DNR/DNI(Do NOT Intubate),Comfort Care,***} Diet:  Family Communication:  *** Family was present at bedside, at the time of interview.  Opportunity was given to ask question and all questions were answered satisfactorily.  Disposition Plan:  ***  Consults:  ***  Admission status:   {Blank single:19197::Observation,Inpatient}, {Blank single:19197::Med-Surg,Telemetry bed,Step Down Unit}  Severity of Illness: {Observation/Inpatient:21159}    Jetty Mort, MD [facility]  How to contact the [facility] Attending or Consulting provider 7A - 7P or covering provider during after hours 7P -7A, for this patient.  Check the care team in [facility] and look for a) attending/consulting [facility] provider listed and b) the [facility] team listed Log into [email or web address] and use [place]'s universal password to access. If you do not have the password,  please contact the hospital operator. Locate the [facility] provider you are looking for under [facility] and page to a number that you can be directly reached. If you still have difficulty reaching the provider, please page the [facility] (Director on Call) for the Hospitalists listed on amion for assistance.  08/19/2023, 12:00 AM

## 2023-08-18 NOTE — ED Provider Notes (Signed)
 [place] [facility] Provider Note   CSN: [id number] Arrival date & time: 08/18/23  2017     Patient presents with: Dysuria (Pt arrived by ems d/t worsening weakness and dysuria x3 weeks. Per ems, pt has been dealing with UTI symptoms and has progressively gotten worse. Per ems, family is stating she is now altered from baseline and not acting like herself. Per ems, pt was hypotensive upon their arrival so they gave 500 ml of normal saline that brought her back to normotensive levels.)   Glenda Blankenship is a 83 y.o. female.  He is brought in by ambulance from home for weakness.  She tells me for 1 week she has had diarrhea and no appetite.  Little bit of vomiting.  Minimal cough.  No fevers or chills.  Getting progressively weak and now unable to ambulate.  No sick contacts or recent travel.  No blood in the vomitus or diarrhea.  Has tried nothing for her symptoms.  EMS said she was initially hypotensive and gave her some fluids.  She denies any headache chest pain shortness of breath.  Has had some dysuria.  Lives alone.  Uses a walker to ambulate.   The history is provided by the patient and a relative.  Diarrhea Severity:  Moderate Onset quality:  Gradual Duration:  1 week Timing:  Intermittent Progression:  Unchanged Relieved by:  Nothing Worsened by:  Nothing Ineffective treatments:  None tried Associated symptoms: cough and vomiting   Associated symptoms: no abdominal pain, no fever and no headaches   Risk factors: no recent antibiotic use, no sick contacts, no suspicious food intake and no travel to endemic areas        Prior to Admission medications   Medication Sig Start Date End Date Taking? Authorizing Provider  acetaminophen  (TYLENOL ) 325 MG tablet Take 2 tablets (650 mg total) by mouth every 6 (six) hours as needed for mild pain, fever or headache. 01/29/18   Hongalgi, Anand D, MD  amLODipine  (NORVASC ) 5 MG tablet Take 1 tablet (5 mg  total) by mouth daily. 06/21/19   Lue Elsie BROCKS, MD  apixaban  (ELIQUIS ) 5 MG TABS tablet Take 1 tablet (5 mg total) by mouth 2 (two) times daily. 11/27/20   Parrett, Madelin RAMAN, NP  baclofen (LIORESAL) 10 MG tablet Take 10 mg by mouth 3 (three) times daily as needed. 06/27/19   [provider]  furosemide  (LASIX ) 20 MG tablet Take 1 tablet (20 mg total) by mouth daily. 06/10/21   Patt Alm Macho, MD  levETIRAcetam  (KEPPRA ) 500 MG tablet Take 1 tablet (500 mg total) by mouth 2 (two) times daily. 06/20/19   Lue Elsie BROCKS, MD  lisinopril -hydrochlorothiazide  (ZESTORETIC ) 20-25 MG tablet Take 1 tablet by mouth daily. 06/20/19   Lue Elsie BROCKS, MD    Allergies: Patient has no known allergies.    Review of Systems  Constitutional:  Positive for fatigue. Negative for fever.  Respiratory:  Positive for cough. Negative for shortness of breath.   Cardiovascular:  Negative for chest pain.  Gastrointestinal:  Positive for diarrhea and vomiting. Negative for abdominal pain.  Genitourinary:  Positive for dysuria.  Neurological:  Positive for weakness. Negative for headaches.    Updated Vital Signs BP (!) 122/38 (BP Location: Right Arm)   Pulse 83   Temp (!) 97.4 F (36.3 C) (Oral)   Resp 18   Ht 5' 2 (1.575 m)   Wt 72.6 kg   LMP  (LMP  Unknown)   SpO2 100%   BMI 29.26 kg/m   Physical Exam Vitals and nursing note reviewed.  Constitutional:      General: She is not in acute distress.    Appearance: Normal appearance. She is well-developed.  HENT:     Head: Normocephalic and atraumatic.   Eyes:     Conjunctiva/sclera: Conjunctivae normal.    Cardiovascular:     Rate and Rhythm: Normal rate and regular rhythm.     Heart sounds: No murmur heard. Pulmonary:     Effort: Pulmonary effort is normal. No respiratory distress.     Breath sounds: Normal breath sounds. No stridor. No wheezing.  Abdominal:     Palpations: Abdomen is soft.     Tenderness: There is no  abdominal tenderness. There is no guarding or rebound.   Musculoskeletal:        General: No tenderness or deformity. Normal range of motion.     Cervical back: Neck supple.   Skin:    General: Skin is warm and dry.   Neurological:     General: No focal deficit present.     Mental Status: She is alert and oriented to person, place, and time.     GCS: GCS eye subscore is 4. GCS verbal subscore is 5. GCS motor subscore is 6.     Cranial Nerves: No cranial nerve deficit.     Sensory: No sensory deficit.     Motor: No weakness.     (all labs ordered are listed, but only abnormal results are displayed) Labs Reviewed  COMPREHENSIVE METABOLIC PANEL WITH GFR - Abnormal; Notable for the following components:      Result Value   Potassium 6.8 (*)    CO2 13 (*)    BUN 183 (*)    Creatinine, Ser 7.01 (*)    GFR, Estimated 5 (*)    Anion gap 22 (*)    All other components within normal limits  LIPASE, BLOOD - Abnormal; Notable for the following components:   Lipase 525 (*)    All other components within normal limits  CBC WITH DIFFERENTIAL/PLATELET - Abnormal; Notable for the following components:   Hemoglobin 11.5 (*)    All other components within normal limits  MAGNESIUM  - Abnormal; Notable for the following components:   Magnesium  2.6 (*)    All other components within normal limits  URINALYSIS, W/ REFLEX TO CULTURE (INFECTION SUSPECTED) - Abnormal; Notable for the following components:   Bacteria, UA RARE (*)    All other components within normal limits  BASIC METABOLIC PANEL WITH GFR - Abnormal; Notable for the following components:   Chloride 114 (*)    CO2 13 (*)    Glucose, Bld 134 (*)    BUN 172 (*)    Creatinine, Ser 6.04 (*)    GFR, Estimated 6 (*)    Anion gap 16 (*)    All other components within normal limits  CBC - Abnormal; Notable for the following components:   RBC 3.53 (*)    Hemoglobin 9.7 (*)    HCT 31.2 (*)    All other components within normal limits   COMPREHENSIVE METABOLIC PANEL WITH GFR - Abnormal; Notable for the following components:   Chloride 115 (*)    CO2 14 (*)    Glucose, Bld 105 (*)    BUN 161 (*)    Creatinine, Ser 5.30 (*)    Calcium  8.8 (*)    Albumin 2.9 (*)  GFR, Estimated 8 (*)    Anion gap 16 (*)    All other components within normal limits  AMMONIA - Abnormal; Notable for the following components:   Ammonia 64 (*)    All other components within normal limits  I-STAT CHEM 8, ED - Abnormal; Notable for the following components:   Potassium 5.8 (*)    Chloride 120 (*)    BUN >130 (*)    Creatinine, Ser 6.80 (*)    TCO2 14 (*)    Hemoglobin 11.2 (*)    HCT 33.0 (*)    All other components within normal limits  CBG MONITORING, ED - Abnormal; Notable for the following components:   Glucose-Capillary 201 (*)    All other components within normal limits  CULTURE, BLOOD (ROUTINE X 2)  CULTURE, BLOOD (ROUTINE X 2)  GASTROINTESTINAL PANEL BY PCR, STOOL (REPLACES STOOL CULTURE)  C DIFFICILE QUICK SCREEN W PCR REFLEX    SODIUM, URINE, RANDOM  CREATININE, URINE, RANDOM  MICROALBUMIN / CREATININE URINE RATIO  I-STAT CG4 LACTIC ACID, ED  I-STAT CG4 LACTIC ACID, ED  TROPONIN I (HIGH SENSITIVITY)    EKG: EKG Interpretation Date/Time:  Friday August 18 2023 20:24:57 EDT Ventricular Rate:  79 PR Interval:  141 QRS Duration:  83 QT Interval:  384 QTC Calculation: 441 R Axis:   40  Text Interpretation: Sinus rhythm inferior ST and T changes from prior 4/21 Confirmed by Towana Sharper [phone]) on 08/18/2023 9:14:31 PM  Radiology: CT Renal Stone Study Result Date: 08/18/2023 CLINICAL DATA:  Renal infarction, dysuria EXAM: CT ABDOMEN AND PELVIS WITHOUT CONTRAST TECHNIQUE: Multidetector CT imaging of the abdomen and pelvis was performed following the standard protocol without IV contrast. RADIATION DOSE REDUCTION: This exam was performed according to the departmental dose-optimization program which includes automated  exposure control, adjustment of the mA and/or kV according to patient size and/or use of iterative reconstruction technique. COMPARISON:  01/08/2016 FINDINGS: Lower chest: No acute abnormality. Hepatobiliary: No focal liver abnormality is seen. No gallstones, gallbladder wall thickening, or biliary dilatation. Pancreas: Unremarkable Spleen: Unremarkable Adrenals/Urinary Tract: The adrenal glands are unremarkable. The kidneys are normal in size and position. 4 mm crescentic nonobstructing calculus is seen within the lower pole of the right kidney. The kidneys are otherwise unremarkable on this noncontrast examination. Small congenital right posterolateral bladder diverticulum. The bladder is otherwise unremarkable. Stomach/Bowel: Stomach is within normal limits. Appendix appears normal. No evidence of bowel wall thickening, distention, or inflammatory changes. Vascular/Lymphatic: Aortic atherosclerosis. No enlarged abdominal or pelvic lymph nodes. Reproductive: Multiple calcified involuted fibroids are noted within the uterus. The pelvic organs are otherwise unremarkable. Other: No abdominal wall hernia or abnormality. No abdominopelvic ascites. Musculoskeletal: No acute bone abnormality. No lytic or blastic bone lesion. Osseous structures are age appropriate. IMPRESSION: 1. No acute intra-abdominal pathology identified. Minimal nonobstructing right nephrolithiasis. Note that renal perfusion is not well assessed on this noncontrast examination. 2. Aortic atherosclerosis. Aortic Atherosclerosis (ICD10-I70.0). Electronically Signed   By: Dorethia Molt M.D.   On: 08/18/2023 23:55   DG Chest Port 1 View Result Date: 08/18/2023 CLINICAL DATA:  Cough EXAM: PORTABLE CHEST 1 VIEW COMPARISON:  01/26/2018 FINDINGS: No acute airspace disease or effusion. Stable cardiomediastinal silhouette with aortic atherosclerosis. Chronic right-sided rib deformities. IMPRESSION: No active disease. Electronically Signed   By: Luke Bun M.D.   On: 08/18/2023 22:15     .Critical Care  Performed by: Towana Sharper BROCKS, MD Authorized by: Towana Sharper BROCKS, MD   Critical  care provider statement:    Critical care time (minutes):  45   Critical care time was exclusive of:  Separately billable procedures and treating other patients   Critical care was necessary to treat or prevent imminent or life-threatening deterioration of the following conditions:  Renal failure, metabolic crisis and dehydration   Critical care was time spent personally by me on the following activities:  Development of treatment plan with patient or surrogate, discussions with consultants, evaluation of patient's response to treatment, examination of patient, obtaining history from patient or surrogate, ordering and performing treatments and interventions, ordering and review of laboratory studies, ordering and review of radiographic studies, pulse oximetry, re-evaluation of patient's condition and review of old charts   I assumed direction of critical care for this patient from another provider in my specialty: no      Medications Ordered in the ED  Chlorhexidine  Gluconate Cloth 2 % PADS 6 each (6 each Topical Not Given 08/19/23 0932)  Chlorhexidine  Gluconate Cloth 2 % PADS 6 each (6 each Topical Not Given 08/19/23 0932)  sodium chloride  flush (NS) 0.9 % injection 3 mL (3 mLs Intravenous Given 08/19/23 0932)  sodium chloride  flush (NS) 0.9 % injection 3 mL (3 mLs Intravenous Given 08/19/23 0932)  sodium chloride  flush (NS) 0.9 % injection 3 mL (has no administration in time range)  0.9 %  sodium chloride  infusion (has no administration in time range)  acetaminophen  (TYLENOL ) tablet 650 mg (has no administration in time range)    Or  acetaminophen  (TYLENOL ) suppository 650 mg (has no administration in time range)  trimethobenzamide (TIGAN) injection 200 mg (has no administration in time range)  bismuth subsalicylate (PEPTO BISMOL) 262 MG/15ML suspension  30 mL (has no administration in time range)  apixaban  (ELIQUIS ) tablet 5 mg (5 mg Oral Given 08/19/23 0932)  sodium bicarbonate  150 mEq in dextrose  5 % 1,150 mL infusion ( Intravenous Rate/Dose Verify 08/19/23 0725)  sodium chloride  0.9 % bolus 1,000 mL (0 mLs Intravenous Stopped 08/18/23 2331)  sodium chloride  0.9 % bolus 1,000 mL (0 mLs Intravenous Stopped 08/19/23 0005)  insulin  aspart (novoLOG ) injection 10 Units (10 Units Intravenous Given 08/18/23 2300)    And  dextrose  50 % solution 50 mL (50 mLs Intravenous Given 08/18/23 2301)  sodium zirconium cyclosilicate  (LOKELMA ) packet 10 g (10 g Oral Given 08/18/23 2257)  calcium  gluconate 1 g/ 50 mL sodium chloride  IVPB (0 mg Intravenous Stopped 08/18/23 2331)    Clinical Course as of 08/19/23 0009  Fri Aug 18, 2023  2247 discussed with nephrology Dr. Gearline.  She agrees with current management and she is going to add on a bicarb drip also.  She will have nephrology see in consult. [MB]  2248 I reviewed patient's workup with her and she understands she will need admission to the hospital for further management.  She is in agreement with plan for admission. [MB]  Sat Aug 19, 2023  0009 discuss with the hospitalist Dr. Sundil who will evaluate patient for admission. [MB]    Clinical Course User Index [MB] Towana Ozell BROCKS, MD                                 Medical Decision Making Amount and/or Complexity of Data Reviewed Labs: ordered. Radiology: ordered.  Risk OTC drugs. Prescription drug management. Decision regarding hospitalization.   This patient complains of general weakness nausea vomiting diarrhea urinary symptoms; this involves an  extensive number of treatment Options and is a complaint that carries with it a high risk of complications and morbidity. The differential includes dehydration, renal failure, metabolic derangement, infection, sepsis, Sirs  I ordered, reviewed and interpreted labs, which included CBC with normal white  count stable hemoglobin, chemistries with new acute renal insufficiency low bicarb elevated creatinine elevated potassium, urinalysis without clear signs of infection, lactate normal, ammonia elevated lipase elevated, troponins flat, stool studies ordered I ordered medication IV fluids, medication for hyperkalemia and reviewed PMP when indicated. I ordered imaging studies which included chest x-ray, CT renal and I independently    visualized and interpreted imaging which showed no significant obstructive findings Additional history obtained from patient's family members Previous records obtained and reviewed in epic, no recent admissions I consulted nephrology Dr. Gearline and [facility] hospitalist Dr. Lee and discussed lab and imaging findings and discussed disposition.  Cardiac monitoring reviewed, sinus rhythm Social determinants considered, no significant barriers Critical Interventions: Identification and treatment of hyperkalemia and acute renal failure with fluids and medications for lowering potassium  After the interventions stated above, I reevaluated the patient and found patient to be awake and alert, hemodynamically stable Admission and further testing considered, she would benefit from mission to the hospital for continued management.  She is in agreement with plan for admission.      Final diagnoses:  AKI (acute kidney injury) [facility])  Hyperkalemia    ED Discharge Orders     None          Towana Ozell BROCKS, MD 08/19/23 1045

## 2023-08-18 NOTE — H&P (Addendum)
 History and Physical    Glenda Blankenship FMW:[id number] DOB: Jun 26, 1940 DOA: 08/19/2023  PCP: Jerel Gee, NP   Patient coming from: Home   Chief Complaint:  Chief Complaint  Patient presents with   Dysuria    Pt arrived by ems d/t worsening weakness and dysuria x3 weeks. Per ems, pt has been dealing with UTI symptoms and has progressively gotten worse. Per ems, family is stating she is now altered from baseline and not acting like herself. Per ems, pt was hypotensive upon their arrival so they gave 500 ml of normal saline that brought her back to normotensive levels.   HPI:  Glenda Blankenship is a 83 y.o. 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  Patient is presenting to emergency department via EMS with complaining of generalized weakness and dysuria for 3 weeks.  EMS reported that patient has been dealing with UTI symptoms and getting progressively worse.  Per family patient is more altered as compared to the baseline and not acting like herself.  Per EMS patient found hypotensive upon arrival received 500 mL of NS bolus which improved the blood pressure. At presentation to ED patient reported that she has 1 week of diarrhea and poor appetite.  Reported minimal vomiting and cough.  Denies any fever and chills.  Progressively getting worse and unable to ambulate due to weakness.  No recent sick contact and recent travel.  During my evaluation at the bedside patient is alert and oriented to self only.  Unable to recall because of coming to the hospital.  Patient denies any abdominal pain, nausea and vomiting.  She endorsing having ongoing diarrhea for a while and reported that she is feeling very dehydrated requesting for water.Denies any chest pain, palpitation, shortness of breath or lower extremity swelling.  No other complaint at this time.  ED Course:  At presentation to ED patient is hemodynamically  stable. CMP showed elevated potassium 6.8, Elevated BUN 183, creatinine 7.01, low bicarb 13 elevated and gap 22. Pending UA. CBC unremarkable stable H&H and normal WBC platelet count. Blood cultures are in process. Elevated lipase level 525. Pending GI and C. difficile panel. Lactic acid within normal range. EKG showed normal sinus rhythm, prolonged PR interval.  Nonspecific T wave abnormality in lead III.  Chest x-ray no active disease process. CT renal stone no acute intra-abdominal abnormality. Minimal nonobstructing right nephrolithiasis. Note that renal perfusion is not well assessed on this noncontrast examination. 2. Aortic atherosclerosis.  In the ED patient has been given hyperkalemia cocktail.  Due to EKG change with hyperkalemia nephrology has been consulted recommended to start bicarb drip and nephrology Dr. Gearline will evaluate patient soon for formal consult. Also received 2 L of [place]  bolus.  Hospitalist has been consulted for further management of acute kidney injury, hyperkalemia, anion gap metabolic acidosis, acute metabolic encephalopathy and diarrhea.  Pending UA unclear if patient has underlying UTI or not.    Significant labs in the ED: Lab Orders         Gastrointestinal Panel by PCR , Stool         C Difficile Quick Screen w PCR reflex         Culture, blood (routine x 2)         Comprehensive metabolic panel         Lipase, blood         CBC with Differential  Magnesium          Urinalysis, w/ Reflex to Culture (Infection Suspected) -Urine, Clean Catch         Basic metabolic panel         Sodium, urine, random         Creatinine, urine, random         Microalbumin / creatinine urine ratio         CBC         Comprehensive metabolic panel         Ammonia         I-Stat Lactic Acid         I-stat chem 8, ED         CBG monitoring, ED       Review of Systems:  Review of Systems  Constitutional:  Negative for chills, fever, malaise/fatigue and  weight loss.  HENT:  Negative for hearing loss.   Cardiovascular:  Negative for chest pain, palpitations, orthopnea and leg swelling.  Gastrointestinal:  Positive for diarrhea. Negative for abdominal pain, blood in stool, constipation, heartburn, melena, nausea and vomiting.  Genitourinary:  Negative for dysuria, flank pain, frequency and urgency.  Musculoskeletal:  Negative for back pain, falls, joint pain, myalgias and neck pain.  Neurological:  Positive for weakness. Negative for dizziness, sensory change, speech change, focal weakness and headaches.  Psychiatric/Behavioral:  The patient is not nervous/anxious.     Past Medical History:  Diagnosis Date   Diabetes [facility])    patient denies   DVT (deep vein thrombosis) in pregnancy    Hypertension    Pneumonia    Renal disorder    Seizures (HCC) 05/2019    Past Surgical History:  Procedure Laterality Date   kidney stone removal       reports that she has quit smoking. Her smoking use included cigarettes. She started smoking about 67 years ago. She has a 33.6 pack-year smoking history. She has never used smokeless tobacco. She reports current alcohol use of about 2.0 standard drinks of alcohol per week. She reports that she does not use drugs.  No Known Allergies  History reviewed. No pertinent family history.  Prior to Admission medications   Medication Sig Start Date End Date Taking? Authorizing Provider  acetaminophen  (TYLENOL ) 325 MG tablet Take 2 tablets (650 mg total) by mouth every 6 (six) hours as needed for mild pain, fever or headache. 01/29/18   Hongalgi, Anand D, MD  amLODipine  (NORVASC ) 5 MG tablet Take 1 tablet (5 mg total) by mouth daily. 06/21/19   Lue Elsie BROCKS, MD  apixaban  (ELIQUIS ) 5 MG TABS tablet Take 1 tablet (5 mg total) by mouth 2 (two) times daily. 11/27/20   Parrett, Madelin RAMAN, NP  baclofen (LIORESAL) 10 MG tablet Take 10 mg by mouth 3 (three) times daily as needed. 06/27/19   [provider]   furosemide  (LASIX ) 20 MG tablet Take 1 tablet (20 mg total) by mouth daily. 06/10/21   Patt Alm Macho, MD  levETIRAcetam  (KEPPRA ) 500 MG tablet Take 1 tablet (500 mg total) by mouth 2 (two) times daily. 06/20/19   Lue Elsie BROCKS, MD  lisinopril -hydrochlorothiazide  (ZESTORETIC ) 20-25 MG tablet Take 1 tablet by mouth daily. 06/20/19   Lue Elsie BROCKS, MD     Physical Exam: Vitals:   08/18/23 2100 08/18/23 2130 08/18/23 2145 08/19/23 0000  BP: (!) 111/43 (!) 119/38 (!) 132/37 111/79  Pulse: (!) 116   89  Resp: 20 (!) 25 13 (!)  21  Temp:    98.4 F (36.9 C)  TempSrc:      SpO2: (!) 87%   100%  Weight:      Height:        Physical Exam Vitals and nursing note reviewed.  Constitutional:      Appearance: Normal appearance. She is ill-appearing.  HENT:     Mouth/Throat:     Mouth: Mucous membranes are dry.   Cardiovascular:     Rate and Rhythm: Normal rate and regular rhythm.     Pulses: Normal pulses.     Heart sounds: Normal heart sounds.  Pulmonary:     Effort: Pulmonary effort is normal.     Breath sounds: Normal breath sounds.  Abdominal:     Palpations: Abdomen is soft.   Musculoskeletal:     Cervical back: Neck supple.     Right lower leg: No edema.     Left lower leg: No edema.   Skin:    General: Skin is dry.     Capillary Refill: Capillary refill takes less than 2 seconds.   Neurological:     Mental Status: She is alert.     Comments: Alert and oriented to self only.  Unable to recall the event coming to the hospital.     Labs on Admission: I have personally reviewed following labs and imaging studies  CBC: Recent Labs  Lab 08/18/23 2123 08/18/23 2242  WBC 6.0  --   NEUTROABS 4.3  --   HGB 11.5* 11.2*  HCT 37.3 33.0*  MCV 88.6  --   PLT 178  --    Basic Metabolic Panel: Recent Labs  Lab 08/18/23 2123 08/18/23 2242  NA 145 144  K 6.8* 5.8*  CL 110 120*  CO2 13*  --   GLUCOSE 87 77  BUN 183* >130*  CREATININE 7.01* 6.80*   CALCIUM  9.9  --   MG 2.6*  --    GFR: Estimated Creatinine Clearance: 5.8 mL/min (A) (by C-G formula based on SCr of 6.8 mg/dL (H)). Liver Function Tests: Recent Labs  Lab 08/18/23 2123  AST 18  ALT 25  ALKPHOS 63  BILITOT 0.6  PROT 8.0  ALBUMIN 3.7   Recent Labs  Lab 08/18/23 2123  LIPASE 525*   No results for input(s): AMMONIA in the last 168 hours. Coagulation Profile: No results for input(s): INR, PROTIME in the last 168 hours. Cardiac Enzymes: Recent Labs  Lab 08/18/23 2123  TROPONINIHS 16   BNP (last 3 results) No results for input(s): BNP in the last 8760 hours. HbA1C: No results for input(s): HGBA1C in the last 72 hours. CBG: Recent Labs  Lab 08/19/23 0007  GLUCAP 201*   Lipid Profile: No results for input(s): CHOL, HDL, LDLCALC, TRIG, CHOLHDL, LDLDIRECT in the last 72 hours. Thyroid  Function Tests: No results for input(s): TSH, T4TOTAL, FREET4, T3FREE, THYROIDAB in the last 72 hours. Anemia Panel: No results for input(s): VITAMINB12, FOLATE, FERRITIN, TIBC, IRON, RETICCTPCT in the last 72 hours. Urine analysis:    Component Value Date/Time   COLORURINE YELLOW 08/19/2023 0013   APPEARANCEUR CLEAR 08/19/2023 0013   LABSPEC 1.011 08/19/2023 0013   PHURINE 5.0 08/19/2023 0013   GLUCOSEU NEGATIVE 08/19/2023 0013   HGBUR NEGATIVE 08/19/2023 0013   BILIRUBINUR NEGATIVE 08/19/2023 0013   KETONESUR NEGATIVE 08/19/2023 0013   PROTEINUR NEGATIVE 08/19/2023 0013   NITRITE NEGATIVE 08/19/2023 0013   LEUKOCYTESUR NEGATIVE 08/19/2023 0013    Radiological Exams on Admission: I have  personally reviewed images CT Renal Stone Study Result Date: 08/18/2023 CLINICAL DATA:  Renal infarction, dysuria EXAM: CT ABDOMEN AND PELVIS WITHOUT CONTRAST TECHNIQUE: Multidetector CT imaging of the abdomen and pelvis was performed following the standard protocol without IV contrast. RADIATION DOSE REDUCTION: This exam was performed  according to the departmental dose-optimization program which includes automated exposure control, adjustment of the mA and/or kV according to patient size and/or use of iterative reconstruction technique. COMPARISON:  01/08/2016 FINDINGS: Lower chest: No acute abnormality. Hepatobiliary: No focal liver abnormality is seen. No gallstones, gallbladder wall thickening, or biliary dilatation. Pancreas: Unremarkable Spleen: Unremarkable Adrenals/Urinary Tract: The adrenal glands are unremarkable. The kidneys are normal in size and position. 4 mm crescentic nonobstructing calculus is seen within the lower pole of the right kidney. The kidneys are otherwise unremarkable on this noncontrast examination. Small congenital right posterolateral bladder diverticulum. The bladder is otherwise unremarkable. Stomach/Bowel: Stomach is within normal limits. Appendix appears normal. No evidence of bowel wall thickening, distention, or inflammatory changes. Vascular/Lymphatic: Aortic atherosclerosis. No enlarged abdominal or pelvic lymph nodes. Reproductive: Multiple calcified involuted fibroids are noted within the uterus. The pelvic organs are otherwise unremarkable. Other: No abdominal wall hernia or abnormality. No abdominopelvic ascites. Musculoskeletal: No acute bone abnormality. No lytic or blastic bone lesion. Osseous structures are age appropriate. IMPRESSION: 1. No acute intra-abdominal pathology identified. Minimal nonobstructing right nephrolithiasis. Note that renal perfusion is not well assessed on this noncontrast examination. 2. Aortic atherosclerosis. Aortic Atherosclerosis (ICD10-I70.0). Electronically Signed   By: Dorethia Molt M.D.   On: 08/18/2023 23:55   DG Chest Port 1 View Result Date: 08/18/2023 CLINICAL DATA:  Cough EXAM: PORTABLE CHEST 1 VIEW COMPARISON:  01/26/2018 FINDINGS: No acute airspace disease or effusion. Stable cardiomediastinal silhouette with aortic atherosclerosis. Chronic right-sided rib  deformities. IMPRESSION: No active disease. Electronically Signed   By: Luke Bun M.D.   On: 08/18/2023 22:15     EKG: My personal interpretation of EKG shows: EKG showing sinus rhythm heart rate 79, prolonged PR interval and tall T waves.     Assessment/Plan: Principal Problem:   Acute kidney injury superimposed on CKD (HCC) Active Problems:   High anion gap metabolic acidosis   Hyperkalemia   Diarrhea   Gastroenteritis   Essential hypertension   History of seizure   History of DVT (deep vein thrombosis)   History of pulmonary embolism   Elevated lipase   Acute metabolic encephalopathy   Acute kidney injury superimposed on chronic kidney disease (HCC)    Assessment and Plan: Acute kidney injury superimposed CKD stage IIIa Hyperkalemia-secondary to AKI High anion gap italic acidosis-secondary to AKI Nonobstructing right-sided nephrolithiasis -Present emergency department via family as patient continues to have confusion and generalized weakness which has been progressively getting worse.  Patient reported she is having multiple episodes of diarrhea and 1 episode of vomiting.  However patient continues to take Lasix , amlodipine , lisinopril  and hydrochlorothiazide . EMS found hypotensive blood pressure has been improved with fluid resuscitation.  In the ED patient received 2 L of NS bolus as well. - CMP showed elevated creatinine 7 (baseline creatinine is around 1 and GFR 47), elevated potassium 6.8, low bicarb 13, elevated [place] of 22 and BUN 183.  Normal sodium, chloride level.  Elevated mag 2.6. -CBC unremarkable stable H&H. - Normal lactic acid level. - CT stone chaser no evidence of intra-abdominal abnormality.  Nonobstructing right nephrolithiasis. -Prerenal acute kidney injury in the setting of concern for gastroenteritis. -Pending UA, urine creatinine, urine sodium and  microalbumin/creatinine ratio. -In the ED patient has been given hyperkalemia cocktail. - Nephrology  has been consulted recommended to place Foley catheter and started bicarb drip. -Pending repeat BMP to follow-up with the potassium level. -Avoid nephrotoxic agents.  Renally adjust medications.  Monitor urine output. -Will place Foley catheter.  Hyperkalemia -Hyperkalemia with EKG change. - In ED patient has been given hyperkalemia cocktail.  Rechecking potassium level again and repeat EKG. -Continue bicarb drip. Continue cardiac monitoring  Gastroenteritis -Patient has ongoing diarrhea with associated vomiting for last 1 week.  Pending C. difficile and GI panel. - Continue bicarb drip.  Acute metabolic encephalopathy in the setting of AKI, hyperkalemia and dehydration from diarrhea -Family reported patient is confused and not at her baseline.  Acute metabolic encephalopathy in the setting of AKI, hyperkalemia dehydration from diarrhea.  Continue to treat for all as mentioned above.  History of unprovoked DVT and PE -Patient taking Eliquis  verified with patient's granddaughter.  Last dose of Eliquis  today morning.  Essential hypertension - Holding blood pressure regimen in the setting of hypotension and AKI.  Elevated lipase-secondary to dehydration -Elevated lipase 545.  CT abdomen pelvis no evidence of intra-abdominal commodity.  Elevated lipase in the setting of dehydration.  Seizure disorder -Verified with patient's granddaughter patient is off Keppra  since 2023.   DVT prophylaxis:  SQ Heparin  Code Status:  Full Code Diet: Heart healthy diet Family Communication: Able to reach patient's granddaughter over phone.  Unsuccessful attempt to reach patient's son. Disposition Plan: Continue monitor improvement of renal function and potassium level. Consults: Nephrology Admission status:   Inpatient, Step Down Unit  Severity of Illness: The appropriate patient status for this patient is INPATIENT. Inpatient status is judged to be reasonable and necessary in order to provide the  required intensity of service to ensure the patient's safety. The patient's presenting symptoms, physical exam findings, and initial radiographic and laboratory data in the context of their chronic comorbidities is felt to place them at high risk for further clinical deterioration. Furthermore, it is not anticipated that the patient will be medically stable for discharge from the hospital within 2 midnights of admission.   * I certify that at the point of admission it is my clinical judgment that the patient will require inpatient hospital care spanning beyond 2 midnights from the point of admission due to high intensity of service, high risk for further deterioration and high frequency of surveillance required.DEWAINE    Shareece Bultman, MD [facility]  How to contact the [facility] Attending or Consulting provider 7A - 7P or covering provider during after hours 7P -7A, for this patient.  Check the care team in [facility] and look for a) attending/consulting [facility] provider listed and b) the [facility] team listed Log into [email or web address] and use [place]'s universal password to access. If you do not have the password, please contact the hospital operator. Locate the [facility] provider you are looking for under [facility] and page to a number that you can be directly reached. If you still have difficulty reaching the provider, please page the [facility] (Director on Call) for the Hospitalists listed on amion for assistance.  08/19/2023, 12:40 AM

## 2023-08-19 ENCOUNTER — Encounter (HOSPITAL_COMMUNITY): Payer: Self-pay | Admitting: Internal Medicine

## 2023-08-19 DIAGNOSIS — Z86718 Personal history of other venous thrombosis and embolism: Secondary | ICD-10-CM

## 2023-08-19 DIAGNOSIS — N189 Chronic kidney disease, unspecified: Secondary | ICD-10-CM | POA: Diagnosis not present

## 2023-08-19 DIAGNOSIS — I10 Essential (primary) hypertension: Secondary | ICD-10-CM | POA: Diagnosis not present

## 2023-08-19 DIAGNOSIS — E8729 Other acidosis: Secondary | ICD-10-CM | POA: Diagnosis not present

## 2023-08-19 DIAGNOSIS — E860 Dehydration: Secondary | ICD-10-CM | POA: Diagnosis present

## 2023-08-19 DIAGNOSIS — R197 Diarrhea, unspecified: Secondary | ICD-10-CM

## 2023-08-19 DIAGNOSIS — E875 Hyperkalemia: Secondary | ICD-10-CM | POA: Diagnosis present

## 2023-08-19 DIAGNOSIS — G40909 Epilepsy, unspecified, not intractable, without status epilepticus: Secondary | ICD-10-CM | POA: Diagnosis present

## 2023-08-19 DIAGNOSIS — I440 Atrioventricular block, first degree: Secondary | ICD-10-CM | POA: Diagnosis present

## 2023-08-19 DIAGNOSIS — I959 Hypotension, unspecified: Secondary | ICD-10-CM | POA: Diagnosis present

## 2023-08-19 DIAGNOSIS — Z87898 Personal history of other specified conditions: Secondary | ICD-10-CM | POA: Diagnosis not present

## 2023-08-19 DIAGNOSIS — N179 Acute kidney failure, unspecified: Secondary | ICD-10-CM | POA: Diagnosis present

## 2023-08-19 DIAGNOSIS — Z87442 Personal history of urinary calculi: Secondary | ICD-10-CM | POA: Diagnosis not present

## 2023-08-19 DIAGNOSIS — Z86711 Personal history of pulmonary embolism: Secondary | ICD-10-CM | POA: Diagnosis not present

## 2023-08-19 DIAGNOSIS — N1831 Chronic kidney disease, stage 3a: Secondary | ICD-10-CM | POA: Diagnosis present

## 2023-08-19 DIAGNOSIS — Z7901 Long term (current) use of anticoagulants: Secondary | ICD-10-CM | POA: Diagnosis not present

## 2023-08-19 DIAGNOSIS — E872 Acidosis, unspecified: Secondary | ICD-10-CM | POA: Diagnosis present

## 2023-08-19 DIAGNOSIS — E1122 Type 2 diabetes mellitus with diabetic chronic kidney disease: Secondary | ICD-10-CM | POA: Diagnosis present

## 2023-08-19 DIAGNOSIS — Z87891 Personal history of nicotine dependence: Secondary | ICD-10-CM | POA: Diagnosis not present

## 2023-08-19 DIAGNOSIS — R5381 Other malaise: Secondary | ICD-10-CM | POA: Diagnosis present

## 2023-08-19 DIAGNOSIS — Z79899 Other long term (current) drug therapy: Secondary | ICD-10-CM | POA: Diagnosis not present

## 2023-08-19 DIAGNOSIS — I700 Atherosclerosis of aorta: Secondary | ICD-10-CM | POA: Diagnosis present

## 2023-08-19 DIAGNOSIS — K529 Noninfective gastroenteritis and colitis, unspecified: Secondary | ICD-10-CM | POA: Diagnosis present

## 2023-08-19 DIAGNOSIS — R748 Abnormal levels of other serum enzymes: Secondary | ICD-10-CM | POA: Insufficient documentation

## 2023-08-19 DIAGNOSIS — M109 Gout, unspecified: Secondary | ICD-10-CM | POA: Diagnosis present

## 2023-08-19 DIAGNOSIS — G9341 Metabolic encephalopathy: Secondary | ICD-10-CM | POA: Diagnosis present

## 2023-08-19 DIAGNOSIS — I129 Hypertensive chronic kidney disease with stage 1 through stage 4 chronic kidney disease, or unspecified chronic kidney disease: Secondary | ICD-10-CM | POA: Diagnosis present

## 2023-08-19 DIAGNOSIS — E870 Hyperosmolality and hypernatremia: Secondary | ICD-10-CM | POA: Diagnosis present

## 2023-08-19 DIAGNOSIS — D631 Anemia in chronic kidney disease: Secondary | ICD-10-CM | POA: Diagnosis present

## 2023-08-19 LAB — COMPREHENSIVE METABOLIC PANEL WITH GFR
ALT: 20 U/L (ref 0–44)
AST: 17 U/L (ref 15–41)
Albumin: 2.9 g/dL — ABNORMAL LOW (ref 3.5–5.0)
Alkaline Phosphatase: 50 U/L (ref 38–126)
Anion gap: 16 — ABNORMAL HIGH (ref 5–15)
BUN: 161 mg/dL — ABNORMAL HIGH (ref 8–23)
CO2: 14 mmol/L — ABNORMAL LOW (ref 22–32)
Calcium: 8.8 mg/dL — ABNORMAL LOW (ref 8.9–10.3)
Chloride: 115 mmol/L — ABNORMAL HIGH (ref 98–111)
Creatinine, Ser: 5.3 mg/dL — ABNORMAL HIGH (ref 0.44–1.00)
GFR, Estimated: 8 mL/min — ABNORMAL LOW (ref >60)
Glucose, Bld: 105 mg/dL — ABNORMAL HIGH (ref 70–99)
Potassium: 4.9 mmol/L (ref 3.5–5.1)
Sodium: 145 mmol/L (ref 135–145)
Total Bilirubin: 0.5 mg/dL (ref 0.0–1.2)
Total Protein: 6.5 g/dL (ref 6.5–8.1)

## 2023-08-19 LAB — CBC
HCT: 31.2 % — ABNORMAL LOW (ref 36.0–46.0)
Hemoglobin: 9.7 g/dL — ABNORMAL LOW (ref 12.0–15.0)
MCH: 27.5 pg (ref 26.0–34.0)
MCHC: 31.1 g/dL (ref 30.0–36.0)
MCV: 88.4 fL (ref 80.0–100.0)
Platelets: 154 10*3/uL (ref 150–400)
RBC: 3.53 MIL/uL — ABNORMAL LOW (ref 3.87–5.11)
RDW: 15.1 % (ref 11.5–15.5)
WBC: 5.3 10*3/uL (ref 4.0–10.5)
nRBC: 0 % (ref 0.0–0.2)

## 2023-08-19 LAB — URINALYSIS, W/ REFLEX TO CULTURE (INFECTION SUSPECTED)
Bilirubin Urine: NEGATIVE
Glucose, UA: NEGATIVE mg/dL
Hgb urine dipstick: NEGATIVE
Ketones, ur: NEGATIVE mg/dL
Leukocytes,Ua: NEGATIVE
Nitrite: NEGATIVE
Protein, ur: NEGATIVE mg/dL
Specific Gravity, Urine: 1.011 (ref 1.005–1.030)
pH: 5 (ref 5.0–8.0)

## 2023-08-19 LAB — BASIC METABOLIC PANEL WITH GFR
Anion gap: 16 — ABNORMAL HIGH (ref 5–15)
BUN: 172 mg/dL — ABNORMAL HIGH (ref 8–23)
CO2: 13 mmol/L — ABNORMAL LOW (ref 22–32)
Calcium: 9 mg/dL (ref 8.9–10.3)
Chloride: 114 mmol/L — ABNORMAL HIGH (ref 98–111)
Creatinine, Ser: 6.04 mg/dL — ABNORMAL HIGH (ref 0.44–1.00)
GFR, Estimated: 6 mL/min — ABNORMAL LOW (ref >60)
Glucose, Bld: 134 mg/dL — ABNORMAL HIGH (ref 70–99)
Potassium: 5 mmol/L (ref 3.5–5.1)
Sodium: 143 mmol/L (ref 135–145)

## 2023-08-19 LAB — AMMONIA: Ammonia: 64 umol/L — ABNORMAL HIGH (ref 9–35)

## 2023-08-19 LAB — CBG MONITORING, ED: Glucose-Capillary: 201 mg/dL — ABNORMAL HIGH (ref 70–99)

## 2023-08-19 LAB — SODIUM, URINE, RANDOM: Sodium, Ur: 61 mmol/L

## 2023-08-19 LAB — I-STAT CG4 LACTIC ACID, ED: Lactic Acid, Venous: 0.9 mmol/L (ref 0.5–1.9)

## 2023-08-19 LAB — CREATININE, URINE, RANDOM: Creatinine, Urine: 114 mg/dL

## 2023-08-19 MED ORDER — SODIUM BICARBONATE 8.4 % IV SOLN
INTRAVENOUS | Status: AC
  Filled 2023-08-19 (×3): qty 1000

## 2023-08-19 MED ORDER — ACETAMINOPHEN 650 MG RE SUPP: 650.0000 mg | Freq: Four times a day (QID) | RECTAL | Status: DC | PRN

## 2023-08-19 MED ORDER — APIXABAN 5 MG PO TABS
5.0000 mg | ORAL_TABLET | Freq: Two times a day (BID) | ORAL | Status: DC
  Administered 2023-08-19 – 2023-08-23 (×9): 5 mg via ORAL
  Filled 2023-08-19 (×9): qty 1

## 2023-08-19 MED ORDER — LEVETIRACETAM 500 MG PO TABS: 500.0000 mg | ORAL_TABLET | Freq: Two times a day (BID) | ORAL | Status: DC

## 2023-08-19 MED ORDER — CHLORHEXIDINE GLUCONATE CLOTH 2 % EX PADS
6.0000 | MEDICATED_PAD | Freq: Every day | CUTANEOUS | Status: DC
  Administered 2023-08-20 – 2023-08-23 (×3): 6 via TOPICAL

## 2023-08-19 MED ORDER — HEPARIN SODIUM (PORCINE) 5000 UNIT/ML IJ SOLN: 5000.0000 [IU] | Freq: Three times a day (TID) | INTRAMUSCULAR | Status: DC

## 2023-08-19 MED ORDER — CHLORHEXIDINE GLUCONATE CLOTH 2 % EX PADS
6.0000 | MEDICATED_PAD | Freq: Every day | CUTANEOUS | Status: DC
  Administered 2023-08-22 – 2023-08-23 (×2): 6 via TOPICAL

## 2023-08-19 MED ORDER — TRIMETHOBENZAMIDE HCL 100 MG/ML IM SOLN
200.0000 mg | Freq: Four times a day (QID) | INTRAMUSCULAR | Status: DC | PRN
  Filled 2023-08-19: qty 2

## 2023-08-19 MED ORDER — SODIUM CHLORIDE 0.9% FLUSH
3.0000 mL | Freq: Two times a day (BID) | INTRAVENOUS | Status: DC
  Administered 2023-08-19 – 2023-08-23 (×8): 3 mL via INTRAVENOUS

## 2023-08-19 MED ORDER — SODIUM CHLORIDE 0.9 % IV SOLN: 250.0000 mL | INTRAVENOUS | Status: AC | PRN

## 2023-08-19 MED ORDER — BISMUTH SUBSALICYLATE 262 MG/15ML PO SUSP: 30.0000 mL | ORAL | Status: DC | PRN

## 2023-08-19 MED ORDER — SODIUM CHLORIDE 0.9% FLUSH
3.0000 mL | Freq: Two times a day (BID) | INTRAVENOUS | Status: DC
  Administered 2023-08-19 – 2023-08-23 (×10): 3 mL via INTRAVENOUS

## 2023-08-19 MED ORDER — ACETAMINOPHEN 325 MG PO TABS: 650.0000 mg | ORAL_TABLET | Freq: Four times a day (QID) | ORAL | Status: DC | PRN

## 2023-08-19 MED ORDER — SODIUM CHLORIDE 0.9% FLUSH: 3.0000 mL | INTRAVENOUS | Status: DC | PRN

## 2023-08-19 NOTE — Progress Notes (Signed)
 [facility] PROGRESS NOTE    Progress Note  Glenda Blankenship  FMW:[id number] DOB: 21-Apr-1940 DOA: 08/18/2023 PCP: Jerel Gee, NP     Brief Narrative:   Glenda Blankenship is an 83 y.o. 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  CT scan of the abdomen pelvis showed no acute findings, with nonobstructive right nephrolithiasis  Assessment/Plan:   Acute kidney injury superimposed on CKD (HCC)/hyperkalemia/high anion gap metabolic acidosis: Patient reports decreased oral intake with multiple episodes of vomiting and diarrhea.  She continues take her antihypertensive medication through this. Baseline creatinine is around 1, on admission was 7, potassium of 5.8. Most likely this is likely hemodynamically mediated in the setting of ongoing use of diuretics and antihypertensive medication. Foley cath was placed she was started on bicarbonate drip. Her potassium has improved creatinine is trending down. Will continue to follow strict I's and O's and daily weights. Continue to hold ARB.  Hyperkalemia: Restarted on IV fluids and improved.  Gastroenteritis: She has not been on antibiotics, C. difficile panel is negative.  Acute metabolic encephalopathy: Question due to dehydration.  History of unprovoked DVT and PE: Continue Eliquis .  Essential hypertension: Will allow permissive hypertension continue to hold antihypertensive medication and diuretics.  History of seizure disorder: Patient has been off Keppra  since 2023.   DVT prophylaxis: lovenox Family Communication:none Status is: Inpatient Remains inpatient appropriate because: Acute kidney injury    Code Status:     Code Status Orders  (From admission, onward)           Start     Ordered   08/19/23 0017  Full code  Continuous        Question:  By:  Answer:  Consent: discussion documented in EHR   08/19/23 0017           Code Status History     Date Active Date Inactive Code Status Order ID Comments User Context   06/18/2019 1134 06/20/2019 1728 Full Code [id number]  Sim Emery CROME, MD ED   01/27/2018 0055 01/29/2018 1906 DNR [id number]  Alfornia Madison, MD ED   01/08/2016 0213 01/14/2016 1732 DNR [id number]  Rosan Deward ORN, NP ED         IV Access:   Peripheral IV   Procedures and diagnostic studies:   CT Renal Stone Study Result Date: 08/18/2023 CLINICAL DATA:  Renal infarction, dysuria EXAM: CT ABDOMEN AND PELVIS WITHOUT CONTRAST TECHNIQUE: Multidetector CT imaging of the abdomen and pelvis was performed following the standard protocol without IV contrast. RADIATION DOSE REDUCTION: This exam was performed according to the departmental dose-optimization program which includes automated exposure control, adjustment of the mA and/or kV according to patient size and/or use of iterative reconstruction technique. COMPARISON:  01/08/2016 FINDINGS: Lower chest: No acute abnormality. Hepatobiliary: No focal liver abnormality is seen. No gallstones, gallbladder wall thickening, or biliary dilatation. Pancreas: Unremarkable Spleen: Unremarkable Adrenals/Urinary Tract: The adrenal glands are unremarkable. The kidneys are normal in size and position. 4 mm crescentic nonobstructing calculus is seen within the lower pole of the right kidney. The kidneys are otherwise unremarkable on this noncontrast examination. Small congenital right posterolateral bladder diverticulum. The bladder is otherwise unremarkable. Stomach/Bowel: Stomach is within normal limits. Appendix appears normal. No evidence of bowel wall thickening, distention, or inflammatory changes. Vascular/Lymphatic: Aortic atherosclerosis. No enlarged abdominal or pelvic lymph  nodes. Reproductive: Multiple calcified involuted fibroids are noted within the uterus. The  pelvic organs are otherwise unremarkable. Other: No abdominal wall hernia or abnormality. No abdominopelvic ascites. Musculoskeletal: No acute bone abnormality. No lytic or blastic bone lesion. Osseous structures are age appropriate. IMPRESSION: 1. No acute intra-abdominal pathology identified. Minimal nonobstructing right nephrolithiasis. Note that renal perfusion is not well assessed on this noncontrast examination. 2. Aortic atherosclerosis. Aortic Atherosclerosis (ICD10-I70.0). Electronically Signed   By: Dorethia Molt M.D.   On: 08/18/2023 23:55   DG Chest Port 1 View Result Date: 08/18/2023 CLINICAL DATA:  Cough EXAM: PORTABLE CHEST 1 VIEW COMPARISON:  01/26/2018 FINDINGS: No acute airspace disease or effusion. Stable cardiomediastinal silhouette with aortic atherosclerosis. Chronic right-sided rib deformities. IMPRESSION: No active disease. Electronically Signed   By: Luke Bun M.D.   On: 08/18/2023 22:15     Medical Consultants:   None.   Subjective:    Glenda Blankenship is good no complaints.  Objective:    Vitals:   08/18/23 2130 08/18/23 2145 08/19/23 0000 08/19/23 0445  BP: (!) 119/38 (!) 132/37 111/79 (!) 112/38  Pulse:   89 83  Resp: (!) 25 13 (!) 21 16  Temp:   98.4 F (36.9 C)   TempSrc:      SpO2:   100% 100%  Weight:      Height:       SpO2: 100 %   Intake/Output Summary (Last 24 hours) at 08/19/2023 0636 Last data filed at 08/19/2023 0033 Gross per 24 hour  Intake 6 ml  Output --  Net 6 ml   Filed Weights   08/18/23 2031  Weight: 72.6 kg    Exam: General exam: In no acute distress. Respiratory system: Good air movement and clear to auscultation. Cardiovascular system: S1 & S2 heard, RRR. No JVD.SABRA  Gastrointestinal system: Abdomen is nondistended, soft and nontender.  Central nervous system: Alert and oriented x 3. Extremities: No pedal edema. Skin: No rashes, lesions or ulcers Psychiatry: Judgement and insight appear normal. Mood & affect  appropriate.    Data Reviewed:    Labs: Basic Metabolic Panel: Recent Labs  Lab 08/18/23 0010 08/18/23 2123 08/18/23 2242 08/19/23 0455  NA 143 145 144 145  K 5.0 6.8* 5.8* 4.9  CL 114* 110 120* 115*  CO2 13* 13*  --  14*  GLUCOSE 134* 87 77 105*  BUN 172* 183* >130* 161*  CREATININE 6.04* 7.01* 6.80* 5.30*  CALCIUM  9.0 9.9  --  8.8*  MG  --  2.6*  --   --    GFR Estimated Creatinine Clearance: 7.5 mL/min (A) (by C-G formula based on SCr of 5.3 mg/dL (H)). Liver Function Tests: Recent Labs  Lab 08/18/23 2123 08/19/23 0455  AST 18 17  ALT 25 20  ALKPHOS 63 50  BILITOT 0.6 0.5  PROT 8.0 6.5  ALBUMIN 3.7 2.9*   Recent Labs  Lab 08/18/23 2123  LIPASE 525*   Recent Labs  Lab 08/19/23 0455  AMMONIA 64*   Coagulation profile No results for input(s): INR, PROTIME in the last 168 hours. COVID-19 Labs  No results for input(s): DDIMER, FERRITIN, LDH, CRP in the last 72 hours.  Lab Results  Component Value Date   SARSCOV2NAA NEGATIVE 06/18/2019    CBC: Recent Labs  Lab 08/18/23 2123 08/18/23 2242 08/19/23 0455  WBC 6.0  --  5.3  NEUTROABS 4.3  --   --   HGB 11.5* 11.2* 9.7*  HCT 37.3 33.0* 31.2*  MCV 88.6  --  88.4  PLT 178  --  154   Cardiac Enzymes: No results for input(s): CKTOTAL, CKMB, CKMBINDEX, TROPONINI in the last 168 hours. BNP (last 3 results) No results for input(s): PROBNP in the last 8760 hours. CBG: Recent Labs  Lab 08/19/23 0007  GLUCAP 201*   D-Dimer: No results for input(s): DDIMER in the last 72 hours. Hgb A1c: No results for input(s): HGBA1C in the last 72 hours. Lipid Profile: No results for input(s): CHOL, HDL, LDLCALC, TRIG, CHOLHDL, LDLDIRECT in the last 72 hours. Thyroid  function studies: No results for input(s): TSH, T4TOTAL, T3FREE, THYROIDAB in the last 72 hours.  Invalid input(s): FREET3 Anemia work up: No results for input(s): VITAMINB12, FOLATE,  FERRITIN, TIBC, IRON, RETICCTPCT in the last 72 hours. Sepsis Labs: Recent Labs  Lab 08/18/23 2123 08/18/23 2130 08/19/23 0015 08/19/23 0455  WBC 6.0  --   --  5.3  LATICACIDVEN  --  0.8 0.9  --    Microbiology Recent Results (from the past 240 hours)  Culture, blood (routine x 2)     Status: None (Preliminary result)   Collection Time: 08/18/23  9:23 PM   Specimen: BLOOD  Result Value Ref Range Status   Specimen Description BLOOD RIGHT ANTECUBITAL  Final   Special Requests   Final    BOTTLES DRAWN AEROBIC AND ANAEROBIC Blood Culture adequate volume Performed at [facility], [address] [place], [place] [address]    Culture PENDING  Incomplete   Report Status PENDING  Incomplete     Medications:    apixaban   5 mg Oral BID   Chlorhexidine  Gluconate Cloth  6 each Topical Daily   Chlorhexidine  Gluconate Cloth  6 each Topical Daily   sodium chloride  flush  3 mL Intravenous Q12H   sodium chloride  flush  3 mL Intravenous Q12H   Continuous Infusions:  sodium chloride      sodium bicarbonate  150 mEq in dextrose  5 % 1,150 mL infusion 100 mL/hr at 08/19/23 0011      LOS: 0 days   Erle Odell Castor  [facility]  08/19/2023, 6:36 AM

## 2023-08-19 NOTE — Consult Note (Signed)
 [facility] Renal Consultation Note  Requesting MD: Ruddy Castor Indication for Consultation: AKI  HPI:  Glenda Blankenship is a 83 y.o. female with past medical history significant for HTN ( on benicar, torsemide) , DVT/PE on eliquis , reported sz in past and gout.  There is not a lot of clinical information available in the system.  Crt noted in April of 23 to be 1.16.  She presented to ER last night with sxms of UTI, diarrhea  and AMS -  was hypotensive-  crt found to be 6, then 7 with a K of 6.8-  treated medically overnight with hyperk meds and fluids-  BP is better-  crt down to 5.3 this AM with K of 4.9.  Interestingly urine is not remarkable-  no blood or protein -  ultrasound showed non obstructing stone-  making good urine.  She is slightly confused still when I see her-  she is not sure if still having diarrhea or dysuria.  She was able to tell me that she follows at [facility] however  Creatinine, Ser  Date/Time Value Ref Range Status  08/19/2023 04:55 AM 5.30 (H) 0.44 - 1.00 mg/dL Final  93/79/7974 89:57 PM 6.80 (H) 0.44 - 1.00 mg/dL Final  93/79/7974 90:76 PM 7.01 (H) 0.44 - 1.00 mg/dL Final  93/79/7974 87:89 AM 6.04 (H) 0.44 - 1.00 mg/dL Final  95/86/7976 90:89 PM 1.16 (H) 0.44 - 1.00 mg/dL Final  95/78/7978 95:90 AM 0.96 0.44 - 1.00 mg/dL Final  95/79/7978 88:41 AM 0.97 0.44 - 1.00 mg/dL Final  95/80/7978 93:65 PM 0.80 0.44 - 1.00 mg/dL Final  95/80/7978 93:74 PM 0.84 0.44 - 1.00 mg/dL Final  87/76/7980 93:57 PM 0.96 0.44 - 1.00 mg/dL Final  87/98/7980 96:87 AM 0.94 0.44 - 1.00 mg/dL Final  88/70/7980 91:43 PM 1.28 (H) 0.44 - 1.00 mg/dL Final  90/93/7980 97:59 PM 1.15 (H) 0.44 - 1.00 mg/dL Final  89/81/7981 88:70 AM 1.12 0.40 - 1.20 mg/dL Final  93/81/7981 88:87 AM 0.87 0.40 - 1.20 mg/dL Final  88/83/7982 95:46 AM 0.90 0.44 - 1.00 mg/dL Final  88/85/7982 96:73 AM 1.01 (H) 0.44 - 1.00 mg/dL Final  88/86/7982 95:86 AM 1.25 (H) 0.44 - 1.00 mg/dL Final   88/87/7982 94:99 AM 1.28 (H) 0.44 - 1.00 mg/dL Final  88/88/7982 95:63 AM 1.82 (H) 0.44 - 1.00 mg/dL Final  88/89/7982 89:59 AM 2.38 (H) 0.44 - 1.00 mg/dL Final  88/89/7982 94:54 AM 2.32 (H) 0.44 - 1.00 mg/dL Final  88/90/7982 89:69 PM 2.32 (H) 0.44 - 1.00 mg/dL Final  93/85/7988 95:71 AM 1.13 0.4 - 1.2 mg/dL Final  93/97/7988 92:49 AM 1.05 0.4 - 1.2 mg/dL Final     PMHx:   Past Medical History:  Diagnosis Date   Diabetes (HCC)    patient denies   DVT (deep vein thrombosis) in pregnancy    Hypertension    Pneumonia    Renal disorder    Seizures (HCC) 05/2019    Past Surgical History:  Procedure Laterality Date   kidney stone removal      Family Hx: History reviewed. No pertinent family history.  Social History:  reports that she has quit smoking. Her smoking use included cigarettes. She started smoking about 67 years ago. She has a 33.6 pack-year smoking history. She has never used smokeless tobacco. She reports current alcohol use of about 2.0 standard drinks of alcohol per week. She reports that she does not use drugs.  Allergies: No Known Allergies  Medications: Prior to  Admission medications   Medication Sig Start Date End Date Taking? Authorizing Provider  allopurinol  (ZYLOPRIM ) 100 MG tablet Take 100 mg by mouth daily. 07/06/23  Yes [provider]  apixaban  (ELIQUIS ) 5 MG TABS tablet Take 1 tablet (5 mg total) by mouth 2 (two) times daily. 11/27/20  Yes Parrett, Tammy S, NP  Multiple Vitamins-Minerals (MULTIVITAMIN WOMEN 50+) TABS Take 1 tablet by mouth daily.   Yes [provider]  olmesartan (BENICAR) 40 MG tablet Take 40 mg by mouth daily. 06/22/23  Yes [provider]  torsemide (DEMADEX) 10 MG tablet Take 10 mg by mouth daily. 06/22/23  Yes [provider]    I have reviewed the patient's current medications.  Labs:  Results for orders placed or performed during the hospital encounter of 08/18/23 (from the past 48 hours)   Basic metabolic panel     Status: Abnormal   Collection Time: 08/18/23 12:10 AM  Result Value Ref Range   Sodium 143 135 - 145 mmol/L   Potassium 5.0 3.5 - 5.1 mmol/L   Chloride 114 (H) 98 - 111 mmol/L   CO2 13 (L) 22 - 32 mmol/L   Glucose, Bld 134 (H) 70 - 99 mg/dL    Comment: Glucose reference range applies only to samples taken after fasting for at least 8 hours.   BUN 172 (H) 8 - 23 mg/dL   Creatinine, Ser 3.95 (H) 0.44 - 1.00 mg/dL   Calcium  9.0 8.9 - 10.3 mg/dL   GFR, Estimated 6 (L) >60 mL/min    Comment: (NOTE) Calculated using the CKD-EPI Creatinine Equation (2021)    Anion gap 16 (H) 5 - 15    Comment: Performed at [facility], [address], [place], [place] [address]  Comprehensive metabolic panel     Status: Abnormal   Collection Time: 08/18/23  9:23 PM  Result Value Ref Range   Sodium 145 135 - 145 mmol/L   Potassium 6.8 (HH) 3.5 - 5.1 mmol/L    Comment: CRITICAL RESULT CALLED TO, READ BACK BY AND VERIFIED WITH S PLUCKETT RN 08/18/2023 2225 BNUNNERY   Chloride 110 98 - 111 mmol/L   CO2 13 (L) 22 - 32 mmol/L   Glucose, Bld 87 70 - 99 mg/dL    Comment: Glucose reference range applies only to samples taken after fasting for at least 8 hours.   BUN 183 (H) 8 - 23 mg/dL   Creatinine, Ser 2.98 (H) 0.44 - 1.00 mg/dL   Calcium  9.9 8.9 - 10.3 mg/dL   Total Protein 8.0 6.5 - 8.1 g/dL   Albumin 3.7 3.5 - 5.0 g/dL   AST 18 15 - 41 U/L   ALT 25 0 - 44 U/L   Alkaline Phosphatase 63 38 - 126 U/L   Total Bilirubin 0.6 0.0 - 1.2 mg/dL   GFR, Estimated 5 (L) >60 mL/min    Comment: (NOTE) Calculated using the CKD-EPI Creatinine Equation (2021)    Anion gap 22 (H) 5 - 15    Comment: ELECTROLYTES REPEATED TO VERIFY Performed at [facility], [address], [place], [place] [address]   Lipase, blood     Status: Abnormal   Collection Time: 08/18/23  9:23 PM  Result Value Ref Range   Lipase 525 (H) 11 - 51 U/L    Comment: RESULTS CONFIRMED BY MANUAL  DILUTION Performed at [facility], [address] [place], [place] [address]   Troponin I (High Sensitivity)  Status: None   Collection Time: 08/18/23  9:23 PM  Result Value Ref Range   Troponin I (High Sensitivity) 16 <18 ng/L    Comment: (NOTE) Elevated high sensitivity troponin I (hsTnI) values and significant  changes across serial measurements may suggest ACS but many other  chronic and acute conditions are known to elevate hsTnI results.  Refer to the Links section for chest pain algorithms and additional  guidance. Performed at [facility], [address] [place], [place] [address]   CBC with Differential     Status: Abnormal   Collection Time: 08/18/23  9:23 PM  Result Value Ref Range   WBC 6.0 4.0 - 10.5 K/uL   RBC 4.21 3.87 - 5.11 MIL/uL   Hemoglobin 11.5 (L) 12.0 - 15.0 g/dL   HCT 62.6 63.9 - 53.9 %   MCV 88.6 80.0 - 100.0 fL   MCH 27.3 26.0 - 34.0 pg   MCHC 30.8 30.0 - 36.0 g/dL   RDW 84.7 88.4 - 84.4 %   Platelets 178 150 - 400 K/uL   nRBC 0.0 0.0 - 0.2 %   Neutrophils Relative % 72 %   Neutro Abs 4.3 1.7 - 7.7 K/uL   Lymphocytes Relative 21 %   Lymphs Abs 1.2 0.7 - 4.0 K/uL   Monocytes Relative 6 %   Monocytes Absolute 0.4 0.1 - 1.0 K/uL   Eosinophils Relative 1 %   Eosinophils Absolute 0.1 0.0 - 0.5 K/uL   Basophils Relative 0 %   Basophils Absolute 0.0 0.0 - 0.1 K/uL   Immature Granulocytes 0 %   Abs Immature Granulocytes 0.01 0.00 - 0.07 K/uL    Comment: Performed at [facility], [address], [place], [place] [address]  Magnesium      Status: Abnormal   Collection Time: 08/18/23  9:23 PM  Result Value Ref Range   Magnesium  2.6 (H) 1.7 - 2.4 mg/dL    Comment: Performed at [facility], [address], [place], [place] [address]  Culture, blood (routine x 2)     Status: None (Preliminary result)   Collection Time: 08/18/23  9:23 PM   Specimen: BLOOD  Result Value Ref Range   Specimen Description BLOOD RIGHT  ANTECUBITAL    Special Requests      BOTTLES DRAWN AEROBIC AND ANAEROBIC Blood Culture adequate volume   Culture      NO GROWTH < 12 HOURS Performed at [facility], [address], [place], [place] [address]    Report Status PENDING   I-Stat Lactic Acid     Status: None   Collection Time: 08/18/23  9:30 PM  Result Value Ref Range   Lactic Acid, Venous 0.8 0.5 - 1.9 mmol/L  Culture, blood (routine x 2)     Status: None (Preliminary result)   Collection Time: 08/18/23  9:43 PM   Specimen: BLOOD  Result Value Ref Range   Specimen Description BLOOD LEFT ANTECUBITAL    Special Requests      BOTTLES DRAWN AEROBIC AND ANAEROBIC Blood Culture adequate volume   Culture      NO GROWTH < 12 HOURS Performed at [facility], [address] [place], [place] [address]    Report Status PENDING   I-stat chem 8, ED     Status: Abnormal   Collection Time: 08/18/23 10:42 PM  Result Value Ref Range   Sodium 144 135 - 145 mmol/L   Potassium 5.8 (H) 3.5 -  5.1 mmol/L   Chloride 120 (H) 98 - 111 mmol/L   BUN >130 (H) 8 - 23 mg/dL   Creatinine, Ser 3.19 (H) 0.44 - 1.00 mg/dL   Glucose, Bld 77 70 - 99 mg/dL    Comment: Glucose reference range applies only to samples taken after fasting for at least 8 hours.   Calcium , Ion 1.19 1.15 - 1.40 mmol/L   TCO2 14 (L) 22 - 32 mmol/L   Hemoglobin 11.2 (L) 12.0 - 15.0 g/dL   HCT 66.9 (L) 63.9 - 53.9 %  CBG monitoring, ED     Status: Abnormal   Collection Time: 08/19/23 12:07 AM  Result Value Ref Range   Glucose-Capillary 201 (H) 70 - 99 mg/dL    Comment: Glucose reference range applies only to samples taken after fasting for at least 8 hours.  Urinalysis, w/ Reflex to Culture (Infection Suspected) -Urine, Clean Catch     Status: Abnormal   Collection Time: 08/19/23 12:13 AM  Result Value Ref Range   Specimen Source URINE, CLEAN CATCH    Color, Urine YELLOW YELLOW   APPearance CLEAR CLEAR   Specific Gravity, Urine 1.011 1.005 - 1.030   pH  5.0 5.0 - 8.0   Glucose, UA NEGATIVE NEGATIVE mg/dL   Hgb urine dipstick NEGATIVE NEGATIVE   Bilirubin Urine NEGATIVE NEGATIVE   Ketones, ur NEGATIVE NEGATIVE mg/dL   Protein, ur NEGATIVE NEGATIVE mg/dL   Nitrite NEGATIVE NEGATIVE   Leukocytes,Ua NEGATIVE NEGATIVE   RBC / HPF 0-5 0 - 5 RBC/hpf   WBC, UA 0-5 0 - 5 WBC/hpf    Comment:        Reflex urine culture not performed if WBC <=10, OR if Squamous epithelial cells >5. If Squamous epithelial cells >5 suggest recollection.    Bacteria, UA RARE (A) NONE SEEN   Squamous Epithelial / HPF 0-5 0 - 5 /HPF   Mucus PRESENT     Comment: Performed at [facility], [address] [place], [place] [address]  I-Stat Lactic Acid     Status: None   Collection Time: 08/19/23 12:15 AM  Result Value Ref Range   Lactic Acid, Venous 0.9 0.5 - 1.9 mmol/L  Sodium, urine, random     Status: None   Collection Time: 08/19/23 12:36 AM  Result Value Ref Range   Sodium, Ur 61 mmol/L    Comment: Performed at [facility], [address] [place], [place] [address]  Creatinine, urine, random     Status: None   Collection Time: 08/19/23 12:36 AM  Result Value Ref Range   Creatinine, Urine 114 mg/dL    Comment: Performed at [facility], [address] [place], [place] [address]  CBC     Status: Abnormal   Collection Time: 08/19/23  4:55 AM  Result Value Ref Range   WBC 5.3 4.0 - 10.5 K/uL   RBC 3.53 (L) 3.87 - 5.11 MIL/uL   Hemoglobin 9.7 (L) 12.0 - 15.0 g/dL   HCT 68.7 (L) 63.9 - 53.9 %   MCV 88.4 80.0 - 100.0 fL   MCH 27.5 26.0 - 34.0 pg   MCHC 31.1 30.0 - 36.0 g/dL   RDW 84.8 88.4 - 84.4 %   Platelets 154 150 - 400 K/uL   nRBC 0.0 0.0 - 0.2 %    Comment: Performed at [facility], [address] [place], [place] [address]  Comprehensive metabolic panel     Status: Abnormal  Collection Time: 08/19/23  4:55 AM  Result Value Ref Range   Sodium 145 135 - 145 mmol/L   Potassium 4.9 3.5 - 5.1 mmol/L   Chloride 115 (H)  98 - 111 mmol/L   CO2 14 (L) 22 - 32 mmol/L   Glucose, Bld 105 (H) 70 - 99 mg/dL    Comment: Glucose reference range applies only to samples taken after fasting for at least 8 hours.   BUN 161 (H) 8 - 23 mg/dL   Creatinine, Ser 4.69 (H) 0.44 - 1.00 mg/dL   Calcium  8.8 (L) 8.9 - 10.3 mg/dL   Total Protein 6.5 6.5 - 8.1 g/dL   Albumin 2.9 (L) 3.5 - 5.0 g/dL   AST 17 15 - 41 U/L   ALT 20 0 - 44 U/L   Alkaline Phosphatase 50 38 - 126 U/L   Total Bilirubin 0.5 0.0 - 1.2 mg/dL   GFR, Estimated 8 (L) >60 mL/min    Comment: (NOTE) Calculated using the CKD-EPI Creatinine Equation (2021)    Anion gap 16 (H) 5 - 15    Comment: Performed at [facility] Lab, [address] [place], [place] [address]  Ammonia     Status: Abnormal   Collection Time: 08/19/23  4:55 AM  Result Value Ref Range   Ammonia 64 (H) 9 - 35 umol/L    Comment: Performed at [facility], [address], [place], [place] [address]     ROS:  Review of systems not obtained due to patient factors.  Physical Exam: Vitals:   08/19/23 1130 08/19/23 1321  BP: (!) 112/47   Pulse: 74   Resp: 14   Temp:  98.2 F (36.8 C)  SpO2: 100%      General: thin , slightly confused elderly black female -  NAD HEENT:  PERRLA, EOMI, mucous membranes moist  Neck: no JVD Heart: RRR Lungs: mostly clear Abdomen: soft, non tender Extremities: no edema  Skin: warm and dry Neuro: pleasant, conversational-   could not answer direct questions regarding her symptoms  Assessment/Plan: 83 year old BF presenting with AKI in the setting of hypotension while remaining on BP meds- also kidney stone 1.Renal- A on CRF-  not entirely sure what baseline is-  crt was 1.1 but in 2023.  Now with A on CRF in the setting of hypotension on ARB and diuretic.  Renal function has already improved after holding BP meds and giving hydration.  Urine is pretty bland.  Is not c/o any urinary sxms at present.  Imaging shows nonobstructing stone-  likely  no action needed at this time.  NO indications for dialysis either now that K is down  2. Hypertension/volume  - was hypotensive and likely volume depleted from diarrhea-  giving bicarb based fluids with good results 3. Hyperkalemia-  better with medical treatment-  under 5 this AM 4. Anemia  - not too significant at this time-  supportive care    Curtis DELENA Heman 08/19/2023, 2:55 PM

## 2023-08-19 NOTE — ED Notes (Signed)
 PT provided with 2 warm blankets, Pt did not eat breakfast and requested it be thrown away. Denies any other needs at this time. Call bell in reach.

## 2023-08-20 DIAGNOSIS — E875 Hyperkalemia: Secondary | ICD-10-CM

## 2023-08-20 DIAGNOSIS — N189 Chronic kidney disease, unspecified: Secondary | ICD-10-CM | POA: Diagnosis not present

## 2023-08-20 DIAGNOSIS — N179 Acute kidney failure, unspecified: Secondary | ICD-10-CM

## 2023-08-20 LAB — RENAL FUNCTION PANEL
Albumin: 2.5 g/dL — ABNORMAL LOW (ref 3.5–5.0)
Anion gap: 11 (ref 5–15)
BUN: 96 mg/dL — ABNORMAL HIGH (ref 8–23)
CO2: 33 mmol/L — ABNORMAL HIGH (ref 22–32)
Calcium: 8.7 mg/dL — ABNORMAL LOW (ref 8.9–10.3)
Chloride: 107 mmol/L (ref 98–111)
Creatinine, Ser: 1.84 mg/dL — ABNORMAL HIGH (ref 0.44–1.00)
GFR, Estimated: 27 mL/min — ABNORMAL LOW (ref >60)
Glucose, Bld: 132 mg/dL — ABNORMAL HIGH (ref 70–99)
Phosphorus: 2.8 mg/dL (ref 2.5–4.6)
Potassium: 4 mmol/L (ref 3.5–5.1)
Sodium: 151 mmol/L — ABNORMAL HIGH (ref 135–145)

## 2023-08-20 LAB — MICROALBUMIN / CREATININE URINE RATIO
Creatinine, Urine: 106.2 mg/dL
Microalb Creat Ratio: 26 mg/g{creat} (ref 0–29)
Microalb, Ur: 27.7 ug/mL — ABNORMAL HIGH

## 2023-08-20 MED ORDER — SODIUM CHLORIDE 0.45 % IV SOLN: INTRAVENOUS | Status: DC

## 2023-08-20 MED ORDER — ONDANSETRON HCL 4 MG/2ML IJ SOLN
4.0000 mg | Freq: Four times a day (QID) | INTRAMUSCULAR | Status: DC | PRN
  Administered 2023-08-20: 4 mg via INTRAVENOUS
  Filled 2023-08-20: qty 2

## 2023-08-20 NOTE — Evaluation (Addendum)
 Occupational Therapy Evaluation Patient Details Name: Glenda Blankenship MRN: [id number] DOB: 03-Jul-1940 Today's Date: 08/20/2023   History of Present Illness   Glenda Blankenship is a 83 y.o. 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  Patient is presenting to emergency department via EMS with complaints of generalized weakness and dysuria for 3 weeks.     Clinical Impressions PTA, pt was living at home alone, she reports she was independent with ADL/IADL and functional mobility with intermittent use of RW. Pt reports she was still driving, cooking and independent with medication management. Pt currently requires cga for functional mobility at RW level. She requires cga for standing grooming task and LB dressing.  Due to decline in current level of function, pt would benefit from acute OT to address established goals to facilitate safe D/C to venue listed below. At this time, recommend HHOT follow-up. Will continue to follow acutely.      If plan is discharge home, recommend the following:   A little help with walking and/or transfers;A little help with bathing/dressing/bathroom     Functional Status Assessment   Patient has had a recent decline in their functional status and demonstrates the ability to make significant improvements in function in a reasonable and predictable amount of time.     Equipment Recommendations   None recommended by OT     Recommendations for Other Services         Precautions/Restrictions   Precautions Precautions: Fall Restrictions Weight Bearing Restrictions Per Provider Order: No     Mobility Bed Mobility Overal bed mobility: Modified Independent             General bed mobility comments: hob elevated and use of bed rails    Transfers Overall transfer level: Needs assistance Equipment used: Rolling walker (2 wheels) Transfers: Sit to/from Stand Sit  to Stand: Contact guard assist           General transfer comment: cga for safety and stability, cues for safe hand placement      Balance Overall balance assessment: Mild deficits observed, not formally tested                                         ADL either performed or assessed with clinical judgement   ADL Overall ADL's : Needs assistance/impaired Eating/Feeding: Independent   Grooming: Supervision/safety;Standing Grooming Details (indicate cue type and reason): at sink level Upper Body Bathing: Set up;Sitting   Lower Body Bathing: Supervison/ safety;Sit to/from stand   Upper Body Dressing : Set up;Sitting   Lower Body Dressing: Supervision/safety;Sit to/from stand   Toilet Transfer: Contact guard assist;Ambulation;Rolling walker (2 wheels) Toilet Transfer Details (indicate cue type and reason): min cues for safe use of RW Toileting- Clothing Manipulation and Hygiene: Contact guard assist;Sit to/from stand Toileting - Clothing Manipulation Details (indicate cue type and reason): for safety and stability     Functional mobility during ADLs: Contact guard assist;Rolling walker (2 wheels)       Vision         Perception         Praxis         Pertinent Vitals/Pain Pain Assessment Pain Assessment: No/denies pain     Extremity/Trunk Assessment Upper Extremity Assessment Upper Extremity Assessment: Generalized weakness   Lower Extremity Assessment Lower Extremity Assessment: Generalized weakness  Cervical / Trunk Assessment Cervical / Trunk Assessment: Normal   Communication Communication Communication: No apparent difficulties   Cognition Arousal: Alert Behavior During Therapy: WFL for tasks assessed/performed Cognition: No family/caregiver present to determine baseline             OT - Cognition Comments: pt with short answers during session. She was oriented to self, year, month and situation. Did not assess higher  level cognition, may benefit from further evalution at later session as pt was indpendent with driving and medication management. Did not remember working with PT 2hours prior to OT.                  Following commands: Intact       Cueing  General Comments      SpO2 80s on RA with ambulation, poor wave from upper 90s at rest on RA   Exercises     Shoulder Instructions      Home Living Family/patient expects to be discharged to:: Private residence Living Arrangements: Alone Available Help at Discharge: Family;Available PRN/intermittently Type of Home: House Home Access: Level entry     Home Layout: One level     Bathroom Shower/Tub: Chief Strategy Officer: Standard     Home Equipment: Agricultural consultant (2 wheels)          Prior Functioning/Environment Prior Level of Function : Independent/Modified Independent;Driving                    OT Problem List: Decreased activity tolerance;Impaired balance (sitting and/or standing)   OT Treatment/Interventions: Self-care/ADL training;Therapeutic exercise;Energy conservation;DME and/or AE instruction;Patient/family education      OT Goals(Current goals can be found in the care plan section)   Acute Rehab OT Goals Patient Stated Goal: to go home OT Goal Formulation: With patient Time For Goal Achievement: 09/03/23 Potential to Achieve Goals: Good ADL Goals Pt Will Perform Lower Body Dressing: Independently;sit to/from stand Pt Will Transfer to Toilet: Independently;ambulating Additional ADL Goal #1: Pt will demonstrate independence with medication management task.   OT Frequency:  Min 1X/week    Co-evaluation              AM-PAC OT 6 Clicks Daily Activity     Outcome Measure Help from another person eating meals?: None Help from another person taking care of personal grooming?: A Little Help from another person toileting, which includes using toliet, bedpan, or urinal?: A  Little Help from another person bathing (including washing, rinsing, drying)?: A Little Help from another person to put on and taking off regular upper body clothing?: A Little Help from another person to put on and taking off regular lower body clothing?: A Little 6 Click Score: 19   End of Session Equipment Utilized During Treatment: Gait belt;Rolling walker (2 wheels) Nurse Communication: Mobility status  Activity Tolerance: Patient tolerated treatment well Patient left: with call bell/phone within reach;in bed;with bed alarm set  OT Visit Diagnosis: Other abnormalities of gait and mobility (R26.89);Muscle weakness (generalized) (M62.81)                Time: 8641-8581 OT Time Calculation (min): 20 min Charges:  OT General Charges $OT Visit: 1 Visit OT Evaluation $OT Eval Low Complexity: 1 Low  Jason Hauge OTR/L Acute Rehabilitation Services Office: [phone]   Glenda Blankenship 08/20/2023, 2:38 PM

## 2023-08-20 NOTE — Progress Notes (Signed)
 Subjective:  2500 of UOP-  crt way down the last 24 hours-  still seems a little confused but this could be her baseline  Objective Vital signs in last 24 hours: Vitals:   08/20/23 0000 08/20/23 0200 08/20/23 0400 08/20/23 0728  BP: (!) 129/50  (!) 126/51 134/62  Pulse: 67 70 69 73  Resp: 14 10 10 15   Temp:   98 F (36.7 C) 98.5 F (36.9 C)  TempSrc:   Oral Oral  SpO2: 96% 94% 95% 95%  Weight:      Height:       Weight change:   Intake/Output Summary (Last 24 hours) at 08/20/2023 1208 Last data filed at 08/20/2023 1000 Gross per 24 hour  Intake 28 ml  Output 1750 ml  Net -1722 ml    Assessment/Plan: 83 year old BF presenting with AKI in the setting of hypotension while remaining on BP meds- also kidney stone 1.Renal- A on CRF-  not entirely sure what baseline is-  crt was 1.1 but in 2023.  Now with A on CRF in the setting of hypotension on ARB and diuretic.  Renal function has already improved after holding BP meds and giving hydration.  Urine is pretty bland.  Is not c/o any urinary sxms at present.  Imaging shows nonobstructing stone-  likely no action needed at this time.  Kidney function has improved remarkably the last 24 hours-   2. Hypertension/volume  - was hypotensive and likely volume depleted from diarrhea-  giving bicarb based fluids with good results- now stopped 3. Hyperkalemia- resolved 4. Anemia  - not too significant at this time-  supportive care  5. Hypernatremia-  agree with 1/2 NS   Because kidney function has improved so much feel she is out of the woods.  Renal will sign off due to high consult census-  call with any questions    Curtis DELENA Heman    Labs: Basic Metabolic Panel: Recent Labs  Lab 08/18/23 2123 08/18/23 2242 08/19/23 0455 08/20/23 0648  NA 145 144 145 151*  K 6.8* 5.8* 4.9 4.0  CL 110 120* 115* 107  CO2 13*  --  14* 33*  GLUCOSE 87 77 105* 132*  BUN 183* >130* 161* 96*  CREATININE 7.01* 6.80* 5.30* 1.84*  CALCIUM  9.9  --   8.8* 8.7*  PHOS  --   --   --  2.8   Liver Function Tests: Recent Labs  Lab 08/18/23 2123 08/19/23 0455 08/20/23 0648  AST 18 17  --   ALT 25 20  --   ALKPHOS 63 50  --   BILITOT 0.6 0.5  --   PROT 8.0 6.5  --   ALBUMIN 3.7 2.9* 2.5*   Recent Labs  Lab 08/18/23 2123  LIPASE 525*   Recent Labs  Lab 08/19/23 0455  AMMONIA 64*   CBC: Recent Labs  Lab 08/18/23 2123 08/18/23 2242 08/19/23 0455  WBC 6.0  --  5.3  NEUTROABS 4.3  --   --   HGB 11.5* 11.2* 9.7*  HCT 37.3 33.0* 31.2*  MCV 88.6  --  88.4  PLT 178  --  154   Cardiac Enzymes: No results for input(s): CKTOTAL, CKMB, CKMBINDEX, TROPONINI in the last 168 hours. CBG: Recent Labs  Lab 08/19/23 0007  GLUCAP 201*    Iron Studies: No results for input(s): IRON, TIBC, TRANSFERRIN, FERRITIN in the last 72 hours. Studies/Results: CT Renal Stone Study Result Date: 08/18/2023 CLINICAL DATA:  Renal infarction,  dysuria EXAM: CT ABDOMEN AND PELVIS WITHOUT CONTRAST TECHNIQUE: Multidetector CT imaging of the abdomen and pelvis was performed following the standard protocol without IV contrast. RADIATION DOSE REDUCTION: This exam was performed according to the departmental dose-optimization program which includes automated exposure control, adjustment of the mA and/or kV according to patient size and/or use of iterative reconstruction technique. COMPARISON:  01/08/2016 FINDINGS: Lower chest: No acute abnormality. Hepatobiliary: No focal liver abnormality is seen. No gallstones, gallbladder wall thickening, or biliary dilatation. Pancreas: Unremarkable Spleen: Unremarkable Adrenals/Urinary Tract: The adrenal glands are unremarkable. The kidneys are normal in size and position. 4 mm crescentic nonobstructing calculus is seen within the lower pole of the right kidney. The kidneys are otherwise unremarkable on this noncontrast examination. Small congenital right posterolateral bladder diverticulum. The bladder is  otherwise unremarkable. Stomach/Bowel: Stomach is within normal limits. Appendix appears normal. No evidence of bowel wall thickening, distention, or inflammatory changes. Vascular/Lymphatic: Aortic atherosclerosis. No enlarged abdominal or pelvic lymph nodes. Reproductive: Multiple calcified involuted fibroids are noted within the uterus. The pelvic organs are otherwise unremarkable. Other: No abdominal wall hernia or abnormality. No abdominopelvic ascites. Musculoskeletal: No acute bone abnormality. No lytic or blastic bone lesion. Osseous structures are age appropriate. IMPRESSION: 1. No acute intra-abdominal pathology identified. Minimal nonobstructing right nephrolithiasis. Note that renal perfusion is not well assessed on this noncontrast examination. 2. Aortic atherosclerosis. Aortic Atherosclerosis (ICD10-I70.0). Electronically Signed   By: Dorethia Molt M.D.   On: 08/18/2023 23:55   DG Chest Port 1 View Result Date: 08/18/2023 CLINICAL DATA:  Cough EXAM: PORTABLE CHEST 1 VIEW COMPARISON:  01/26/2018 FINDINGS: No acute airspace disease or effusion. Stable cardiomediastinal silhouette with aortic atherosclerosis. Chronic right-sided rib deformities. IMPRESSION: No active disease. Electronically Signed   By: Luke Bun M.D.   On: 08/18/2023 22:15   Medications: Infusions:  sodium chloride  75 mL/hr at 08/20/23 0941    Scheduled Medications:  apixaban   5 mg Oral BID   Chlorhexidine  Gluconate Cloth  6 each Topical Daily   Chlorhexidine  Gluconate Cloth  6 each Topical Daily   sodium chloride  flush  3 mL Intravenous Q12H   sodium chloride  flush  3 mL Intravenous Q12H    have reviewed scheduled and prn medications.  Physical Exam: General: thin-  soft spoken-  nad Heart: RRR Lungs: mostly clear Abdomen: soft, non tender Extremities: no edema     08/20/2023,12:08 PM  LOS: 1 day

## 2023-08-20 NOTE — Progress Notes (Addendum)
 PROGRESS NOTE        PATIENT DETAILS Name: Glenda Blankenship Age: 83 y.o. Sex: female Date of Birth: 08-01-1940 Admit Date: 08/18/2023 Admitting Physician Micaela Speaker, MD ERE:[facility], [place], NP  Brief Summary: Patient is a 83 y.o.  female with history of CKD stage IIIa, HTN, seizure disorder, VTE on Eliquis -presenting with weakness/confusion/poor oral intake/recent diarrheal illness-found to have AKI with hyperkalemia and metabolic acidosis.  Significant events: 6/20>> admit to [facility]  Significant studies: 6/20>> CT abdomen/pelvis: No acute pathology.  Significant microbiology data: 6/20>> blood cultures: No growth  Procedures: None  Consults: Nephrology  Subjective: Lying comfortably in bed-denies any chest pain or shortness of breath.  No vomiting or diarrhea.  Objective: Vitals: Blood pressure 134/62, pulse 73, temperature 98.5 F (36.9 C), temperature source Oral, resp. rate 15, height 5' 2 (1.575 m), weight 72.6 kg, SpO2 95%.   Exam: Gen Exam:Alert awake-not in any distress HEENT:atraumatic, normocephalic Chest: B/L clear to auscultation anteriorly CVS:S1S2 regular Abdomen:soft non tender, non distended Extremities:no edema Neurology: Non focal Skin: no rash  Pertinent Labs/Radiology:    Latest Ref Rng & Units 08/19/2023    4:55 AM 08/18/2023   10:42 PM 08/18/2023    9:23 PM  CBC  WBC 4.0 - 10.5 K/uL 5.3   6.0   Hemoglobin 12.0 - 15.0 g/dL 9.7  88.7  88.4   Hematocrit 36.0 - 46.0 % 31.2  33.0  37.3   Platelets 150 - 400 K/uL 154   178     Lab Results  Component Value Date   NA 151 (H) 08/20/2023   K 4.0 08/20/2023   CL 107 08/20/2023   CO2 33 (H) 08/20/2023      Assessment/Plan: AKI on CKD stage IIIa AKI hemodynamically mediated in the setting of recent GI illness/ARB/torsemide use Rapidly improving with supportive care Nephrology following  Metabolic acidosis Secondary to AKI Resolved with improvement in AKI/IV fluids  with bicarb  Hyperkalemia Secondary to AKI/ARB use Resolved with improvement in renal function  Hypernatremia Gentle hydration with half-normal saline today Encourage oral intake. Recheck electrolytes tomorrow  Acute metabolic encephalopathy Secondary to AKI/dehydration Slowly improving-able to answer questions appropriately this morning-still a bit slow though. Delirium precautions  Gastroenteritis Has resolved-no nausea/vomiting or diarrhea Tolerating diet Stool studies never sent as she was not able to provide any sample.  Normocytic anemia Likely due to combination of underlying CKD and acute illness Monitor Hb.  History of seizure disorder Has been off Keppra  since 2023 Monitor  HTN BP stable without the use of any antihypertensives  History of VTE On Eliquis    Code status:   Code Status: Full Code   DVT Prophylaxis: SCDs Start: 08/19/23 0018 Place TED hose Start: 08/19/23 0018 apixaban  (ELIQUIS ) tablet 5 mg     Family Communication: None at bedside   Disposition Plan: Status is: Inpatient Remains inpatient appropriate because: Severity of illness   Planned Discharge Destination: Home health versus SNF   Diet: Diet Order             Diet Heart Room service appropriate? Yes; Fluid consistency: Thin  Diet effective now                     Antimicrobial agents: Anti-infectives (From admission, onward)    None        MEDICATIONS: Scheduled Meds:  apixaban   5 mg Oral BID   Chlorhexidine  Gluconate Cloth  6 each Topical Daily   Chlorhexidine  Gluconate Cloth  6 each Topical Daily   sodium chloride  flush  3 mL Intravenous Q12H   sodium chloride  flush  3 mL Intravenous Q12H   Continuous Infusions: PRN Meds:.acetaminophen  **OR** acetaminophen , bismuth subsalicylate, sodium chloride  flush, trimethobenzamide   I have personally reviewed following labs and imaging studies  LABORATORY DATA: CBC: Recent Labs  Lab 08/18/23 2123  08/18/23 2242 08/19/23 0455  WBC 6.0  --  5.3  NEUTROABS 4.3  --   --   HGB 11.5* 11.2* 9.7*  HCT 37.3 33.0* 31.2*  MCV 88.6  --  88.4  PLT 178  --  154    Basic Metabolic Panel: Recent Labs  Lab 08/18/23 0010 08/18/23 2123 08/18/23 2242 08/19/23 0455 08/20/23 0648  NA 143 145 144 145 151*  K 5.0 6.8* 5.8* 4.9 4.0  CL 114* 110 120* 115* 107  CO2 13* 13*  --  14* 33*  GLUCOSE 134* 87 77 105* 132*  BUN 172* 183* >130* 161* 96*  CREATININE 6.04* 7.01* 6.80* 5.30* 1.84*  CALCIUM  9.0 9.9  --  8.8* 8.7*  MG  --  2.6*  --   --   --   PHOS  --   --   --   --  2.8    GFR: Estimated Creatinine Clearance: 21.6 mL/min (A) (by C-G formula based on SCr of 1.84 mg/dL (H)).  Liver Function Tests: Recent Labs  Lab 08/18/23 2123 08/19/23 0455 08/20/23 0648  AST 18 17  --   ALT 25 20  --   ALKPHOS 63 50  --   BILITOT 0.6 0.5  --   PROT 8.0 6.5  --   ALBUMIN 3.7 2.9* 2.5*   Recent Labs  Lab 08/18/23 2123  LIPASE 525*   Recent Labs  Lab 08/19/23 0455  AMMONIA 64*    Coagulation Profile: No results for input(s): INR, PROTIME in the last 168 hours.  Cardiac Enzymes: No results for input(s): CKTOTAL, CKMB, CKMBINDEX, TROPONINI in the last 168 hours.  BNP (last 3 results) No results for input(s): PROBNP in the last 8760 hours.  Lipid Profile: No results for input(s): CHOL, HDL, LDLCALC, TRIG, CHOLHDL, LDLDIRECT in the last 72 hours.  Thyroid  Function Tests: No results for input(s): TSH, T4TOTAL, FREET4, T3FREE, THYROIDAB in the last 72 hours.  Anemia Panel: No results for input(s): VITAMINB12, FOLATE, FERRITIN, TIBC, IRON, RETICCTPCT in the last 72 hours.  Urine analysis:    Component Value Date/Time   COLORURINE YELLOW 08/19/2023 0013   APPEARANCEUR CLEAR 08/19/2023 0013   LABSPEC 1.011 08/19/2023 0013   PHURINE 5.0 08/19/2023 0013   GLUCOSEU NEGATIVE 08/19/2023 0013   HGBUR NEGATIVE 08/19/2023 0013    BILIRUBINUR NEGATIVE 08/19/2023 0013   KETONESUR NEGATIVE 08/19/2023 0013   PROTEINUR NEGATIVE 08/19/2023 0013   NITRITE NEGATIVE 08/19/2023 0013   LEUKOCYTESUR NEGATIVE 08/19/2023 0013    Sepsis Labs: Lactic Acid, Venous    Component Value Date/Time   LATICACIDVEN 0.9 08/19/2023 0015    MICROBIOLOGY: Recent Results (from the past 240 hours)  Culture, blood (routine x 2)     Status: None (Preliminary result)   Collection Time: 08/18/23  9:23 PM   Specimen: BLOOD  Result Value Ref Range Status   Specimen Description BLOOD RIGHT ANTECUBITAL  Final   Special Requests   Final    BOTTLES DRAWN AEROBIC AND ANAEROBIC Blood Culture adequate volume  Culture   Final    NO GROWTH 2 DAYS Performed at [facility], [address], [place], [place] [address]    Report Status PENDING  Incomplete  Culture, blood (routine x 2)     Status: None (Preliminary result)   Collection Time: 08/18/23  9:43 PM   Specimen: BLOOD  Result Value Ref Range Status   Specimen Description BLOOD LEFT ANTECUBITAL  Final   Special Requests   Final    BOTTLES DRAWN AEROBIC AND ANAEROBIC Blood Culture adequate volume   Culture   Final    NO GROWTH 2 DAYS Performed at [facility], [address], [place], [place] [address]    Report Status PENDING  Incomplete    RADIOLOGY STUDIES/RESULTS: CT Renal Stone Study Result Date: 08/18/2023 CLINICAL DATA:  Renal infarction, dysuria EXAM: CT ABDOMEN AND PELVIS WITHOUT CONTRAST TECHNIQUE: Multidetector CT imaging of the abdomen and pelvis was performed following the standard protocol without IV contrast. RADIATION DOSE REDUCTION: This exam was performed according to the departmental dose-optimization program which includes automated exposure control, adjustment of the mA and/or kV according to patient size and/or use of iterative reconstruction technique. COMPARISON:  01/08/2016 FINDINGS: Lower chest: No acute abnormality. Hepatobiliary: No focal liver  abnormality is seen. No gallstones, gallbladder wall thickening, or biliary dilatation. Pancreas: Unremarkable Spleen: Unremarkable Adrenals/Urinary Tract: The adrenal glands are unremarkable. The kidneys are normal in size and position. 4 mm crescentic nonobstructing calculus is seen within the lower pole of the right kidney. The kidneys are otherwise unremarkable on this noncontrast examination. Small congenital right posterolateral bladder diverticulum. The bladder is otherwise unremarkable. Stomach/Bowel: Stomach is within normal limits. Appendix appears normal. No evidence of bowel wall thickening, distention, or inflammatory changes. Vascular/Lymphatic: Aortic atherosclerosis. No enlarged abdominal or pelvic lymph nodes. Reproductive: Multiple calcified involuted fibroids are noted within the uterus. The pelvic organs are otherwise unremarkable. Other: No abdominal wall hernia or abnormality. No abdominopelvic ascites. Musculoskeletal: No acute bone abnormality. No lytic or blastic bone lesion. Osseous structures are age appropriate. IMPRESSION: 1. No acute intra-abdominal pathology identified. Minimal nonobstructing right nephrolithiasis. Note that renal perfusion is not well assessed on this noncontrast examination. 2. Aortic atherosclerosis. Aortic Atherosclerosis (ICD10-I70.0). Electronically Signed   By: Dorethia Molt M.D.   On: 08/18/2023 23:55   DG Chest Port 1 View Result Date: 08/18/2023 CLINICAL DATA:  Cough EXAM: PORTABLE CHEST 1 VIEW COMPARISON:  01/26/2018 FINDINGS: No acute airspace disease or effusion. Stable cardiomediastinal silhouette with aortic atherosclerosis. Chronic right-sided rib deformities. IMPRESSION: No active disease. Electronically Signed   By: Luke Bun M.D.   On: 08/18/2023 22:15     LOS: 1 day   Donalda Applebaum, MD  [facility]    To contact the attending provider between 7A-7P or the covering provider during after hours 7P-7A, please log into the web  site [email or web address] and access using universal [place] password for that web site. If you do not have the password, please call the hospital operator.  08/20/2023, 8:34 AM

## 2023-08-20 NOTE — Evaluation (Signed)
 Physical Therapy Evaluation Patient Details Name: TERIANN LIVINGOOD MRN: [id number] DOB: 02-28-1941 Today's Date: 08/20/2023  History of Present Illness  CHEYNA RETANA is a 83 y.o. 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  Patient is presenting to emergency department via EMS with complaints of generalized weakness and dysuria for 3 weeks.  Clinical Impression  PTA, pt lives alone and is independent. Pt still drives, cooks, and manages her own medications. Pt A&Ox3, but not very communicative; overall follows commands. Pt ambulating to and from bathroom with CGA. Able to perform posterior peri care with set up assist. HR up to 137 bpm, so deferred further ambulation in hallway today. Recommend follow up HHPT at d/c.        If plan is discharge home, recommend the following: Assistance with cooking/housework;Assist for transportation   Can travel by private vehicle        Equipment Recommendations None recommended by PT  Recommendations for Other Services       Functional Status Assessment Patient has had a recent decline in their functional status and demonstrates the ability to make significant improvements in function in a reasonable and predictable amount of time.     Precautions / Restrictions Precautions Precautions: Fall Precaution/Restrictions Comments: Watch HR Restrictions Weight Bearing Restrictions Per Provider Order: No      Mobility  Bed Mobility Overal bed mobility: Modified Independent             General bed mobility comments: hob elevated and use of bed rails    Transfers Overall transfer level: Needs assistance Equipment used: Rolling walker (2 wheels), None Transfers: Sit to/from Stand Sit to Stand: Contact guard assist           General transfer comment: cga for safety and stability from edge of bed and low toilet surface    Ambulation/Gait Ambulation/Gait  assistance: Contact guard assist Gait Distance (Feet): 20 Feet Assistive device: Rolling walker (2 wheels), None Gait Pattern/deviations: Step-through pattern, Decreased stride length Gait velocity: decreased     General Gait Details: Pt ambulating to bathroom with RW and to chair with no AD, CGA for safety and line management  Stairs            Wheelchair Mobility     Tilt Bed    Modified Rankin (Stroke Patients Only)       Balance Overall balance assessment: Mild deficits observed, not formally tested                                           Pertinent Vitals/Pain Pain Assessment Pain Assessment: No/denies pain    Home Living Family/patient expects to be discharged to:: Private residence Living Arrangements: Alone Available Help at Discharge: Family;Available PRN/intermittently Type of Home: House Home Access: Level entry       Home Layout: One level Home Equipment: Agricultural consultant (2 wheels)      Prior Function Prior Level of Function : Independent/Modified Independent;Driving                     Extremity/Trunk Assessment   Upper Extremity Assessment Upper Extremity Assessment: Defer to OT evaluation    Lower Extremity Assessment Lower Extremity Assessment: Generalized weakness    Cervical / Trunk Assessment Cervical / Trunk Assessment: Normal  Communication   Communication Communication: No  apparent difficulties    Cognition Arousal: Alert Behavior During Therapy: Flat affect   PT - Cognitive impairments: No family/caregiver present to determine baseline                       PT - Cognition Comments: Pt A&Ox3, not oriented to day of week. Responds with short answers, does not offer a lot of information otherwise Following commands: Intact       Cueing Cueing Techniques: Verbal cues     General Comments General comments (skin integrity, edema, etc.): SpO2 80s on RA with ambulation, poor wave from  upper 90s at rest on RA    Exercises     Assessment/Plan    PT Assessment Patient needs continued PT services  PT Problem List Decreased strength;Decreased activity tolerance;Decreased balance;Decreased mobility;Cardiopulmonary status limiting activity       PT Treatment Interventions DME instruction;Gait training;Functional mobility training;Therapeutic exercise;Therapeutic activities;Balance training;Patient/family education    PT Goals (Current goals can be found in the Care Plan section)  Acute Rehab PT Goals Patient Stated Goal: did not state PT Goal Formulation: With patient Time For Goal Achievement: 09/03/23 Potential to Achieve Goals: Good    Frequency Min 2X/week     Co-evaluation               AM-PAC PT 6 Clicks Mobility  Outcome Measure Help needed turning from your back to your side while in a flat bed without using bedrails?: None Help needed moving from lying on your back to sitting on the side of a flat bed without using bedrails?: None Help needed moving to and from a bed to a chair (including a wheelchair)?: A Little Help needed standing up from a chair using your arms (e.g., wheelchair or bedside chair)?: A Little Help needed to walk in hospital room?: A Little Help needed climbing 3-5 steps with a railing? : A Little 6 Click Score: 20    End of Session   Activity Tolerance: Patient tolerated treatment well Patient left: in chair;with call bell/phone within reach;with chair alarm set Nurse Communication: Mobility status PT Visit Diagnosis: Unsteadiness on feet (R26.81)    Time: 8779-8748 PT Time Calculation (min) (ACUTE ONLY): 31 min   Charges:   PT Evaluation $PT Eval Low Complexity: 1 Low PT Treatments $Therapeutic Activity: 8-22 mins PT General Charges $$ ACUTE PT VISIT: 1 Visit         Aleck Daring, PT, DPT Acute Rehabilitation Services Office [phone]   Alayne ONEIDA Daring 08/20/2023, 2:49 PM

## 2023-08-21 DIAGNOSIS — N179 Acute kidney failure, unspecified: Secondary | ICD-10-CM | POA: Diagnosis not present

## 2023-08-21 DIAGNOSIS — E875 Hyperkalemia: Secondary | ICD-10-CM | POA: Diagnosis not present

## 2023-08-21 DIAGNOSIS — N189 Chronic kidney disease, unspecified: Secondary | ICD-10-CM | POA: Diagnosis not present

## 2023-08-21 LAB — RENAL FUNCTION PANEL
Albumin: 2.8 g/dL — ABNORMAL LOW (ref 3.5–5.0)
Anion gap: 9 (ref 5–15)
BUN: 55 mg/dL — ABNORMAL HIGH (ref 8–23)
CO2: 30 mmol/L (ref 22–32)
Calcium: 8.5 mg/dL — ABNORMAL LOW (ref 8.9–10.3)
Chloride: 111 mmol/L (ref 98–111)
Creatinine, Ser: 1.53 mg/dL — ABNORMAL HIGH (ref 0.44–1.00)
GFR, Estimated: 34 mL/min — ABNORMAL LOW (ref >60)
Glucose, Bld: 100 mg/dL — ABNORMAL HIGH (ref 70–99)
Phosphorus: 2.5 mg/dL (ref 2.5–4.6)
Potassium: 4.3 mmol/L (ref 3.5–5.1)
Sodium: 150 mmol/L — ABNORMAL HIGH (ref 135–145)

## 2023-08-21 MED ORDER — DEXTROSE 5 % IV SOLN: INTRAVENOUS | Status: AC

## 2023-08-21 MED ORDER — ENSURE PLUS HIGH PROTEIN PO LIQD
237.0000 mL | Freq: Two times a day (BID) | ORAL | Status: DC
  Administered 2023-08-21 – 2023-08-23 (×4): 237 mL via ORAL

## 2023-08-21 NOTE — Progress Notes (Signed)
 Physical Therapy Treatment Patient Details Name: Glenda Blankenship MRN: [id number] DOB: 09-28-1940 Today's Date: 08/21/2023   History of Present Illness Glenda Blankenship is a 83 y.o. 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  Patient is presenting to emergency department via EMS with complaints of generalized weakness and dysuria for 3 weeks.    PT Comments  Pt oriented to day of week, however, continues with some confusion; needs reminders for doffing/donning underwear with toileting. Refer to OT note for results of Short Blessed Test. Pt overall is mobilizing well. Pt ambulating 120 ft with no assistive device. HR up to 138 bpm. Recommend HH safety evaluation at d/c and intermittent family supervision for IADL's.     If plan is discharge home, recommend the following: Assistance with cooking/housework;Assist for transportation   Can travel by private vehicle        Equipment Recommendations  None recommended by PT    Recommendations for Other Services       Precautions / Restrictions Precautions Precautions: Fall Precaution/Restrictions Comments: Watch HR Restrictions Weight Bearing Restrictions Per Provider Order: No     Mobility  Bed Mobility Overal bed mobility: Modified Independent                  Transfers Overall transfer level: Needs assistance Equipment used: None Transfers: Sit to/from Stand Sit to Stand: Supervision                Ambulation/Gait Ambulation/Gait assistance: Supervision, Contact guard assist Gait Distance (Feet): 120 Feet Assistive device: None Gait Pattern/deviations: Step-through pattern, Decreased stride length Gait velocity: decreased     General Gait Details: Supervision-CGA, no gross unsteadiness, slower pace   Stairs             Wheelchair Mobility     Tilt Bed    Modified Rankin (Stroke Patients Only)       Balance  Overall balance assessment: Mild deficits observed, not formally tested                                          Communication Communication Communication: No apparent difficulties  Cognition Arousal: Alert Behavior During Therapy: Flat affect   PT - Cognitive impairments: No family/caregiver present to determine baseline                       PT - Cognition Comments: Pt oriented to day of week today, needed cues for reminders to doff/don underwear when toileting Following commands: Intact      Cueing Cueing Techniques: Verbal cues  Exercises      General Comments        Pertinent Vitals/Pain Pain Assessment Pain Assessment: No/denies pain    Home Living                          Prior Function            PT Goals (current goals can now be found in the care plan section) Acute Rehab PT Goals Potential to Achieve Goals: Good Progress towards PT goals: Progressing toward goals    Frequency    Min 2X/week      PT Plan      Co-evaluation  AM-PAC PT 6 Clicks Mobility   Outcome Measure  Help needed turning from your back to your side while in a flat bed without using bedrails?: None Help needed moving from lying on your back to sitting on the side of a flat bed without using bedrails?: None Help needed moving to and from a bed to a chair (including a wheelchair)?: A Little Help needed standing up from a chair using your arms (e.g., wheelchair or bedside chair)?: A Little Help needed to walk in hospital room?: A Little Help needed climbing 3-5 steps with a railing? : A Little 6 Click Score: 20    End of Session   Activity Tolerance: Patient tolerated treatment well Patient left: in chair;with call bell/phone within reach;with chair alarm set Nurse Communication: Mobility status PT Visit Diagnosis: Unsteadiness on feet (R26.81)     Time: 8863-8844 PT Time Calculation (min) (ACUTE ONLY): 19  min  Charges:    $Therapeutic Activity: 8-22 mins PT General Charges $$ ACUTE PT VISIT: 1 Visit                     Glenda Blankenship, PT, DPT Acute Rehabilitation Services Office [phone]    Glenda Blankenship 08/21/2023, 2:02 PM

## 2023-08-21 NOTE — Progress Notes (Signed)
 PROGRESS NOTE        PATIENT DETAILS Name: Glenda Blankenship Age: 83 y.o. Sex: female Date of Birth: 06-Apr-1940 Admit Date: 08/18/2023 Admitting Physician Micaela Speaker, MD ERE:[facility]zm[facility], Norman, NP  Brief Summary: Patient is a 83 y.o.  female with history of CKD stage IIIa, HTN, seizure disorder, VTE on Eliquis -presenting with weakness/confusion/poor oral intake/recent diarrheal illness-found to have AKI with hyperkalemia and metabolic acidosis.  Significant events: 6/20>> admit to [facility]  Significant studies: 6/20>> CT abdomen/pelvis: No acute pathology.  Significant microbiology data: 6/20>> blood cultures: No growth  Procedures: None  Consults: Nephrology  Subjective: No major issues overnight-much more awake and alert today compared to the past several days.  Much more fluent in answering questions.  She vomited once yesterday but none since then.  Feels much better today.  Objective: Vitals: Blood pressure (!) 143/58, pulse 93, temperature 99.1 F (37.3 C), temperature source Oral, resp. rate 14, height 5' 2 (1.575 m), weight 72.6 kg, SpO2 93%.   Exam: Gen Exam:Alert awake-not in any distress HEENT:atraumatic, normocephalic Chest: B/L clear to auscultation anteriorly CVS:S1S2 regular Abdomen:soft non tender, non distended Extremities:no edema Neurology: Non focal Skin: no rash  Pertinent Labs/Radiology:    Latest Ref Rng & Units 08/19/2023    4:55 AM 08/18/2023   10:42 PM 08/18/2023    9:23 PM  CBC  WBC 4.0 - 10.5 K/uL 5.3   6.0   Hemoglobin 12.0 - 15.0 g/dL 9.7  88.7  88.4   Hematocrit 36.0 - 46.0 % 31.2  33.0  37.3   Platelets 150 - 400 K/uL 154   178     Lab Results  Component Value Date   NA 150 (H) 08/21/2023   K 4.3 08/21/2023   CL 111 08/21/2023   CO2 30 08/21/2023      Assessment/Plan: AKI on CKD stage IIIa AKI hemodynamically mediated in the setting of recent GI illness/ARB/torsemide use Creatinine is rapidly  improving-approaching baseline Nephrology has signed off 6/22.  Metabolic acidosis Secondary to AKI Resolved with improvement in AKI/IV fluids with bicarb  Hyperkalemia Secondary to AKI/ARB use Resolved with improvement in renal function  Hypernatremia Continue to encourage oral intake Switch half NS to D5W for a few hours today Recheck electrolytes tomorrow.   Acute metabolic encephalopathy Secondary to AKI/dehydration Much improved today-more fluent and answering questions compared to yesterday  Gastroenteritis Improved-no diarrhea but did have 1 episode of vomiting on 6/22 Since diarrhea has resolved-stool studies never sent Continue antiemetics and provide supportive care.  Normocytic anemia Likely due to combination of underlying CKD and acute illness Monitor Hb.  History of seizure disorder Has been off Keppra  since 2023 Monitor  HTN BP stable without the use of any antihypertensives-Benicar/torsemide remains on hold-if antihypertensive is needed-will use amlodipine  for now.  History of VTE On Eliquis   Debility/deconditioning PT/OT eval-Home health recommended.  Code status:   Code Status: Full Code   DVT Prophylaxis: SCDs Start: 08/19/23 0018 Place TED hose Start: 08/19/23 0018 apixaban  (ELIQUIS ) tablet 5 mg     Family Communication: Son-Billy-[phone]-updated 6/23   Disposition Plan: Status is: Inpatient Remains inpatient appropriate because: Severity of illness   Planned Discharge Destination: Home health   Diet: Diet Order             Diet Heart Room service appropriate? Yes; Fluid consistency: Thin  Diet effective now  Antimicrobial agents: Anti-infectives (From admission, onward)    None        MEDICATIONS: Scheduled Meds:  apixaban   5 mg Oral BID   Chlorhexidine  Gluconate Cloth  6 each Topical Daily   Chlorhexidine  Gluconate Cloth  6 each Topical Daily   sodium chloride  flush  3 mL Intravenous  Q12H   sodium chloride  flush  3 mL Intravenous Q12H   Continuous Infusions:  dextrose      PRN Meds:.acetaminophen  **OR** acetaminophen , bismuth subsalicylate, ondansetron  (ZOFRAN ) IV, sodium chloride  flush   I have personally reviewed following labs and imaging studies  LABORATORY DATA: CBC: Recent Labs  Lab 08/18/23 2123 08/18/23 2242 08/19/23 0455  WBC 6.0  --  5.3  NEUTROABS 4.3  --   --   HGB 11.5* 11.2* 9.7*  HCT 37.3 33.0* 31.2*  MCV 88.6  --  88.4  PLT 178  --  154    Basic Metabolic Panel: Recent Labs  Lab 08/18/23 0010 08/18/23 2123 08/18/23 2242 08/19/23 0455 08/20/23 0648 08/21/23 0557  NA 143 145 144 145 151* 150*  K 5.0 6.8* 5.8* 4.9 4.0 4.3  CL 114* 110 120* 115* 107 111  CO2 13* 13*  --  14* 33* 30  GLUCOSE 134* 87 77 105* 132* 100*  BUN 172* 183* >130* 161* 96* 55*  CREATININE 6.04* 7.01* 6.80* 5.30* 1.84* 1.53*  CALCIUM  9.0 9.9  --  8.8* 8.7* 8.5*  MG  --  2.6*  --   --   --   --   PHOS  --   --   --   --  2.8 2.5    GFR: Estimated Creatinine Clearance: 26 mL/min (A) (by C-G formula based on SCr of 1.53 mg/dL (H)).  Liver Function Tests: Recent Labs  Lab 08/18/23 2123 08/19/23 0455 08/20/23 0648 08/21/23 0557  AST 18 17  --   --   ALT 25 20  --   --   ALKPHOS 63 50  --   --   BILITOT 0.6 0.5  --   --   PROT 8.0 6.5  --   --   ALBUMIN 3.7 2.9* 2.5* 2.8*   Recent Labs  Lab 08/18/23 2123  LIPASE 525*   Recent Labs  Lab 08/19/23 0455  AMMONIA 64*    Coagulation Profile: No results for input(s): INR, PROTIME in the last 168 hours.  Cardiac Enzymes: No results for input(s): CKTOTAL, CKMB, CKMBINDEX, TROPONINI in the last 168 hours.  BNP (last 3 results) No results for input(s): PROBNP in the last 8760 hours.  Lipid Profile: No results for input(s): CHOL, HDL, LDLCALC, TRIG, CHOLHDL, LDLDIRECT in the last 72 hours.  Thyroid  Function Tests: No results for input(s): TSH, T4TOTAL, FREET4,  T3FREE, THYROIDAB in the last 72 hours.  Anemia Panel: No results for input(s): VITAMINB12, FOLATE, FERRITIN, TIBC, IRON, RETICCTPCT in the last 72 hours.  Urine analysis:    Component Value Date/Time   COLORURINE YELLOW 08/19/2023 0013   APPEARANCEUR CLEAR 08/19/2023 0013   LABSPEC 1.011 08/19/2023 0013   PHURINE 5.0 08/19/2023 0013   GLUCOSEU NEGATIVE 08/19/2023 0013   HGBUR NEGATIVE 08/19/2023 0013   BILIRUBINUR NEGATIVE 08/19/2023 0013   KETONESUR NEGATIVE 08/19/2023 0013   PROTEINUR NEGATIVE 08/19/2023 0013   NITRITE NEGATIVE 08/19/2023 0013   LEUKOCYTESUR NEGATIVE 08/19/2023 0013    Sepsis Labs: Lactic Acid, Venous    Component Value Date/Time   LATICACIDVEN 0.9 08/19/2023 0015    MICROBIOLOGY: Recent Results (from the past 240  hours)  Culture, blood (routine x 2)     Status: None (Preliminary result)   Collection Time: 08/18/23  9:23 PM   Specimen: BLOOD  Result Value Ref Range Status   Specimen Description BLOOD RIGHT ANTECUBITAL  Final   Special Requests   Final    BOTTLES DRAWN AEROBIC AND ANAEROBIC Blood Culture adequate volume   Culture   Final    NO GROWTH 2 DAYS Performed at [facility], [address] [place], [place] [address]    Report Status PENDING  Incomplete  Culture, blood (routine x 2)     Status: None (Preliminary result)   Collection Time: 08/18/23  9:43 PM   Specimen: BLOOD  Result Value Ref Range Status   Specimen Description BLOOD LEFT ANTECUBITAL  Final   Special Requests   Final    BOTTLES DRAWN AEROBIC AND ANAEROBIC Blood Culture adequate volume   Culture   Final    NO GROWTH 2 DAYS Performed at [facility], [address], [place], [place] [address]    Report Status PENDING  Incomplete    RADIOLOGY STUDIES/RESULTS: No results found.    LOS: 2 days   Donalda Applebaum, MD  [facility]    To contact the attending provider between 7A-7P or the covering provider during after hours 7P-7A,  please log into the web site [email or web address] and access using universal [place] password for that web site. If you do not have the password, please call the hospital operator.  08/21/2023, 8:58 AM

## 2023-08-21 NOTE — TOC Progression Note (Signed)
 Transition of Care [facility]) - Progression Note    Patient Details  Name: Glenda Blankenship MRN: [id number] Date of Birth: [birth year]  [facility] CM/SW Contact  Hendricks KANDICE Her, RN Phone Number: 08/21/2023, 11:55 AM  Clinical Narrative:     Patient being recommended Home Health PT and OT  [facility] will provide services. Orders have been entered. No DME recommended as of this writing      TOC will continue to follow patient for any additional discharge needs           Expected Discharge Plan and Services                                               Social Determinants of Health (SDOH) Interventions SDOH Screenings   Food Insecurity: Patient Declined (08/20/2023)  Housing: Patient Declined (08/20/2023)  Transportation Needs: Patient Declined (08/20/2023)  Utilities: Patient Declined (08/20/2023)  Tobacco Use: Medium Risk (08/19/2023)    Readmission Risk Interventions     No data to display

## 2023-08-21 NOTE — Progress Notes (Signed)
   08/21/23 1143  TOC Brief Assessment  Insurance and Status Reviewed [facility] Medicare)  Patient has primary care physician Yes [place] Larve NP)  Home environment has been reviewed from home  Prior level of function: PTA, pt lives alone and is independent. Pt still drives, cooks, and manages her own medications.  Prior/Current Home Services No current home services  Social Drivers of Health Review SDOH reviewed no interventions necessary  Readmission risk has been reviewed Yes (19%)  Transition of care needs no transition of care needs at this time   Please place [facility] consult for DC needs

## 2023-08-21 NOTE — Progress Notes (Signed)
 Occupational Therapy Treatment Patient Details Name: Glenda Blankenship MRN: [id number] DOB: 06/06/1940 Today's Date: 08/21/2023   History of present illness Glenda Blankenship is a 83 y.o. 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  Patient is presenting to emergency department via EMS with complaints of generalized weakness and dysuria for 3 weeks.   OT comments  Pt up in chair upon arrival post PT. PT reports pt needing reminder to pull pants down prior to toileting. Administered Short Blessed Test. Pt scored 14/28 (10 or more=impairment consistent with dementia). When asked, pt reports,I didn't do good. Pt with deficits in attention while stating months backward and with memory when asked to recall a name and address from beginning to end of session. Began educating pt in implications of memory deficits and safety with medication management, financial management and use of stove as well as compensatory strategies. Attempted to call family, with pt's approval, to give results of cognitive assessment and observations in hospital setting, unable to reach. Will continue to follow and reassess cognition as pt's medical conditions resolve.       If plan is discharge home, recommend the following:  A little help with walking and/or transfers;A little help with bathing/dressing/bathroom;Direct supervision/assist for medications management;Direct supervision/assist for financial management;Assistance with cooking/housework   Equipment Recommendations  None recommended by OT    Recommendations for Other Services      Precautions / Restrictions Precautions Precautions: Fall Precaution/Restrictions Comments: Watch HR Restrictions Weight Bearing Restrictions Per Provider Order: No       Mobility Bed Mobility                    Transfers                         Balance                                            ADL either performed or assessed with clinical judgement   ADL                                 Toileting - Clothing Manipulation Details (indicate cue type and reason): per PT, pt sitting on toilet without pulling down clothing            Extremity/Trunk Assessment              Vision       Perception     Praxis     Communication Communication Communication: No apparent difficulties   Cognition Arousal: Alert Behavior During Therapy: Flat affect Cognition: No family/caregiver present to determine baseline             OT - Cognition Comments: Scored 14/28 on Short Blessed Test                 Following commands: Intact        Cueing   Cueing Techniques: Verbal cues  Exercises      Shoulder Instructions       General Comments      Pertinent Vitals/ Pain       Pain Assessment Pain Assessment: No/denies pain  Home Living  Prior Functioning/Environment              Frequency  Min 1X/week        Progress Toward Goals  OT Goals(current goals can now be found in the care plan section)  Progress towards OT goals: Progressing toward goals  Acute Rehab OT Goals OT Goal Formulation: With patient Time For Goal Achievement: 09/03/23 Potential to Achieve Goals: Good  Plan      Co-evaluation                 AM-PAC OT 6 Clicks Daily Activity     Outcome Measure   Help from another person eating meals?: None Help from another person taking care of personal grooming?: A Little Help from another person toileting, which includes using toliet, bedpan, or urinal?: A Little Help from another person bathing (including washing, rinsing, drying)?: A Little Help from another person to put on and taking off regular upper body clothing?: A Little Help from another person to put on and taking off regular lower body clothing?: A  Little 6 Click Score: 19    End of Session    OT Visit Diagnosis: Other abnormalities of gait and mobility (R26.89);Muscle weakness (generalized) (M62.81);Other symptoms and signs involving cognitive function   Activity Tolerance Patient tolerated treatment well   Patient Left in chair;with call bell/phone within reach;with chair alarm set   Nurse Communication Other (comment) (cognition)        Time: 8769-8754 OT Time Calculation (min): 15 min  Charges: OT General Charges $OT Visit: 1 Visit OT Treatments $Cognitive Funtion inital: Initial 15 mins  Mliss HERO, OTR/L Acute Rehabilitation Services Office: [phone]   Kennth Mliss Helling 08/21/2023, 1:07 PM

## 2023-08-21 NOTE — Plan of Care (Signed)
  Problem: Education: Goal: Knowledge of General Education information will improve Description: Including pain rating scale, medication(s)/side effects and non-pharmacologic comfort measures Outcome: Progressing   Problem: Clinical Measurements: Goal: Will remain free from infection Outcome: Progressing Goal: Respiratory complications will improve Outcome: Progressing   Problem: Activity: Goal: Risk for activity intolerance will decrease Outcome: Progressing   Problem: Coping: Goal: Level of anxiety will decrease Outcome: Progressing   Problem: Elimination: Goal: Will not experience complications related to bowel motility Outcome: Progressing

## 2023-08-22 DIAGNOSIS — N189 Chronic kidney disease, unspecified: Secondary | ICD-10-CM | POA: Diagnosis not present

## 2023-08-22 DIAGNOSIS — N179 Acute kidney failure, unspecified: Secondary | ICD-10-CM | POA: Diagnosis not present

## 2023-08-22 DIAGNOSIS — E875 Hyperkalemia: Secondary | ICD-10-CM | POA: Diagnosis not present

## 2023-08-22 LAB — RENAL FUNCTION PANEL
Albumin: 2.7 g/dL — ABNORMAL LOW (ref 3.5–5.0)
Anion gap: 9 (ref 5–15)
BUN: 34 mg/dL — ABNORMAL HIGH (ref 8–23)
CO2: 33 mmol/L — ABNORMAL HIGH (ref 22–32)
Calcium: 8.8 mg/dL — ABNORMAL LOW (ref 8.9–10.3)
Chloride: 110 mmol/L (ref 98–111)
Creatinine, Ser: 1.34 mg/dL — ABNORMAL HIGH (ref 0.44–1.00)
GFR, Estimated: 39 mL/min — ABNORMAL LOW (ref >60)
Glucose, Bld: 116 mg/dL — ABNORMAL HIGH (ref 70–99)
Phosphorus: 1.8 mg/dL — ABNORMAL LOW (ref 2.5–4.6)
Potassium: 4.7 mmol/L (ref 3.5–5.1)
Sodium: 152 mmol/L — ABNORMAL HIGH (ref 135–145)

## 2023-08-22 MED ORDER — AMLODIPINE BESYLATE 5 MG PO TABS
5.0000 mg | ORAL_TABLET | Freq: Every day | ORAL | Status: DC
  Administered 2023-08-22 – 2023-08-23 (×2): 5 mg via ORAL
  Filled 2023-08-22 (×2): qty 1

## 2023-08-22 MED ORDER — POTASSIUM PHOSPHATES 15 MMOLE/5ML IV SOLN
30.0000 mmol | Freq: Once | INTRAVENOUS | Status: AC
  Administered 2023-08-22: 30 mmol via INTRAVENOUS
  Filled 2023-08-22: qty 10

## 2023-08-22 MED ORDER — DEXTROSE 5 % IV SOLN: INTRAVENOUS | Status: DC

## 2023-08-22 NOTE — Care Management Important Message (Signed)
 Important Message  Patient Details  Name: Glenda Blankenship MRN: [id number] Date of Birth: [birth year]   Important Message Given:  Yes - Medicare IM     Jon Cruel 08/22/2023, 12:04 PM

## 2023-08-22 NOTE — Plan of Care (Signed)

## 2023-08-22 NOTE — Progress Notes (Signed)
 PROGRESS NOTE        PATIENT DETAILS Name: Glenda Blankenship Age: 83 y.o. Sex: female Date of Birth: 1940-12-08 Admit Date: 08/18/2023 Admitting Physician Micaela Speaker, MD ERE:[facility], [place], NP  Brief Summary: Patient is a 83 y.o.  female with history of CKD stage IIIa, HTN, seizure disorder, VTE on Eliquis -presenting with weakness/confusion/poor oral intake/recent diarrheal illness-found to have AKI with hyperkalemia and metabolic acidosis.  Significant events: 6/20>> admit to [facility]  Significant studies: 6/20>> CT abdomen/pelvis: No acute pathology.  Significant microbiology data: 6/20>> blood cultures: No growth  Procedures: None  Consults: Nephrology  Subjective: No nausea vomiting or diarrhea.  Oral intake is poor but apparently has claims she has been drinking ensures and eating a significant portion of her meals.  Objective: Vitals: Blood pressure (!) 159/67, pulse 93, temperature 98.3 F (36.8 C), temperature source Oral, resp. rate 12, height 5' 2 (1.575 m), weight 72.6 kg, SpO2 95%.   Exam: Awake/alert Not in any distress Chest: Clear to auscultation Abdomen: Soft nontender nondistended Extremities: No edema Nonfocal exam  Pertinent Labs/Radiology:    Latest Ref Rng & Units 08/19/2023    4:55 AM 08/18/2023   10:42 PM 08/18/2023    9:23 PM  CBC  WBC 4.0 - 10.5 K/uL 5.3   6.0   Hemoglobin 12.0 - 15.0 g/dL 9.7  88.7  88.4   Hematocrit 36.0 - 46.0 % 31.2  33.0  37.3   Platelets 150 - 400 K/uL 154   178     Lab Results  Component Value Date   NA 152 (H) 08/22/2023   K 4.7 08/22/2023   CL 110 08/22/2023   CO2 33 (H) 08/22/2023      Assessment/Plan: AKI on CKD stage IIIa AKI hemodynamically mediated in the setting of recent GI illness/ARB/torsemide use Creatinine has rapidly improved-close to baseline. Nephrology has signed off 6/22.  Metabolic acidosis Secondary to AKI Resolved with improvement in AKI/IV fluids with  bicarb  Hyperkalemia Secondary to AKI/ARB use Resolved with improvement in renal function  Hypernatremia Secondary to poor oral intake Increase D5 W to 75 cc/hour to continue throughout the day Recheck electrolytes tomorrow Encourage oral intake.    Acute metabolic encephalopathy Secondary to AKI/dehydration Awake and alert this morning-significantly better.  Gastroenteritis Improved-no diarrhea but did have 1 episode of vomiting on 6/22 Since diarrhea has resolved-stool studies never sent Continue antiemetics and provide supportive care.  Normocytic anemia Likely due to combination of underlying CKD and acute illness Monitor Hb.  History of seizure disorder Has been off Keppra  since 2023 Monitor  HTN BP creeping up-start amlodipine  Hold Benicar/torsemide given resolving AKI-this can be resumed in the outpatient setting at PCPs discretion.  History of VTE On Eliquis   Debility/deconditioning PT/OT eval-Home health recommended.  Code status:   Code Status: Full Code   DVT Prophylaxis: SCDs Start: 08/19/23 0018 Place TED hose Start: 08/19/23 0018 apixaban  (ELIQUIS ) tablet 5 mg     Family Communication: Son-Billy-[phone]-updated 6/23   Disposition Plan: Status is: Inpatient Remains inpatient appropriate because: Severity of illness   Planned Discharge Destination: Home health   Diet: Diet Order             Diet Heart Room service appropriate? Yes; Fluid consistency: Thin  Diet effective now  Antimicrobial agents: Anti-infectives (From admission, onward)    None        MEDICATIONS: Scheduled Meds:  apixaban   5 mg Oral BID   Chlorhexidine  Gluconate Cloth  6 each Topical Daily   Chlorhexidine  Gluconate Cloth  6 each Topical Daily   feeding supplement  237 mL Oral BID BM   sodium chloride  flush  3 mL Intravenous Q12H   sodium chloride  flush  3 mL Intravenous Q12H   Continuous Infusions:  dextrose      potassium  PHOSPHATE IVPB (in mmol)     PRN Meds:.acetaminophen  **OR** acetaminophen , bismuth subsalicylate, ondansetron  (ZOFRAN ) IV, sodium chloride  flush   I have personally reviewed following labs and imaging studies  LABORATORY DATA: CBC: Recent Labs  Lab 08/18/23 2123 08/18/23 2242 08/19/23 0455  WBC 6.0  --  5.3  NEUTROABS 4.3  --   --   HGB 11.5* 11.2* 9.7*  HCT 37.3 33.0* 31.2*  MCV 88.6  --  88.4  PLT 178  --  154    Basic Metabolic Panel: Recent Labs  Lab 08/18/23 2123 08/18/23 2242 08/19/23 0455 08/20/23 0648 08/21/23 0557 08/22/23 0550  NA 145 144 145 151* 150* 152*  K 6.8* 5.8* 4.9 4.0 4.3 4.7  CL 110 120* 115* 107 111 110  CO2 13*  --  14* 33* 30 33*  GLUCOSE 87 77 105* 132* 100* 116*  BUN 183* >130* 161* 96* 55* 34*  CREATININE 7.01* 6.80* 5.30* 1.84* 1.53* 1.34*  CALCIUM  9.9  --  8.8* 8.7* 8.5* 8.8*  MG 2.6*  --   --   --   --   --   PHOS  --   --   --  2.8 2.5 1.8*    GFR: Estimated Creatinine Clearance: 29.7 mL/min (A) (by C-G formula based on SCr of 1.34 mg/dL (H)).  Liver Function Tests: Recent Labs  Lab 08/18/23 2123 08/19/23 0455 08/20/23 0648 08/21/23 0557 08/22/23 0550  AST 18 17  --   --   --   ALT 25 20  --   --   --   ALKPHOS 63 50  --   --   --   BILITOT 0.6 0.5  --   --   --   PROT 8.0 6.5  --   --   --   ALBUMIN 3.7 2.9* 2.5* 2.8* 2.7*   Recent Labs  Lab 08/18/23 2123  LIPASE 525*   Recent Labs  Lab 08/19/23 0455  AMMONIA 64*    Coagulation Profile: No results for input(s): INR, PROTIME in the last 168 hours.  Cardiac Enzymes: No results for input(s): CKTOTAL, CKMB, CKMBINDEX, TROPONINI in the last 168 hours.  BNP (last 3 results) No results for input(s): PROBNP in the last 8760 hours.  Lipid Profile: No results for input(s): CHOL, HDL, LDLCALC, TRIG, CHOLHDL, LDLDIRECT in the last 72 hours.  Thyroid  Function Tests: No results for input(s): TSH, T4TOTAL, FREET4, T3FREE,  THYROIDAB in the last 72 hours.  Anemia Panel: No results for input(s): VITAMINB12, FOLATE, FERRITIN, TIBC, IRON, RETICCTPCT in the last 72 hours.  Urine analysis:    Component Value Date/Time   COLORURINE YELLOW 08/19/2023 0013   APPEARANCEUR CLEAR 08/19/2023 0013   LABSPEC 1.011 08/19/2023 0013   PHURINE 5.0 08/19/2023 0013   GLUCOSEU NEGATIVE 08/19/2023 0013   HGBUR NEGATIVE 08/19/2023 0013   BILIRUBINUR NEGATIVE 08/19/2023 0013   KETONESUR NEGATIVE 08/19/2023 0013   PROTEINUR NEGATIVE 08/19/2023 0013   NITRITE NEGATIVE 08/19/2023 0013  LEUKOCYTESUR NEGATIVE 08/19/2023 0013    Sepsis Labs: Lactic Acid, Venous    Component Value Date/Time   LATICACIDVEN 0.9 08/19/2023 0015    MICROBIOLOGY: Recent Results (from the past 240 hours)  Culture, blood (routine x 2)     Status: None (Preliminary result)   Collection Time: 08/18/23  9:23 PM   Specimen: BLOOD  Result Value Ref Range Status   Specimen Description BLOOD RIGHT ANTECUBITAL  Final   Special Requests   Final    BOTTLES DRAWN AEROBIC AND ANAEROBIC Blood Culture adequate volume   Culture   Final    NO GROWTH 4 DAYS Performed at [facility], [address] [place], [place] [address]    Report Status PENDING  Incomplete  Culture, blood (routine x 2)     Status: None (Preliminary result)   Collection Time: 08/18/23  9:43 PM   Specimen: BLOOD  Result Value Ref Range Status   Specimen Description BLOOD LEFT ANTECUBITAL  Final   Special Requests   Final    BOTTLES DRAWN AEROBIC AND ANAEROBIC Blood Culture adequate volume   Culture   Final    NO GROWTH 4 DAYS Performed at [facility], [address], [place], [place] [address]    Report Status PENDING  Incomplete    RADIOLOGY STUDIES/RESULTS: No results found.    LOS: 3 days   Donalda Applebaum, MD  [facility]    To contact the attending provider between 7A-7P or the covering provider during after hours 7P-7A, please  log into the web site [email or web address] and access using universal [place] password for that web site. If you do not have the password, please call the hospital operator.  08/22/2023, 9:13 AM

## 2023-08-22 NOTE — Progress Notes (Incomplete)
 Initial Nutrition Assessment  DOCUMENTATION CODES:      INTERVENTION:     NUTRITION DIAGNOSIS:     related to   as evidenced by  .    GOAL:        MONITOR:      REASON FOR ASSESSMENT:   Rounds    ASSESSMENT:         NUTRITION - FOCUSED PHYSICAL EXAM:  {RD Focused Exam List:21252}  Diet Order:   Diet Order             Diet Heart Room service appropriate? Yes; Fluid consistency: Thin  Diet effective now                   EDUCATION NEEDS:      Skin:     Last BM:     Height:  Ht Readings from Last 1 Encounters:  08/18/23 5' 2 (1.575 m)   Weight:  Wt Readings from Last 1 Encounters:  08/18/23 72.6 kg   Ideal Body Weight:     BMI:  Body mass index is 29.26 kg/m.  Estimated Nutritional Needs:   Kcal:     Protein:     Fluid:     Blair Deaner MS, RD, LDN Registered Dietitian Clinical Nutrition RD Inpatient Contact Info in Amion

## 2023-08-22 NOTE — Progress Notes (Signed)
 Occupational Therapy Treatment Patient Details Name: Glenda Blankenship MRN: [id number] DOB: October 12, 1940 Today's Date: 08/22/2023   History of present illness Glenda Blankenship is a 83 y.o. 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  Patient is presenting to emergency department via EMS with complaints of generalized weakness and dysuria for 3 weeks.   OT comments  Pt presented in bed and agreeable to session. She was cued to go to EOB and needed increase in time to problem solve to go to the side the bed rail was lowered to go around the bed as reported they needed to urinate. Pt required CGA with initial ambulation as they were furniture walking the the bathroom. She then urinated and hygiene but cued about direction to decrease risk of UTIs and then she started to exit the bathroom with still carrying her gown up in her arms. Opt then was set up with ADLS at sink level and was limited due to HR as went up to 151. Pt also noted when asking to complete oral care and light sponge clean up would not use soap. At this time pt may need an increase in level of supervision with the return to home. When ask about family support she reported her two sisters can help and reported they are in their 61s and she guessed they were in good health condition to assist.       If plan is discharge home, recommend the following:  A little help with walking and/or transfers;A little help with bathing/dressing/bathroom;Direct supervision/assist for medications management;Direct supervision/assist for financial management;Assistance with cooking/housework   Equipment Recommendations  None recommended by OT    Recommendations for Other Services      Precautions / Restrictions Precautions Precautions: Fall Precaution/Restrictions Comments: Watch HR Restrictions Weight Bearing Restrictions Per Provider Order: No       Mobility Bed  Mobility Overal bed mobility: Needs Assistance Bed Mobility: Supine to Sit     Supine to sit: Supervision     General bed mobility comments: This reporting therapist dropped down rail on R side of bed bed and when cued to got to EOB needed cue to go that direction    Transfers Overall transfer level: Needs assistance Equipment used: None Transfers: Sit to/from Stand Sit to Stand: Supervision                 Balance Overall balance assessment: Mild deficits observed, not formally tested                                         ADL either performed or assessed with clinical judgement   ADL Overall ADL's : Needs assistance/impaired Eating/Feeding: Independent;Sitting   Grooming: Wash/dry face;Wash/dry hands;Oral care;Applying deodorant;Set up;Supervision/safety;Cueing for safety;Cueing for sequencing;Sitting;Standing Grooming Details (indicate cue type and reason): at sink, limted standing due to HR Upper Body Bathing: Set up;Sitting   Lower Body Bathing: Supervison/ safety;Sit to/from stand Lower Body Bathing Details (indicate cue type and reason): Pt noted when asking to clean self pt did not clean peri area Upper Body Dressing : Set up;Sitting   Lower Body Dressing: Supervision/safety;Sit to/from stand Lower Body Dressing Details (indicate cue type and reason): simulated Toilet Transfer: Contact guard assist;Cueing for safety;Cueing for sequencing   Toileting- Clothing Manipulation and Hygiene: Supervision/safety;Sit to/from stand Toileting - Clothing Manipulation Details (indicate cue type  and reason): pt wiped back to front and then back to front     Functional mobility during ADLs: Contact guard assist;Supervision/safety      Extremity/Trunk Assessment Upper Extremity Assessment Upper Extremity Assessment: Overall WFL for tasks assessed   Lower Extremity Assessment Lower Extremity Assessment: Defer to PT evaluation        Vision    Additional Comments: Per pt reported they wear glasses at baseline   Perception Perception Perception: Within Functional Limits   Praxis Praxis Praxis: WFL   Communication Communication Communication: No apparent difficulties   Cognition Arousal: Alert Behavior During Therapy: Flat affect Cognition: No family/caregiver present to determine baseline                               Following commands: Intact        Cueing   Cueing Techniques: Verbal cues  Exercises      Shoulder Instructions       General Comments limited due to HR in session and required more sitting in session    Pertinent Vitals/ Pain       Pain Assessment Pain Assessment: No/denies pain  Home Living                                          Prior Functioning/Environment              Frequency  Min 1X/week        Progress Toward Goals  OT Goals(current goals can now be found in the care plan section)  Progress towards OT goals: Progressing toward goals  Acute Rehab OT Goals Patient Stated Goal: noe OT Goal Formulation: With patient Time For Goal Achievement: 09/03/23 Potential to Achieve Goals: Good ADL Goals Pt Will Perform Lower Body Dressing: Independently;sit to/from stand Pt Will Transfer to Toilet: Independently;ambulating Additional ADL Goal #1: Pt will demonstrate independence with medication management task.  Plan      Co-evaluation                 AM-PAC OT 6 Clicks Daily Activity     Outcome Measure   Help from another person eating meals?: None Help from another person taking care of personal grooming?: A Little Help from another person toileting, which includes using toliet, bedpan, or urinal?: A Little Help from another person bathing (including washing, rinsing, drying)?: A Little Help from another person to put on and taking off regular upper body clothing?: A Little Help from another person to put on and taking off  regular lower body clothing?: A Little 6 Click Score: 19    End of Session Equipment Utilized During Treatment: Gait belt;Rolling walker (2 wheels)  OT Visit Diagnosis: Other abnormalities of gait and mobility (R26.89);Muscle weakness (generalized) (M62.81);Other symptoms and signs involving cognitive function   Activity Tolerance Patient tolerated treatment well;Other (comment) (hr, urination)   Patient Left in chair;with call bell/phone within reach;with chair alarm set   Nurse Communication Other (comment) (cognition)        Time: 9161-9081 OT Time Calculation (min): 40 min  Charges: OT General Charges $OT Visit: 1 Visit OT Treatments $Self Care/Home Management : 38-52 mins  Warrick POUR OTR/L  Acute Rehab Services  [phone] office number   Warrick Berber 08/22/2023, 9:33 AM

## 2023-08-22 NOTE — Plan of Care (Signed)
  Problem: Education: Goal: Knowledge of General Education information will improve Description: Including pain rating scale, medication(s)/side effects and non-pharmacologic comfort measures Outcome: Progressing   Problem: Health Behavior/Discharge Planning: Goal: Ability to manage health-related needs will improve Outcome: Progressing   Problem: Clinical Measurements: Goal: Will remain free from infection Outcome: Progressing Goal: Diagnostic test results will improve Outcome: Progressing Goal: Cardiovascular complication will be avoided Outcome: Progressing   Problem: Activity: Goal: Risk for activity intolerance will decrease Outcome: Progressing   Problem: Elimination: Goal: Will not experience complications related to bowel motility Outcome: Progressing

## 2023-08-23 ENCOUNTER — Other Ambulatory Visit (HOSPITAL_COMMUNITY): Payer: Self-pay

## 2023-08-23 DIAGNOSIS — G9341 Metabolic encephalopathy: Secondary | ICD-10-CM

## 2023-08-23 DIAGNOSIS — Z87898 Personal history of other specified conditions: Secondary | ICD-10-CM | POA: Diagnosis not present

## 2023-08-23 DIAGNOSIS — I10 Essential (primary) hypertension: Secondary | ICD-10-CM

## 2023-08-23 DIAGNOSIS — N179 Acute kidney failure, unspecified: Secondary | ICD-10-CM | POA: Diagnosis not present

## 2023-08-23 LAB — RENAL FUNCTION PANEL
Albumin: 2.3 g/dL — ABNORMAL LOW (ref 3.5–5.0)
Anion gap: 12 (ref 5–15)
BUN: 22 mg/dL (ref 8–23)
CO2: 28 mmol/L (ref 22–32)
Calcium: 7.9 mg/dL — ABNORMAL LOW (ref 8.9–10.3)
Chloride: 103 mmol/L (ref 98–111)
Creatinine, Ser: 1.19 mg/dL — ABNORMAL HIGH (ref 0.44–1.00)
GFR, Estimated: 45 mL/min — ABNORMAL LOW (ref >60)
Glucose, Bld: 150 mg/dL — ABNORMAL HIGH (ref 70–99)
Phosphorus: 2.3 mg/dL — ABNORMAL LOW (ref 2.5–4.6)
Potassium: 3.9 mmol/L (ref 3.5–5.1)
Sodium: 143 mmol/L (ref 135–145)

## 2023-08-23 LAB — CULTURE, BLOOD (ROUTINE X 2)
Culture: NO GROWTH
Culture: NO GROWTH
Special Requests: ADEQUATE
Special Requests: ADEQUATE

## 2023-08-23 MED ORDER — AMLODIPINE BESYLATE 5 MG PO TABS
5.0000 mg | ORAL_TABLET | Freq: Every day | ORAL | 1 refills | Status: AC
  Filled 2023-08-23: qty 30, 30d supply, fill #0

## 2023-08-23 NOTE — Discharge Summary (Signed)
 PATIENT DETAILS Name: Glenda Blankenship Age: 83 y.o. Sex: female Date of Birth: Aug 11, 1940 MRN: [id number]. Admitting Physician: Subrina Sundil, MD ERE:Uzmmb, Norman, NP  Admit Date: 08/18/2023 Discharge date: 08/23/2023  Recommendations for Outpatient Follow-up:  Follow up with PCP in 1-2 weeks Please obtain CMP/CBC in one week  Admitted From:  Home  Disposition: Home health   Discharge Condition: good  CODE STATUS:   Code Status: Full Code   Diet recommendation:  Diet Order             Diet - low sodium heart healthy           Diet Heart Room service appropriate? Yes; Fluid consistency: Thin  Diet effective now                    Brief Summary: Patient is a 83 y.o.  female with history of CKD stage IIIa, HTN, seizure disorder, VTE on Eliquis -presenting with weakness/confusion/poor oral intake/recent diarrheal illness-found to have AKI with hyperkalemia and metabolic acidosis.   Significant events: 6/20>> admit to [facility]   Significant studies: 6/20>> CT abdomen/pelvis: No acute pathology.   Significant microbiology data: 6/20>> blood cultures: No growth   Procedures: None   Consults: Nephrology  Brief Hospital Course: AKI on CKD stage IIIa AKI hemodynamically mediated in the setting of recent GI illness/ARB/torsemide use Creatinine has rapidly improved-close to baseline. Nephrology has signed off 6/22.   Metabolic acidosis Secondary to AKI Resolved with improvement in AKI/IV fluids with bicarb   Hyperkalemia Secondary to AKI/ARB use Resolved with improvement in renal function   Hypernatremia Secondary to poor oral intake Resolved with IVF and increased oral intake.   Acute metabolic encephalopathy Secondary to AKI/dehydration Awake and alert this morning-significantly better-suspect now back to baseline.   Gastroenteritis Improved-no diarrhea but did have 1 episode of vomiting on 6/22 Since diarrhea has resolved-stool studies never  sent Continue antiemetics and provide supportive care.   Normocytic anemia Likely due to combination of underlying CKD and acute illness Monitor Hb.   History of seizure disorder Has been off Keppra  since 2023 Monitor   HTN BP stable on amlodipine  Hold Benicar/torsemide given resolving AKI-this can be resumed in the outpatient setting at PCPs discretion.   History of VTE On Eliquis    Debility/deconditioning PT/OT eval-Home health recommended.   Discharge Diagnoses:  Principal Problem:   Acute kidney injury superimposed on CKD (HCC) Active Problems:   High anion gap metabolic acidosis   Hyperkalemia   Diarrhea   Gastroenteritis   Essential hypertension   History of seizure   History of DVT (deep vein thrombosis)   History of pulmonary embolism   Elevated lipase   Acute metabolic encephalopathy   Acute kidney injury superimposed on chronic kidney disease [facility])   Discharge Instructions:  Activity:  As tolerated with Full fall precautions use walker/cane & assistance as needed  Discharge Instructions     Diet - low sodium heart healthy   Complete by: As directed    Discharge instructions   Complete by: As directed    Follow with Primary MD  Jerel Norman, NP in 1-2 weeks  Olmesartan/Demadex are currently on hold due to resolving acute kidney injury-you have been started on amlodipine  for your blood pressure.  Please ask your primary care practitioner whether or not you should resume olmesartan/Demadex at your next appointment.  Please get a complete blood count and chemistry panel checked by your Primary MD at your next visit, and again as instructed  by your Primary MD.  Get Medicines reviewed and adjusted: Please take all your medications with you for your next visit with your Primary MD  Laboratory/radiological data: Please request your Primary MD to go over all hospital tests and procedure/radiological results at the follow up, please ask your Primary MD to  get all Hospital records sent to his/her office.  In some cases, they will be blood work, cultures and biopsy results pending at the time of your discharge. Please request that your primary care M.D. follows up on these results.  Also Note the following: If you experience worsening of your admission symptoms, develop shortness of breath, life threatening emergency, suicidal or homicidal thoughts you must seek medical attention immediately by calling 911 or calling your MD immediately  if symptoms less severe.  You must read complete instructions/literature along with all the possible adverse reactions/side effects for all the Medicines you take and that have been prescribed to you. Take any new Medicines after you have completely understood and accpet all the possible adverse reactions/side effects.   Do not drive when taking Pain medications or sleeping medications (Benzodaizepines)  Do not take more than prescribed Pain, Sleep and Anxiety Medications. It is not advisable to combine anxiety,sleep and pain medications without talking with your primary care practitioner  Special Instructions: If you have smoked or chewed Tobacco  in the last 2 yrs please stop smoking, stop any regular Alcohol  and or any Recreational drug use.  Wear Seat belts while driving.  Please note: You were cared for by a hospitalist during your hospital stay. Once you are discharged, your primary care physician will handle any further medical issues. Please note that NO REFILLS for any discharge medications will be authorized once you are discharged, as it is imperative that you return to your primary care physician (or establish a relationship with a primary care physician if you do not have one) for your post hospital discharge needs so that they can reassess your need for medications and monitor your lab values.   Increase activity slowly   Complete by: As directed       Allergies as of 08/23/2023   No Known  Allergies      Medication List     STOP taking these medications    olmesartan 40 MG tablet Commonly known as: BENICAR   torsemide 10 MG tablet Commonly known as: DEMADEX       TAKE these medications    allopurinol  100 MG tablet Commonly known as: ZYLOPRIM  Take 100 mg by mouth daily.   amLODipine  5 MG tablet Commonly known as: NORVASC  Take 1 tablet (5 mg total) by mouth daily.   apixaban  5 MG Tabs tablet Commonly known as: Eliquis  Take 1 tablet (5 mg total) by mouth 2 (two) times daily.   Multivitamin Women 50+ Tabs Take 1 tablet by mouth daily.        Follow-up Information     [facility] Follow up.   Specialty: Home Health Services Why: [facility] will provide home health PT and OT and will contact you within 48 hours of DC. Contact information: [address] [place] [address] [phone]         Jerel Gee, NP. Schedule an appointment as soon as possible for a visit in 1 week(s).   Specialty: Nurse Practitioner Contact information: [address] [place] [address] [phone]                No  Known Allergies   Other Procedures/Studies: CT Renal Stone Study Result Date: 08/18/2023 CLINICAL DATA:  Renal infarction, dysuria EXAM: CT ABDOMEN AND PELVIS WITHOUT CONTRAST TECHNIQUE: Multidetector CT imaging of the abdomen and pelvis was performed following the standard protocol without IV contrast. RADIATION DOSE REDUCTION: This exam was performed according to the departmental dose-optimization program which includes automated exposure control, adjustment of the mA and/or kV according to patient size and/or use of iterative reconstruction technique. COMPARISON:  01/08/2016 FINDINGS: Lower chest: No acute abnormality. Hepatobiliary: No focal liver abnormality is seen. No gallstones, gallbladder wall thickening, or biliary dilatation. Pancreas: Unremarkable Spleen: Unremarkable Adrenals/Urinary Tract: The  adrenal glands are unremarkable. The kidneys are normal in size and position. 4 mm crescentic nonobstructing calculus is seen within the lower pole of the right kidney. The kidneys are otherwise unremarkable on this noncontrast examination. Small congenital right posterolateral bladder diverticulum. The bladder is otherwise unremarkable. Stomach/Bowel: Stomach is within normal limits. Appendix appears normal. No evidence of bowel wall thickening, distention, or inflammatory changes. Vascular/Lymphatic: Aortic atherosclerosis. No enlarged abdominal or pelvic lymph nodes. Reproductive: Multiple calcified involuted fibroids are noted within the uterus. The pelvic organs are otherwise unremarkable. Other: No abdominal wall hernia or abnormality. No abdominopelvic ascites. Musculoskeletal: No acute bone abnormality. No lytic or blastic bone lesion. Osseous structures are age appropriate. IMPRESSION: 1. No acute intra-abdominal pathology identified. Minimal nonobstructing right nephrolithiasis. Note that renal perfusion is not well assessed on this noncontrast examination. 2. Aortic atherosclerosis. Aortic Atherosclerosis (ICD10-I70.0). Electronically Signed   By: Dorethia Molt M.D.   On: 08/18/2023 23:55   DG Chest Port 1 View Result Date: 08/18/2023 CLINICAL DATA:  Cough EXAM: PORTABLE CHEST 1 VIEW COMPARISON:  01/26/2018 FINDINGS: No acute airspace disease or effusion. Stable cardiomediastinal silhouette with aortic atherosclerosis. Chronic right-sided rib deformities. IMPRESSION: No active disease. Electronically Signed   By: Luke Bun M.D.   On: 08/18/2023 22:15     TODAY-DAY OF DISCHARGE:  Subjective:   Loretto Belinsky today has no headache,no chest abdominal pain,no new weakness tingling or numbness, feels much better wants to go home today.   Objective:   Blood pressure (!) 136/57, pulse (!) 105, temperature 98.2 F (36.8 C), temperature source Oral, resp. rate 20, height 5' 2 (1.575 m), weight  72.6 kg, SpO2 96%.  Intake/Output Summary (Last 24 hours) at 08/23/2023 0819 Last data filed at 08/22/2023 1300 Gross per 24 hour  Intake 520 ml  Output --  Net 520 ml   Filed Weights   08/18/23 2031  Weight: 72.6 kg    Exam: Awake Alert, Oriented *3, No new F.N deficits, Normal affect [place].AT,PERRAL Supple Neck,No JVD, No cervical lymphadenopathy appriciated.  Symmetrical Chest wall movement, Good air movement bilaterally, CTAB RRR,No Gallops,Rubs or new Murmurs, No Parasternal Heave +ve B.Sounds, Abd Soft, Non tender, No organomegaly appriciated, No rebound -guarding or rigidity. No Cyanosis, Clubbing or edema, No new Rash or bruise   PERTINENT RADIOLOGIC STUDIES: No results found.   PERTINENT LAB RESULTS: CBC: No results for input(s): WBC, HGB, HCT, PLT in the last 72 hours. CMET CMP     Component Value Date/Time   NA 143 08/23/2023 0345   K 3.9 08/23/2023 0345   CL 103 08/23/2023 0345   CO2 28 08/23/2023 0345   GLUCOSE 150 (H) 08/23/2023 0345   BUN 22 08/23/2023 0345   CREATININE 1.19 (H) 08/23/2023 0345   CALCIUM  7.9 (L) 08/23/2023 0345   PROT 6.5 08/19/2023 0455   ALBUMIN 2.3 (L) 08/23/2023  0345   AST 17 08/19/2023 0455   ALT 20 08/19/2023 0455   ALKPHOS 50 08/19/2023 0455   BILITOT 0.5 08/19/2023 0455   GFR 60.71 12/15/2016 1129   GFRNONAA 45 (L) 08/23/2023 0345    GFR Estimated Creatinine Clearance: 33.4 mL/min (A) (by C-G formula based on SCr of 1.19 mg/dL (H)). No results for input(s): LIPASE, AMYLASE in the last 72 hours. No results for input(s): CKTOTAL, CKMB, CKMBINDEX, TROPONINI in the last 72 hours. Invalid input(s): POCBNP No results for input(s): DDIMER in the last 72 hours. No results for input(s): HGBA1C in the last 72 hours. No results for input(s): CHOL, HDL, LDLCALC, TRIG, CHOLHDL, LDLDIRECT in the last 72 hours. No results for input(s): TSH, T4TOTAL, T3FREE, THYROIDAB in the last 72  hours.  Invalid input(s): FREET3 No results for input(s): VITAMINB12, FOLATE, FERRITIN, TIBC, IRON, RETICCTPCT in the last 72 hours. Coags: No results for input(s): INR in the last 72 hours.  Invalid input(s): PT Microbiology: Recent Results (from the past 240 hours)  Culture, blood (routine x 2)     Status: None (Preliminary result)   Collection Time: 08/18/23  9:23 PM   Specimen: BLOOD  Result Value Ref Range Status   Specimen Description BLOOD RIGHT ANTECUBITAL  Final   Special Requests   Final    BOTTLES DRAWN AEROBIC AND ANAEROBIC Blood Culture adequate volume   Culture   Final    NO GROWTH 4 DAYS Performed at [facility], [address], [place], [place] [address]    Report Status PENDING  Incomplete  Culture, blood (routine x 2)     Status: None (Preliminary result)   Collection Time: 08/18/23  9:43 PM   Specimen: BLOOD  Result Value Ref Range Status   Specimen Description BLOOD LEFT ANTECUBITAL  Final   Special Requests   Final    BOTTLES DRAWN AEROBIC AND ANAEROBIC Blood Culture adequate volume   Culture   Final    NO GROWTH 4 DAYS Performed at [facility], [address], [place], [place] [address]    Report Status PENDING  Incomplete    FURTHER DISCHARGE INSTRUCTIONS:  Get Medicines reviewed and adjusted: Please take all your medications with you for your next visit with your Primary MD  Laboratory/radiological data: Please request your Primary MD to go over all hospital tests and procedure/radiological results at the follow up, please ask your Primary MD to get all Hospital records sent to his/her office.  In some cases, they will be blood work, cultures and biopsy results pending at the time of your discharge. Please request that your primary care M.D. goes through all the records of your hospital data and follows up on these results.  Also Note the following: If you experience worsening of your admission symptoms, develop  shortness of breath, life threatening emergency, suicidal or homicidal thoughts you must seek medical attention immediately by calling 911 or calling your MD immediately  if symptoms less severe.  You must read complete instructions/literature along with all the possible adverse reactions/side effects for all the Medicines you take and that have been prescribed to you. Take any new Medicines after you have completely understood and accpet all the possible adverse reactions/side effects.   Do not drive when taking Pain medications or sleeping medications (Benzodaizepines)  Do not take more than prescribed Pain, Sleep and Anxiety Medications. It is not advisable to combine anxiety,sleep and pain medications without talking with your primary care practitioner  Special  Instructions: If you have smoked or chewed Tobacco  in the last 2 yrs please stop smoking, stop any regular Alcohol  and or any Recreational drug use.  Wear Seat belts while driving.  Please note: You were cared for by a hospitalist during your hospital stay. Once you are discharged, your primary care physician will handle any further medical issues. Please note that NO REFILLS for any discharge medications will be authorized once you are discharged, as it is imperative that you return to your primary care physician (or establish a relationship with a primary care physician if you do not have one) for your post hospital discharge needs so that they can reassess your need for medications and monitor your lab values.  Total Time spent coordinating discharge including counseling, education and face to face time equals greater than 30 minutes.  SignedBETHA Donalda Applebaum 08/23/2023 8:19 AM

## 2023-08-23 NOTE — TOC Transition Note (Signed)
 Transition of Care [facility]) - Discharge Note   Patient Details  Name: Glenda Blankenship MRN: [id number] Date of Birth: 1940/10/20  [facility] CM/SW Contact:  Hendricks KANDICE Her, RN Phone Number: 08/23/2023, 8:48 AM   Clinical Narrative:     Patient will DC to home today. No TOC needs identified. Patient will follow up as directed on AVS              Patient Goals and CMS Choice            Discharge Placement                       Discharge Plan and Services Additional resources added to the After Visit Summary for                                       Social Drivers of Health (SDOH) Interventions SDOH Screenings   Food Insecurity: Patient Declined (08/20/2023)  Housing: Patient Declined (08/20/2023)  Transportation Needs: Patient Declined (08/20/2023)  Utilities: Patient Declined (08/20/2023)  Tobacco Use: Medium Risk (08/19/2023)     Readmission Risk Interventions     No data to display

## 2023-08-23 NOTE — Plan of Care (Signed)

## 2023-08-23 NOTE — Progress Notes (Signed)
 Physical Therapy Treatment Patient Details Name: Glenda Blankenship MRN: [id number] DOB: 07-13-1940 Today's Date: 08/23/2023   History of Present Illness Glenda Blankenship is a 83 y.o. 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  Patient is presenting to emergency department via EMS with complaints of generalized weakness and dysuria for 3 weeks.    PT Comments  Pt required supervision bed mobility, supervision transfers, and supervision amb 100' without AD. Pt assisted to/from bathroom and set up for breakfast in recliner at end of session. Current POC remains appropriate.    If plan is discharge home, recommend the following: Assistance with cooking/housework;Assist for transportation   Can travel by private vehicle        Equipment Recommendations  None recommended by PT    Recommendations for Other Services       Precautions / Restrictions Precautions Precautions: Fall;Other (comment) Precaution/Restrictions Comments: Watch HR     Mobility  Bed Mobility Overal bed mobility: Needs Assistance Bed Mobility: Supine to Sit     Supine to sit: Supervision, HOB elevated, Used rails          Transfers Overall transfer level: Needs assistance Equipment used: None Transfers: Sit to/from Stand Sit to Stand: Supervision           General transfer comment: increased time    Ambulation/Gait Ambulation/Gait assistance: Supervision Gait Distance (Feet): 100 Feet Assistive device: None Gait Pattern/deviations: Step-through pattern, Decreased stride length Gait velocity: decreased Gait velocity interpretation: 1.31 - 2.62 ft/sec, indicative of limited community ambulator   General Gait Details: occasional furniture walking and use of handrail   Stairs             Wheelchair Mobility     Tilt Bed    Modified Rankin (Stroke Patients Only)       Balance Overall balance  assessment: Mild deficits observed, not formally tested                                          Communication Communication Communication: No apparent difficulties  Cognition Arousal: Alert Behavior During Therapy: Flat affect   PT - Cognitive impairments: No family/caregiver present to determine baseline                         Following commands: Intact      Cueing Cueing Techniques: Verbal cues  Exercises      General Comments General comments (skin integrity, edema, etc.): HR in 110s during activity      Pertinent Vitals/Pain Pain Assessment Pain Assessment: No/denies pain    Home Living                          Prior Function            PT Goals (current goals can now be found in the care plan section) Acute Rehab PT Goals Patient Stated Goal: home Progress towards PT goals: Progressing toward goals    Frequency    Min 2X/week      PT Plan      Co-evaluation              AM-PAC PT 6 Clicks Mobility   Outcome Measure  Help needed turning from your back to your side while in a flat  bed without using bedrails?: None Help needed moving from lying on your back to sitting on the side of a flat bed without using bedrails?: A Little Help needed moving to and from a bed to a chair (including a wheelchair)?: A Little Help needed standing up from a chair using your arms (e.g., wheelchair or bedside chair)?: A Little Help needed to walk in hospital room?: A Little Help needed climbing 3-5 steps with a railing? : A Little 6 Click Score: 19    End of Session Equipment Utilized During Treatment: Gait belt Activity Tolerance: Patient tolerated treatment well Patient left: in chair;with chair alarm set;with call bell/phone within reach Nurse Communication: Mobility status PT Visit Diagnosis: Unsteadiness on feet (R26.81)     Time: 9261-9247 PT Time Calculation (min) (ACUTE ONLY): 14 min  Charges:    $Gait  Training: 8-22 mins PT General Charges $$ ACUTE PT VISIT: 1 Visit                     Sari MATSU., PT  Office # [phone]    Erven Sari Shaker 08/23/2023, 8:36 AM
